# Patient Record
Sex: Male | Born: 1935 | Race: White | Hispanic: No | Smoking: Former smoker
Health system: Southern US, Community
[De-identification: ages and names within clinical notes are randomized; demographics above are authoritative.]

## PROBLEM LIST (undated history)

## (undated) DIAGNOSIS — I639 Cerebral infarction, unspecified: Secondary | ICD-10-CM

## (undated) DIAGNOSIS — I350 Nonrheumatic aortic (valve) stenosis: Secondary | ICD-10-CM

## (undated) DIAGNOSIS — IMO0002 Reserved for concepts with insufficient information to code with codable children: Secondary | ICD-10-CM

## (undated) DIAGNOSIS — R399 Unspecified symptoms and signs involving the genitourinary system: Secondary | ICD-10-CM

## (undated) DIAGNOSIS — E7800 Pure hypercholesterolemia, unspecified: Secondary | ICD-10-CM

## (undated) DIAGNOSIS — Z8673 Personal history of transient ischemic attack (TIA), and cerebral infarction without residual deficits: Secondary | ICD-10-CM

## (undated) DIAGNOSIS — Z85038 Personal history of other malignant neoplasm of large intestine: Secondary | ICD-10-CM

## (undated) DIAGNOSIS — K859 Acute pancreatitis without necrosis or infection, unspecified: Secondary | ICD-10-CM

## (undated) DIAGNOSIS — I10 Essential (primary) hypertension: Secondary | ICD-10-CM

## (undated) DIAGNOSIS — L570 Actinic keratosis: Secondary | ICD-10-CM

## (undated) DIAGNOSIS — I4891 Unspecified atrial fibrillation: Secondary | ICD-10-CM

## (undated) HISTORY — PX: CHOLECYSTECTOMY: SHX55

## (undated) HISTORY — DX: Essential (primary) hypertension: I10

## (undated) HISTORY — PX: PACEMAKER IMPLANT: EP1218

## (undated) HISTORY — PX: COLON RESECTION: SHX5231

## (undated) HISTORY — DX: Unspecified symptoms and signs involving the genitourinary system: R39.9

## (undated) HISTORY — PX: MOHS SURGERY: SUR867

## (undated) HISTORY — DX: Actinic keratosis: L57.0

## (undated) HISTORY — DX: Acute pancreatitis without necrosis or infection, unspecified: K85.90

## (undated) HISTORY — PX: ABDOMINAL SURGERY: SHX537

## (undated) HISTORY — DX: Personal history of other malignant neoplasm of large intestine: Z85.038

## (undated) HISTORY — DX: Reserved for concepts with insufficient information to code with codable children: IMO0002

## (undated) HISTORY — DX: Personal history of transient ischemic attack (TIA), and cerebral infarction without residual deficits: Z86.73

## (undated) HISTORY — PX: VALVE REPLACEMENT: SUR13

## (undated) HISTORY — DX: Nonrheumatic aortic (valve) stenosis: I35.0

---

## 2018-01-07 ENCOUNTER — Ambulatory Visit (HOSPITAL_COMMUNITY)
Admission: EM | Admit: 2018-01-07 | Discharge: 2018-01-07 | Disposition: A | Discharge status: Home / Self Care | Payer: Medicare Other | Attending: Family Medicine | Admitting: Family Medicine

## 2018-01-07 ENCOUNTER — Encounter (HOSPITAL_COMMUNITY): Payer: Self-pay | Admitting: Emergency Medicine

## 2018-01-07 DIAGNOSIS — Z5189 Encounter for other specified aftercare: Secondary | ICD-10-CM | POA: Diagnosis not present

## 2018-01-07 DIAGNOSIS — S59901A Unspecified injury of right elbow, initial encounter: Secondary | ICD-10-CM | POA: Diagnosis not present

## 2018-01-07 HISTORY — DX: Pure hypercholesterolemia, unspecified: E78.00

## 2018-01-07 HISTORY — DX: Unspecified atrial fibrillation: I48.91

## 2018-01-07 NOTE — Discharge Instructions (Signed)
Your wound is healing well. No signs on infection. Steristrips removed today. You can remove current dressing tomorrow morning. You do not have to dress it throughout the day, but can dress it at bedtime to prevent further irritation to the area. Clean area gently with soap and water, allow dried blood to fall off on own to avoid opening up the wound. Follow up here or with PCP for recheck as needed. If experiencing worsening symptoms, spreading redness, increased warmth, purulent drainage, fever, follow up for reevaluation needed.

## 2018-01-07 NOTE — ED Provider Notes (Signed)
[facility]    CSN: [id number] Arrival date & time: 01/07/18  1122     History [id number]  Chief Complaint Chief Complaint  Patient presents with  . Wound Check    HPI Andrew Soto is a 84 y.o. male.   84 year old male with history of A. fib, HLD comes in for evaluation of right elbow wound.  States a week ago, fell in the apartment, and hit the right elbow.  At that time, he went to another facility for evaluation.  Was repaired with Steri-Strips, and wrapped with coband. He has been dressing wound, but given the appearance of the wound, has been evaluated 2 more times at the same facility.  States given continued bruising around the wound, Steri-Strips, came in for evaluation.  He denies spreading erythema, increased warmth, fever.  There is soreness to the wound, but denies increase in pain.  Denies fever, chills, night sweats.     Past Medical History:  Diagnosis Date  . Atrial fibrillation (HCC)   . High cholesterol   . Stroke [facility](HCC)     There are no active problems to display for this patient.   Past Surgical History:  Procedure Laterality Date  . ABDOMINAL SURGERY    . COLON RESECTION    . PACEMAKER IMPLANT         Home Medications    Prior to Admission medications   Medication Sig Start Date End Date Taking? Authorizing Provider  AMIODARONE HCL PO Take by mouth.    [provider]  aspirin 81 MG tablet Take by mouth.    [provider]  Cholecalciferol 1000 UNIT/10ML LIQD Take by mouth.    [provider]  cyanocobalamin ([employer] VITAMIN B12) 1000 MCG tablet Take by mouth.    [provider]  ezetimibe (ZETIA) 10 MG tablet Take by mouth.    [provider]  tamsulosin (FLOMAX) 0.4 MG CAPS capsule Take by mouth.    [provider]    Family History No family history on file.  Social History Social History   Tobacco Use  . Smoking status: Never Smoker  Substance Use Topics  . Alcohol use: Yes  .  Drug use: Never     Allergies   Epinephrine and Penicillins   Review of Systems Review of Systems  Reason unable to perform ROS: See HPI as above.     Physical Exam Triage Vital Signs ED Triage Vitals  Enc Vitals Group     BP 01/07/18 1217 (!) 171/55     Pulse Rate 01/07/18 1217 60     Resp 01/07/18 1217 18     Temp 01/07/18 1217 97.9 F (36.6 C)     Temp src --      SpO2 01/07/18 1217 100 %     Weight --      Height --      Head Circumference --      Peak Flow --      Pain Score 01/07/18 1220 0     Pain Loc --      Pain Edu? --      Excl. in GC? --    No data found.  Updated Vital Signs BP (!) 171/55   Pulse 60   Temp 97.9 F (36.6 C)   Resp 18   SpO2 100%   Physical Exam  Constitutional: He is oriented to person, place, and time. He appears well-developed and well-nourished. No distress.  HENT:  Head: Normocephalic and atraumatic.  Eyes: Pupils are equal, round, and reactive to light. Conjunctivae are normal.  Musculoskeletal:  Contusion along right elbow without swelling, erythema, warmth.  There is 2 cm of dried blood with 3 Steri-Strips.  No obvious wound seen.  No tenderness to palpation.  Full range of motion of the elbow.  Neurological: He is alert and oriented to person, place, and time.  Skin: He is not diaphoretic.   UC Treatments / Results  Labs (all labs ordered are listed, but only abnormal results are displayed) Labs Reviewed - No data to display  EKG None  Radiology No results found.  Procedures Procedures (including critical care time)  Medications Ordered in UC Medications - No data to display  Initial Impression / Assessment and Plan / UC Course  I have reviewed the triage vital signs and the nursing notes.  Pertinent labs & imaging results that were available during my care of the patient were reviewed by me and considered in my medical decision making (see chart for details).    Steri-Strips removed.  No obvious wound  seen, however, discussed with patient given on blood thinners, will avoid removing dried scab, as it can start bleeding.  Surgicel applied to avoid increase in bleeding.  Patient has had discomfort with co-band, chest wound with nonadhesive gauze, and wrapped with Ace wrap.  Wound care instructions provided.  Return precautions given.  Patient expresses understanding and agrees to plan.  Final Clinical Impressions(s) / UC Diagnoses   Final diagnoses:  Visit for wound check    ED Prescriptions    None       Belinda Fisher, PA-C 01/07/18 1901

## 2018-01-07 NOTE — ED Triage Notes (Signed)
Pt states a week ago he fell at his apartment, states he is unsure how he fell, he states he had a drawer open and he fell on to his back, his R arm caught the sharp edge of the drawer and tore his skin. Pt went to [facility] for the skin tear. Pt states "its just not done well, the wrapping let the blood soak through and I got blood on my shirt". Three steri strips applied, no bleeding.

## 2018-02-02 ENCOUNTER — Ambulatory Visit (HOSPITAL_COMMUNITY)
Admission: EM | Admit: 2018-02-02 | Discharge: 2018-02-02 | Disposition: A | Discharge status: Home / Self Care | Payer: Medicare Other | Attending: Family Medicine | Admitting: Family Medicine

## 2018-02-02 ENCOUNTER — Encounter (HOSPITAL_COMMUNITY): Payer: Self-pay | Admitting: Emergency Medicine

## 2018-02-02 DIAGNOSIS — Z760 Encounter for issue of repeat prescription: Secondary | ICD-10-CM | POA: Diagnosis not present

## 2018-02-02 DIAGNOSIS — Z79899 Other long term (current) drug therapy: Secondary | ICD-10-CM | POA: Diagnosis not present

## 2018-02-02 NOTE — ED Provider Notes (Signed)
[facility]    CSN: [id number] Arrival date & time: 02/02/18  1216     History   Chief Complaint Chief Complaint  Patient presents with  . Medication Refill    HPI Andrew Soto is a 84 y.o. male.   Andrew Soto presents today for advice for medication management and establishment with a primary care provider. He moved here from [place] two months ago and is living in an independent senior living. He has a son who lives here locally. He had previously used an [facility] service. States he does not wish to pursue this option here. He had a nice experience in this urgent care when was seen for a wound check so questions if we could manage he medications. His previous doctor states if needed she can manage all but his amiodorone, which she states he needs to follow with cardiology. He has not yet established with a cardiologist. He states he has a few weeks of medications left. He has a pacemaker, hx of afib, stroke. He denies any current symptoms. He is ambulatory with a cane. He states he has bilateral knee arthritis.     ROS per HPI.      Past Medical History:  Diagnosis Date  . Atrial fibrillation (HCC)   . High cholesterol   . Stroke [facility](HCC)     There are no active problems to display for this patient.   Past Surgical History:  Procedure Laterality Date  . ABDOMINAL SURGERY    . COLON RESECTION    . PACEMAKER IMPLANT         Home Medications    Prior to Admission medications   Medication Sig Start Date End Date Taking? Authorizing Provider  AMIODARONE HCL PO Take 200 mg by mouth daily.    Yes [provider]  aspirin 81 MG tablet Take by mouth daily.    Yes [provider]  Cholecalciferol 1000 UNIT/10ML LIQD Take by mouth.   Yes [provider]  cyanocobalamin ([employer] VITAMIN B12) 1000 MCG tablet Take by mouth.   Yes [provider]  ezetimibe (ZETIA) 10 MG tablet Take by mouth daily.    Yes [provider]  tamsulosin  (FLOMAX) 0.4 MG CAPS capsule Take by mouth daily.     [provider]    Family History No family history on file.  Social History Social History   Tobacco Use  . Smoking status: Never Smoker  Substance Use Topics  . Alcohol use: Yes  . Drug use: Never     Allergies   Epinephrine and Penicillins   Review of Systems Review of Systems   Physical Exam Triage Vital Signs ED Triage Vitals  Enc Vitals Group     BP 02/02/18 1256 (!) 173/75     Pulse Rate 02/02/18 1256 60     Resp 02/02/18 1256 16     Temp 02/02/18 1256 (!) 97.4 F (36.3 C)     Temp src --      SpO2 02/02/18 1256 100 %     Weight --      Height --      Head Circumference --      Peak Flow --      Pain Score 02/02/18 1258 0     Pain Loc --      Pain Edu? --      Excl. in GC? --    No data found.  Updated Vital Signs BP (!) 173/75   Pulse 60  Temp (!) 97.4 F (36.3 C)   Resp 16   SpO2 100%    Physical Exam  Constitutional: He is oriented to person, place, and time. He appears well-developed and well-nourished.  HENT:  Head: Normocephalic and atraumatic.  Neurological: He is alert and oriented to person, place, and time.  Skin: Skin is warm and dry.  Wounds to right arm healed  Vitals reviewed.    UC Treatments / Results  Labs (all labs ordered are listed, but only abnormal results are displayed) Labs Reviewed - No data to display  EKG None  Radiology No results found.  Procedures Procedures (including critical care time)  Medications Ordered in UC Medications - No data to display  Initial Impression / Assessment and Plan / UC Course  I have reviewed the triage vital signs and the nursing notes.  Pertinent labs & imaging results that were available during my care of the patient were reviewed by me and considered in my medical decision making (see chart for details).    20 minutes spent counseling and discussing plan of care and options with patient. Not out of  meds, has a few weeks still. Options for PCP, cardiology, orthopedics provided. Patient info to care team for PCP assist. Patient verbalized understanding and agreeable to plan.   Ambulatory out of clinic without difficulty.    Final Clinical Impressions(s) / UC Diagnoses   Final diagnoses:  Medication management     Discharge Instructions     I have provided the information for the family practice clinic near here, however, there are multiple different [place] family and internal medicine practices. You may also look on the [facility] website to see if there is a clinic you prefer.  I have provided information for cardiology and orthopedists as well.  I have provided your information to our care team who will call you in the next week or so to help connect you with a primary care provider.    ED Prescriptions    None     Controlled Substance Prescriptions [place] Controlled Substance Registry consulted? Not Applicable   Georgetta Haber, NP 02/02/18 1355

## 2018-02-02 NOTE — Discharge Instructions (Signed)
I have provided the information for the family practice clinic near here, however, there are multiple different [place] family and internal medicine practices. You may also look on the [facility] website to see if there is a clinic you prefer.  I have provided information for cardiology and orthopedists as well.  I have provided your information to our care team who will call you in the next week or so to help connect you with a primary care provider.

## 2018-02-02 NOTE — ED Triage Notes (Signed)
Pt here for medication refill, pt states he is unable to find a PCP and he has one week left of one of his meds.

## 2018-02-09 ENCOUNTER — Ambulatory Visit: Payer: Self-pay | Admitting: Emergency Medicine

## 2018-02-13 ENCOUNTER — Encounter (HOSPITAL_COMMUNITY): Payer: Self-pay | Admitting: Emergency Medicine

## 2018-02-13 ENCOUNTER — Ambulatory Visit (HOSPITAL_COMMUNITY)
Admission: EM | Admit: 2018-02-13 | Discharge: 2018-02-13 | Disposition: A | Discharge status: Home / Self Care | Payer: Medicare Other | Attending: Family Medicine | Admitting: Family Medicine

## 2018-02-13 DIAGNOSIS — I4891 Unspecified atrial fibrillation: Secondary | ICD-10-CM

## 2018-02-13 DIAGNOSIS — Z760 Encounter for issue of repeat prescription: Secondary | ICD-10-CM

## 2018-02-13 MED ORDER — EZETIMIBE 10 MG PO TABS: 10.0000 mg | ORAL_TABLET | Freq: Every day | ORAL | 0 refills | Status: DC

## 2018-02-13 MED ORDER — TAMSULOSIN HCL 0.4 MG PO CAPS: 0.4000 mg | ORAL_CAPSULE | Freq: Every day | ORAL | 0 refills | Status: DC

## 2018-02-13 MED ORDER — AMIODARONE HCL 200 MG PO TABS: 200.0000 mg | ORAL_TABLET | Freq: Every day | ORAL | 0 refills | Status: DC

## 2018-02-13 NOTE — Discharge Instructions (Signed)
It was nice meeting you!

## 2018-02-13 NOTE — ED Triage Notes (Signed)
Pt requesting refills on his medication. No symptoms.

## 2018-02-13 NOTE — ED Provider Notes (Signed)
[facility]    CSN: [id number] Arrival date & time: 02/13/18  1221     History   Chief Complaint Chief Complaint  Patient presents with  . Medication Refill    HPI Andrew Soto is a 84 y.o. male.   Pt is an 84 year old male with past medical history of A. Fib, cholesterol, stroke, pacemaker.  He is here today for medication refill.  He is recently moved from [place] a few months ago and still not established a primary care provider.  He is attempted to contact his cardiologist back in [place] for refill on the amiodarone.  He uses the [facility].  He has contacted them and they are trying to fit him in between now in November before his membership runs out.  He runs out of his medications on Saturday. He is completely asymptomatic today feeling fine. Denies any chest pain, SOB, palpations. Denies any dizziness, fatigue.      Past Medical History:  Diagnosis Date  . Atrial fibrillation (HCC)   . High cholesterol   . Stroke [facility]     There are no active problems to display for this patient.   Past Surgical History:  Procedure Laterality Date  . ABDOMINAL SURGERY    . COLON RESECTION    . PACEMAKER IMPLANT         Home Medications    Prior to Admission medications   Medication Sig Start Date End Date Taking? Authorizing Provider  amiodarone (PACERONE) 200 MG tablet Take 1 tablet (200 mg total) by mouth daily. 02/13/18   Dahlia Byes A, NP  aspirin 81 MG tablet Take by mouth daily.     [provider]  Cholecalciferol 1000 UNIT/10ML LIQD Take by mouth.    [provider]  cyanocobalamin ([employer] VITAMIN B12) 1000 MCG tablet Take by mouth.    [provider]  ezetimibe (ZETIA) 10 MG tablet Take 1 tablet (10 mg total) by mouth daily. 02/13/18   Dahlia Byes A, NP  tamsulosin (FLOMAX) 0.4 MG CAPS capsule Take 1 capsule (0.4 mg total) by mouth daily. 02/13/18   Janace Aris, NP    Family History No family history on file.  Social  History Social History   Tobacco Use  . Smoking status: Never Smoker  Substance Use Topics  . Alcohol use: Yes  . Drug use: Never     Allergies   Epinephrine and Penicillins   Review of Systems Review of Systems   Physical Exam Triage Vital Signs ED Triage Vitals  Enc Vitals Group     BP 02/13/18 1240 (!) 184/74     Pulse Rate 02/13/18 1238 62     Resp 02/13/18 1238 18     Temp 02/13/18 1238 97.6 F (36.4 C)     Temp src --      SpO2 02/13/18 1238 100 %     Weight --      Height --      Head Circumference --      Peak Flow --      Pain Score 02/13/18 1238 0     Pain Loc --      Pain Edu? --      Excl. in GC? --    No data found.  Updated Vital Signs BP (!) 184/74   Pulse 62   Temp 97.6 F (36.4 C)   Resp 18   SpO2 100%   Visual Acuity Right Eye Distance:   Left  Eye Distance:   Bilateral Distance:    Right Eye Near:   Left Eye Near:    Bilateral Near:     Physical Exam  Constitutional: He is oriented to person, place, and time. He appears well-developed and well-nourished.  Very pleasant. Non toxic or ill appearing.     HENT:  Head: Normocephalic and atraumatic.  Eyes: Conjunctivae are normal.  Neck: Normal range of motion.  Cardiovascular: Normal rate, regular rhythm and normal heart sounds.  Pulmonary/Chest:  Lungs clear in all fields. No dyspnea or distress. No retractions or nasal flaring.     Musculoskeletal: Normal range of motion.  Neurological: He is alert and oriented to person, place, and time.  Skin: Skin is warm and dry. Capillary refill takes less than 2 seconds. No pallor.  Psychiatric: He has a normal mood and affect.  Nursing note and vitals reviewed.    UC Treatments / Results  Labs (all labs ordered are listed, but only abnormal results are displayed) Labs Reviewed - No data to display  EKG None  Radiology No results found.  Procedures Procedures (including critical care time)  Medications Ordered in  UC Medications - No data to display  Initial Impression / Assessment and Plan / UC Course  I have reviewed the triage vital signs and the nursing notes.  Pertinent labs & imaging results that were available during my care of the patient were reviewed by me and considered in my medical decision making (see chart for details).     Medication refill- refilled meds due to pt running out on Saturday.  amiodarone is not something we typically refill in urgent care setting. He really needs a primary care and cardiology follow up. He is aware and hopes to follow up with PCP soon and find a cardiologist.  He is very well and asymptomatic today. No concerning finds on exam.   Final Clinical Impressions(s) / UC Diagnoses   Final diagnoses:  Medication refill     Discharge Instructions     It was nice meeting you!!      ED Prescriptions    Medication Sig Dispense Auth. Provider   amiodarone (PACERONE) 200 MG tablet Take 1 tablet (200 mg total) by mouth daily. 60 tablet Yanisa Goodgame A, NP   tamsulosin (FLOMAX) 0.4 MG CAPS capsule Take 1 capsule (0.4 mg total) by mouth daily. 60 capsule Dmya Long A, NP   ezetimibe (ZETIA) 10 MG tablet Take 1 tablet (10 mg total) by mouth daily. 60 tablet Janace Aris, NP     Controlled Substance Prescriptions [place] Controlled Substance Registry consulted? no   Janace Aris, NP 02/13/18 1934

## 2018-02-22 ENCOUNTER — Ambulatory Visit: Payer: Self-pay | Admitting: Family Medicine

## 2018-03-27 ENCOUNTER — Encounter (HOSPITAL_COMMUNITY): Payer: Self-pay | Admitting: Emergency Medicine

## 2018-03-27 ENCOUNTER — Other Ambulatory Visit: Payer: Self-pay

## 2018-03-27 ENCOUNTER — Emergency Department (HOSPITAL_COMMUNITY): Payer: Medicare Other

## 2018-03-27 ENCOUNTER — Emergency Department (HOSPITAL_COMMUNITY)
Admission: EM | Admit: 2018-03-27 | Discharge: 2018-03-28 | Disposition: A | Discharge status: Home / Self Care | Payer: Medicare Other | Attending: Emergency Medicine | Admitting: Emergency Medicine

## 2018-03-27 DIAGNOSIS — S0990XA Unspecified injury of head, initial encounter: Secondary | ICD-10-CM | POA: Diagnosis present

## 2018-03-27 DIAGNOSIS — I951 Orthostatic hypotension: Secondary | ICD-10-CM | POA: Insufficient documentation

## 2018-03-27 DIAGNOSIS — W19XXXA Unspecified fall, initial encounter: Secondary | ICD-10-CM

## 2018-03-27 DIAGNOSIS — R55 Syncope and collapse: Secondary | ICD-10-CM

## 2018-03-27 DIAGNOSIS — S7002XA Contusion of left hip, initial encounter: Secondary | ICD-10-CM | POA: Diagnosis not present

## 2018-03-27 DIAGNOSIS — W101XXA Fall (on)(from) sidewalk curb, initial encounter: Secondary | ICD-10-CM | POA: Diagnosis not present

## 2018-03-27 DIAGNOSIS — S0101XA Laceration without foreign body of scalp, initial encounter: Secondary | ICD-10-CM | POA: Insufficient documentation

## 2018-03-27 DIAGNOSIS — Y929 Unspecified place or not applicable: Secondary | ICD-10-CM | POA: Diagnosis not present

## 2018-03-27 DIAGNOSIS — M25552 Pain in left hip: Secondary | ICD-10-CM

## 2018-03-27 DIAGNOSIS — Y999 Unspecified external cause status: Secondary | ICD-10-CM | POA: Diagnosis not present

## 2018-03-27 DIAGNOSIS — Y939 Activity, unspecified: Secondary | ICD-10-CM | POA: Diagnosis not present

## 2018-03-27 DIAGNOSIS — T148XXA Other injury of unspecified body region, initial encounter: Secondary | ICD-10-CM

## 2018-03-27 HISTORY — DX: Cerebral infarction, unspecified: I63.9

## 2018-03-27 MED ORDER — SODIUM CHLORIDE 0.9 % IV BOLUS
500.0000 mL | Freq: Once | INTRAVENOUS | Status: AC
  Administered 2018-03-28: 500 mL via INTRAVENOUS

## 2018-03-27 MED ORDER — SODIUM CHLORIDE 0.9 % IV SOLN
INTRAVENOUS | Status: DC
  Administered 2018-03-28: 01:00:00 via INTRAVENOUS

## 2018-03-27 MED ORDER — TAMSULOSIN HCL 0.4 MG PO CAPS
0.4000 mg | ORAL_CAPSULE | Freq: Every day | ORAL | Status: DC
  Administered 2018-03-27: 0.4 mg via ORAL
  Filled 2018-03-27: qty 1

## 2018-03-27 MED ORDER — AMIODARONE HCL 200 MG PO TABS
200.0000 mg | ORAL_TABLET | Freq: Every day | ORAL | Status: DC
  Administered 2018-03-27: 200 mg via ORAL
  Filled 2018-03-27: qty 1

## 2018-03-27 MED ORDER — EZETIMIBE 10 MG PO TABS
10.0000 mg | ORAL_TABLET | Freq: Every day | ORAL | Status: DC
  Administered 2018-03-27: 10 mg via ORAL
  Filled 2018-03-27 (×2): qty 1

## 2018-03-27 NOTE — ED Notes (Signed)
Pt remains in x-ray at this time.

## 2018-03-27 NOTE — ED Provider Notes (Addendum)
[facility] EMERGENCY DEPARTMENT Provider Note   CSN: [id number] Arrival date & time: 03/27/18  1830     History   Chief Complaint Chief Complaint  Patient presents with  . Fall  . Head Injury    HPI Andrew Soto is a 84 y.o. male.  Patient brought in by EMS.  Patient reported tripping with his walker while trying to go down a curb.  He landed to his left side and to his back.  He has some bleeding from his scalp from the left occipital area.  No bleeding upon arrival it was controlled.  Denied any loss of Consciousness.  Patient was able to ambulate after the fall.  He does have a complaint of some discomfort in the left hip area.  Patient was hypertensive by EMS at the scene.  Patient states he has not had his evening medications.  Patient's past medical history is significant for atrial fibrillation stroke and high cholesterol.  Patient is on amiodarone.  Patient is not on a blood thinner anymore.  Patient denies any neck or back pain.  Or any other extremity pain.  Patient states his tetanus is up-to-date.     Past Medical History:  Diagnosis Date  . Atrial fibrillation (HCC)   . CVA (cerebral vascular accident) (HCC)   . High cholesterol   . Stroke [facility])     There are no active problems to display for this patient.   Past Surgical History:  Procedure Laterality Date  . ABDOMINAL SURGERY    . CHOLECYSTECTOMY    . COLON RESECTION    . PACEMAKER IMPLANT          Home Medications    Prior to Admission medications   Medication Sig Start Date End Date Taking? Authorizing Provider  amiodarone (PACERONE) 200 MG tablet Take 1 tablet (200 mg total) by mouth daily. 02/13/18  Yes Bast, Traci A, NP  aspirin 81 MG tablet Take 40.5 mg by mouth daily.    Yes [provider]  Cholecalciferol (VITAMIN D3 PO) Take 1 tablet by mouth daily.   Yes [provider]  cyanocobalamin ([employer] VITAMIN B12) 1000 MCG tablet Take 1,000 mcg by mouth daily.     Yes [provider]  ezetimibe (ZETIA) 10 MG tablet Take 1 tablet (10 mg total) by mouth daily. 02/13/18  Yes Bast, Traci A, NP  tamsulosin (FLOMAX) 0.4 MG CAPS capsule Take 1 capsule (0.4 mg total) by mouth daily. 02/13/18  Yes Janace Aris, NP    Family History No family history on file.  Social History Social History   Tobacco Use  . Smoking status: Never Smoker  . Smokeless tobacco: Never Used  Substance Use Topics  . Alcohol use: Yes  . Drug use: Never     Allergies   Epinephrine and Penicillins   Review of Systems Review of Systems  Constitutional: Negative for fever.  HENT: Negative for congestion.   Eyes: Negative for visual disturbance.  Respiratory: Negative for shortness of breath.   Cardiovascular: Negative for chest pain.  Gastrointestinal: Negative for abdominal pain.  Genitourinary: Negative for dysuria.  Musculoskeletal: Negative for back pain and neck pain.  Skin: Positive for wound.  Neurological: Negative for syncope, facial asymmetry, speech difficulty, weakness and headaches.  Hematological: Does not bruise/bleed easily.  Psychiatric/Behavioral: Negative for confusion.     Physical Exam Updated Vital Signs BP (!) 141/52   Pulse 60   Temp 97.8 F (36.6 C) (Oral)   Resp  16   SpO2 95%   Physical Exam  Constitutional: He is oriented to person, place, and time. He appears well-developed and well-nourished. No distress.  HENT:  Head: Normocephalic.  Mouth/Throat: Oropharynx is clear and moist.  Left parietal occiput area with a bout a 5 mm scalp laceration no active bleeding.  Eyes: Pupils are equal, round, and reactive to light. Conjunctivae and EOM are normal.  Neck: Neck supple.  Cardiovascular: Normal rate, regular rhythm and normal heart sounds.  Pulmonary/Chest: Effort normal and breath sounds normal.  Abdominal: Soft. Bowel sounds are normal.  Musculoskeletal: Normal range of motion. He exhibits no deformity.  Elling noted  around the left hip area.  But good range of motion.  No obvious deformity.  Some increased pain with range of motion.  Neurological: He is alert and oriented to person, place, and time. No cranial nerve deficit or sensory deficit. He exhibits normal muscle tone. Coordination normal.  Skin: Skin is warm.  Nursing note and vitals reviewed.    ED Treatments / Results  Labs (all labs ordered are listed, but only abnormal results are displayed) Labs Reviewed  CBC WITH DIFFERENTIAL/PLATELET - Abnormal; Notable for the following components:      Result Value   WBC 10.8 (*)    Neutro Abs 8.6 (*)    Abs Immature Granulocytes 0.14 (*)    All other components within normal limits  COMPREHENSIVE METABOLIC PANEL - Abnormal; Notable for the following components:   Glucose, Bld 111 (*)    Total Protein 5.9 (*)    All other components within normal limits  URINALYSIS, ROUTINE W REFLEX MICROSCOPIC  I-STAT TROPONIN, ED    EKG EKG Interpretation  Date/Time:  Monday March 27 2018 23:56:35 EDT Ventricular Rate:  60 PR Interval:    QRS Duration: 156 QT Interval:  485 QTC Calculation: 485 R Axis:   -68 Text Interpretation:  Sinus rhythm Prolonged PR interval Probable left atrial enlargement Left bundle branch block Baseline wander in lead(s) V3 suspect paced rhythm Confirmed by Vanetta Mulders [phone]) on 03/28/2018 12:07:05 AM   Radiology Dg Chest 2 View  Result Date: 03/27/2018 CLINICAL DATA:  Near syncope with chest pain EXAM: CHEST - 2 VIEW COMPARISON:  None. FINDINGS: Left-sided pacing device and vascular stent. No focal opacity or pleural effusion. Heart size upper normal. Aortic atherosclerosis. No pneumothorax. IMPRESSION: No active cardiopulmonary disease. Electronically Signed   By: Jasmine Pang M.D.   On: 03/27/2018 23:29   Ct Head Wo Contrast  Result Date: 03/27/2018 CLINICAL DATA:  Trip and fall.  Head laceration.  Hypertensive. EXAM: CT HEAD WITHOUT CONTRAST TECHNIQUE:  Contiguous axial images were obtained from the base of the skull through the vertex without intravenous contrast. COMPARISON:  None. FINDINGS: BRAIN: No intraparenchymal hemorrhage, mass effect nor midline shift. The ventricles and sulci are normal for age. Patchy supratentorial white matter hypodensities within normal range for patient's age, though non-specific are most compatible with chronic small vessel ischemic disease. LEFT greater than RIGHT parietal lobe encephalomalacia. Confluent no acute large vascular territory infarcts. No abnormal extra-axial fluid collections. Basal cisterns are patent. VASCULAR: Severe calcific atherosclerosis of the carotid siphons. SKULL: No skull fracture. Small LEFT frontoparietal scalp hematoma without subcutaneous gas or radiopaque foreign bodies. SINUSES/ORBITS: Trace paranasal sinus mucosal thickening. Mastoid air cells are well aerated.The included ocular globes and orbital contents are non-suspicious. Status post bilateral ocular lens implants. OTHER: None. IMPRESSION: 1. No acute intracranial process.  Moderate LEFT scalp hematoma. 2. Old parietal  lobe infarcts. 3. Moderate chronic small vessel ischemic changes. Electronically Signed   By: Awilda Metro M.D.   On: 03/27/2018 21:05   Ct Hip Left Wo Contrast  Result Date: 03/27/2018 CLINICAL DATA:  Left hip pain after fall on sidewalk. EXAM: CT OF THE LEFT HIP WITHOUT CONTRAST TECHNIQUE: Multidetector CT imaging of the left hip was performed according to the standard protocol. Multiplanar CT image reconstructions were also generated. COMPARISON:  Radiographs earlier this day. FINDINGS: Bones/Joint/Cartilage Cortical margins of the left hip are intact. No evidence of acute fracture. Minor hip joint space narrowing and acetabular spurring. No hip joint effusion. Pubic rami are intact. Ligaments Suboptimally assessed by CT. Muscles and Tendons Subcutaneous contusion lateral to the gluteus musculature without definite  muscular hematoma. Soft tissues Subcutaneous soft tissue hematoma about the lateral to hip located in the subcutaneous fat. Hematoma spans 3.1 x 5 x 11.1 cm with adjacent surrounding stranding. There are vascular calcifications of included pelvic vasculature. Large volume of stool in the colon. IMPRESSION: 1. Negative for left hip fracture. 2. Large subcutaneous hematoma about the lateral hip. Electronically Signed   By: Narda Rutherford M.D.   On: 03/27/2018 23:59   Dg Hips Bilat W Or Wo Pelvis 3-4 Views  Result Date: 03/27/2018 CLINICAL DATA:  Left hip pain after fall on the sidewalk. EXAM: DG HIP (WITH OR WITHOUT PELVIS) 3-4V BILAT COMPARISON:  None. FINDINGS: The cortical margins of the bony pelvis and left hip are intact. Pubic rami are intact. No fracture. Pubic symphysis and sacroiliac joints are congruent. Both femoral heads are well-seated in the respective acetabula. Mild acetabular spurring likely normal for age. There are vascular calcifications and probable bi-iliac stents. Catheter in the right mid abdomen may be a biliary stent, partially included. Surgical clips adjacent to the right acetabulum. Vascular calcifications. IMPRESSION: No fracture or dislocation of the pelvis or left hip. Electronically Signed   By: Narda Rutherford M.D.   On: 03/27/2018 21:15    Procedures Procedures (including critical care time)  Medications Ordered in ED Medications  amiodarone (PACERONE) tablet 200 mg (200 mg Oral Given 03/27/18 2143)  tamsulosin (FLOMAX) capsule 0.4 mg (0.4 mg Oral Given 03/27/18 2144)  ezetimibe (ZETIA) tablet 10 mg (10 mg Oral Given 03/27/18 2146)  0.9 %  sodium chloride infusion ( Intravenous New Bag/Given 03/28/18 0050)  sodium chloride 0.9 % bolus 500 mL (0 mLs Intravenous Stopped 03/28/18 0049)     Initial Impression / Assessment and Plan / ED Course  I have reviewed the triage vital signs and the nursing notes.  Pertinent labs & imaging results that were available  during my care of the patient were reviewed by me and considered in my medical decision making (see chart for details).    Patient's initial evaluation was just for the tripping and fall that resulted in on him hitting the back of his head and landing on his left hip area.  Patient had no syncopal episode had no loss of consciousness tetanus was up-to-date.  Head CT was negative for any acute injuries.  Patient's had an old stroke with some left-sided residual deficits.  Also patient had x-rays of both hips and pelvis which were negative.  Patient has a history of pacemaker.  Patiently recently moved to this area from [place].  He came in with his son.  Patient lives in an assisted living home.  Patient was discharged since these things were negative and he seemed fine.  When we got patient up  he complained a lot about his left hip there was an increased size and the hematoma that was there.  There was some question whether there may be was a dislocation.  He was having difficulty ambulating even though he was ambulating at the scene.  Patient became near syncopal.  We had to set him down in a wheelchair and move him back into the bed.  Family got additional information from the son that these near syncopal episodes have been ongoing for the approximately the past year and has not been thoroughly evaluated because of the transition to this area.  I witnessed the near syncopal episode.  I think if patient had not been laid down he would have passed out completely.  Repeat EKG showed no significant changes it appears he is got a paced rhythm.  Patient underwent CT of the left hip which showed no bony abnormality but did show soft tissue hematoma.  This was the mass that were feeling.  Patient was laying down but is asymptomatic.  Patient now will have labs and patient will have his pacemaker interrogated.  Patient will then need to be reassessed and stood up if this reoccurs he may require admission.  If  patient unable to ambulate may require evaluation physical therapy for placement in the rehab facility.  Update: Patient's troponin negative CBC without significant anemia.  Chest x-ray negative.   Final Clinical Impressions(s) / ED Diagnoses   Final diagnoses:  Fall, initial encounter  Injury of head, initial encounter  Hip pain, acute, left  Hematoma  Near syncope    ED Discharge Orders    None       Vanetta Mulders, MD 03/28/18 1610    Vanetta Mulders, MD 03/28/18 9604    Vanetta Mulders, MD 03/28/18 [phone]

## 2018-03-27 NOTE — ED Triage Notes (Signed)
Pt to ED via [facility] after reported tripping and falling.  Pt has lac to side of head.  No bleeding at this time.  Pt denies LOC.  Pt was able to ambulate after the fall.  Pt was hypertensive on EMS arrival on scene.

## 2018-03-27 NOTE — ED Notes (Signed)
After pt was discharged and was walking to wheelchair with his walker.  Pt became very pale and would not answer questions.  Pt was helped down into wheelchair and placed back onto stretcher.

## 2018-03-27 NOTE — Discharge Instructions (Addendum)
Wound care as we discussed.  Can wash your head with soap and water.  CT of head without any acute findings no injury of brain or skull.  CT of the hip shows a soft tissue hematoma.  Follow-up with your doctor in the next week or 2.  Return for any new or worse symptoms.

## 2018-03-28 DIAGNOSIS — S0990XA Unspecified injury of head, initial encounter: Secondary | ICD-10-CM | POA: Diagnosis not present

## 2018-03-28 LAB — COMPREHENSIVE METABOLIC PANEL
ALK PHOS: 64 U/L (ref 38–126)
ALT: 16 U/L (ref 0–44)
AST: 27 U/L (ref 15–41)
Albumin: 3.5 g/dL (ref 3.5–5.0)
Anion gap: 6 (ref 5–15)
BILIRUBIN TOTAL: 0.9 mg/dL (ref 0.3–1.2)
BUN: 15 mg/dL (ref 8–23)
CALCIUM: 9.3 mg/dL (ref 8.9–10.3)
CO2: 25 mmol/L (ref 22–32)
CREATININE: 1.09 mg/dL (ref 0.61–1.24)
Chloride: 105 mmol/L (ref 98–111)
GFR calc Af Amer: >60 mL/min (ref >60)
Glucose, Bld: 111 mg/dL — ABNORMAL HIGH (ref 70–99)
POTASSIUM: 3.9 mmol/L (ref 3.5–5.1)
Sodium: 136 mmol/L (ref 135–145)
TOTAL PROTEIN: 5.9 g/dL — AB (ref 6.5–8.1)

## 2018-03-28 LAB — CBC WITH DIFFERENTIAL/PLATELET
Abs Immature Granulocytes: 0.14 10*3/uL — ABNORMAL HIGH (ref 0.00–0.07)
Basophils Absolute: 0 10*3/uL (ref 0.0–0.1)
Basophils Relative: 0 %
EOS ABS: 0 10*3/uL (ref 0.0–0.5)
EOS PCT: 0 %
HEMATOCRIT: 42 % (ref 39.0–52.0)
Hemoglobin: 13.7 g/dL (ref 13.0–17.0)
Immature Granulocytes: 1 %
LYMPHS ABS: 1 10*3/uL (ref 0.7–4.0)
Lymphocytes Relative: 9 %
MCH: 31 pg (ref 26.0–34.0)
MCHC: 32.6 g/dL (ref 30.0–36.0)
MCV: 95 fL (ref 80.0–100.0)
MONOS PCT: 10 %
Monocytes Absolute: 1 10*3/uL (ref 0.1–1.0)
Neutro Abs: 8.6 10*3/uL — ABNORMAL HIGH (ref 1.7–7.7)
Neutrophils Relative %: 80 %
Platelets: 190 10*3/uL (ref 150–400)
RBC: 4.42 MIL/uL (ref 4.22–5.81)
RDW: 13.8 % (ref 11.5–15.5)
WBC: 10.8 10*3/uL — ABNORMAL HIGH (ref 4.0–10.5)
nRBC: 0 % (ref 0.0–0.2)

## 2018-03-28 LAB — URINALYSIS, ROUTINE W REFLEX MICROSCOPIC
BILIRUBIN URINE: NEGATIVE
GLUCOSE, UA: NEGATIVE mg/dL
HGB URINE DIPSTICK: NEGATIVE
Ketones, ur: NEGATIVE mg/dL
Leukocytes, UA: NEGATIVE
NITRITE: NEGATIVE
PH: 7 (ref 5.0–8.0)
Protein, ur: NEGATIVE mg/dL
Specific Gravity, Urine: 1.011 (ref 1.005–1.030)

## 2018-03-28 LAB — I-STAT TROPONIN, ED: TROPONIN I, POC: 0.02 ng/mL (ref 0.00–0.08)

## 2018-03-28 MED ORDER — ACETAMINOPHEN 500 MG PO TABS
1000.0000 mg | ORAL_TABLET | Freq: Once | ORAL | Status: AC
  Administered 2018-03-28: 1000 mg via ORAL
  Filled 2018-03-28: qty 2

## 2018-03-28 MED ORDER — SODIUM CHLORIDE 0.9 % IV BOLUS (SEPSIS)
1000.0000 mL | Freq: Once | INTRAVENOUS | Status: AC
  Administered 2018-03-28: 1000 mL via INTRAVENOUS

## 2018-03-28 MED ORDER — HYDROCODONE-ACETAMINOPHEN 5-325 MG PO TABS: 1.0000 | ORAL_TABLET | Freq: Four times a day (QID) | ORAL | 0 refills | Status: DC | PRN

## 2018-03-28 NOTE — ED Notes (Signed)
Ambulated Pt  To Nurse station and back. Pt stated his hip pain is better. Pt walked with his walker but needed no assistance from this tech. Pt back in bed.

## 2018-03-28 NOTE — ED Notes (Signed)
Pt reports vicodin makes him dizzy and lightheaded. Prescription shredded, as pt did not want the script. Instructed pt to take tylenol as needed for headache at home

## 2018-03-28 NOTE — ED Provider Notes (Addendum)
2:30 AM  Assumed care.  Pt is a 84 y.o. male with history of previous CVA with left-sided weakness, atrial fibrillation, pacemaker who lives in assisted living facility and uses a walker at baseline who had a mechanical fall today onto his left side.  CT of the head showed scalp hematoma.  CT of the hip showed hematoma with no fracture.  He is not on blood thinners.  Patient had a near syncopal episode with standing here.  It appears he is orthostatic.  We will continue to hydrate patient.  His pacemaker has been interrogated and shows no acute events.  He denies any chest pain or shortness of breath.  No recent infectious symptoms.  No vomiting or diarrhea.  Reports he has been eating and drinking well.   4:30 AM  Pt's orthostats have resolved after IV hydration.  Suspect this was the cause of his near syncopal event.  He is able to stand and ambulate without assistance.  I feel he is safe to be discharged.  Will discharge with pain medication for his hip hematoma.  We will have him follow-up with primary care physician as an outpatient.  At this time, I do not feel there is any life-threatening condition present. I have reviewed and discussed all results (EKG, imaging, lab, urine as appropriate) and exam findings with patient/family. I have reviewed nursing notes and appropriate previous records.  I feel the patient is safe to be discharged home without further emergent workup and can continue workup as an outpatient as needed. Discussed usual and customary return precautions. Patient/family verbalize understanding and are comfortable with this plan.  Outpatient follow-up has been provided if needed. All questions have been answered.     Aubri Gathright, Layla Maw, DO 03/28/18 [phone]

## 2018-03-30 ENCOUNTER — Emergency Department (INDEPENDENT_AMBULATORY_CARE_PROVIDER_SITE_OTHER): Payer: Medicare Other

## 2018-03-30 ENCOUNTER — Emergency Department (INDEPENDENT_AMBULATORY_CARE_PROVIDER_SITE_OTHER)
Admission: EM | Admit: 2018-03-30 | Discharge: 2018-03-30 | Disposition: A | Discharge status: Home / Self Care | Payer: Medicare Other | Source: Home / Self Care | Attending: Family Medicine | Admitting: Family Medicine

## 2018-03-30 ENCOUNTER — Encounter: Payer: Self-pay | Admitting: *Deleted

## 2018-03-30 DIAGNOSIS — R0602 Shortness of breath: Secondary | ICD-10-CM

## 2018-03-30 DIAGNOSIS — R0781 Pleurodynia: Secondary | ICD-10-CM

## 2018-03-30 DIAGNOSIS — R079 Chest pain, unspecified: Secondary | ICD-10-CM

## 2018-03-30 DIAGNOSIS — I700 Atherosclerosis of aorta: Secondary | ICD-10-CM

## 2018-03-30 NOTE — ED Provider Notes (Signed)
[name]    CSN: [id number] Arrival date & time: 03/30/18  4098     History   Chief Complaint Chief Complaint  Patient presents with  . Chest Pain    HPI Andrew Soto is a 84 y.o. male.   HPI  Andrew Soto is a 84 y.o. male presenting to UC with c/o gradually worsening Left sided chest/rib pain after trip and fall, hitting his walker 3 days ago. He was seen in the ED initially after the fall and had a normal CT of his Head, Left hip and x-rays of his Left hip and chest.  Pain is intermittent, sharp and "catches" with cough or deep inspiration. He has not noticed any bruising on that side. Denies SOB.    Past Medical History:  Diagnosis Date  . Atrial fibrillation (HCC)   . CVA (cerebral vascular accident) (HCC)   . High cholesterol   . Stroke [facility])     There are no active problems to display for this patient.   Past Surgical History:  Procedure Laterality Date  . ABDOMINAL SURGERY    . CHOLECYSTECTOMY    . COLON RESECTION    . PACEMAKER IMPLANT         Home Medications    Prior to Admission medications   Medication Sig Start Date End Date Taking? Authorizing Provider  amiodarone (PACERONE) 200 MG tablet Take 1 tablet (200 mg total) by mouth daily. 02/13/18   Dahlia Byes A, NP  aspirin 81 MG tablet Take 40.5 mg by mouth daily.     [provider]  Cholecalciferol (VITAMIN D3 PO) Take 1 tablet by mouth daily.    [provider]  cyanocobalamin ([employer] VITAMIN B12) 1000 MCG tablet Take 1,000 mcg by mouth daily.     [provider]  ezetimibe (ZETIA) 10 MG tablet Take 1 tablet (10 mg total) by mouth daily. 02/13/18   Dahlia Byes A, NP  tamsulosin (FLOMAX) 0.4 MG CAPS capsule Take 1 capsule (0.4 mg total) by mouth daily. 02/13/18   Janace Aris, NP    Family History History reviewed. No pertinent family history.  Social History Social History   Tobacco Use  . Smoking status: Never Smoker  . Smokeless tobacco: Never Used    Substance Use Topics  . Alcohol use: Yes  . Drug use: Never     Allergies   Epinephrine and Penicillins   Review of Systems Review of Systems  Constitutional: Negative for chills and fever.  HENT: Positive for congestion (minimal). Negative for sore throat.   Respiratory: Positive for cough. Negative for shortness of breath.   Cardiovascular: Positive for chest pain. Negative for palpitations and leg swelling.  Skin: Negative for color change ( no bruising to chest area where there is pain) and wound.     Physical Exam Triage Vital Signs ED Triage Vitals  Enc Vitals Group     BP 03/30/18 0951 (!) 148/61     Pulse Rate 03/30/18 0951 66     Resp 03/30/18 0951 16     Temp --      Temp src --      SpO2 03/30/18 0951 100 %     Weight 03/30/18 0953 147 lb (66.7 kg)     Height --      Head Circumference --      Peak Flow --      Pain Score --      Pain Loc --  Pain Edu? --      Excl. in GC? --    No data found.  Updated Vital Signs BP (!) 148/61 (BP Location: Right Arm)   Pulse 66   Resp 16   Wt 147 lb (66.7 kg)   SpO2 100%   Visual Acuity Right Eye Distance:   Left Eye Distance:   Bilateral Distance:    Right Eye Near:   Left Eye Near:    Bilateral Near:     Physical Exam  Constitutional: He is oriented to person, place, and time. He appears well-developed and well-nourished.  Non-toxic appearance. He does not appear ill. No distress.  HENT:  Head: Normocephalic and atraumatic.  Eyes: EOM are normal.  Neck: Normal range of motion. Neck supple.  Cardiovascular: Normal rate and regular rhythm.  Pulmonary/Chest: Effort normal and breath sounds normal. He has no decreased breath sounds. He has no wheezes. He has no rhonchi. He has no rales.    Musculoskeletal: Normal range of motion.  Neurological: He is alert and oriented to person, place, and time.  Skin: Skin is warm and dry.  Psychiatric: He has a normal mood and affect. His behavior is normal.   Nursing note and vitals reviewed.    UC Treatments / Results  Labs (all labs ordered are listed, but only abnormal results are displayed) Labs Reviewed - No data to display  EKG None  Radiology Dg Chest 2 View  Result Date: 03/30/2018 CLINICAL DATA:  Shortness of breath, chest pain. EXAM: CHEST - 2 VIEW COMPARISON:  Radiographs of March 27, 2018. FINDINGS: Stable cardiomediastinal silhouette. Left-sided pacemaker is unchanged in position. Atherosclerosis of thoracic aorta is noted. No pneumothorax or pleural effusion is noted. Both lungs are clear. The visualized skeletal structures are unremarkable. IMPRESSION: No active cardiopulmonary disease. Aortic Atherosclerosis (ICD10-I70.0). Electronically Signed   By: Lupita Raider, M.D.   On: 03/30/2018 10:42    Procedures Procedures (including critical care time)  Medications Ordered in UC Medications - No data to display  Initial Impression / Assessment and Plan / UC Course  I have reviewed the triage vital signs and the nursing notes.  Pertinent labs & imaging results that were available during my care of the patient were reviewed by me and considered in my medical decision making (see chart for details).     Reassured pt and his son, no abnormalities seen on imaging Encouraged conservative treatment  Final Clinical Impressions(s) / UC Diagnoses   Final diagnoses:  Rib pain on left side     Discharge Instructions      You do not have any broken ribs, bronchitis or pneumonia.  The pain should slowly improve over the next few days.  You may alternate cool and warm compresses to help with the pain and continue to take the Tylenol Extra Strength.  Please follow up with family medicine next week if not improving, especially if symptoms are worsening.     ED Prescriptions    None     Controlled Substance Prescriptions [place] Controlled Substance Registry consulted? Not Applicable   Rolla Plate 03/30/18  1429

## 2018-03-30 NOTE — Discharge Instructions (Signed)
°  You do not have any broken ribs, bronchitis or pneumonia.  The pain should slowly improve over the next few days.  You may alternate cool and warm compresses to help with the pain and continue to take the Tylenol Extra Strength.  Please follow up with family medicine next week if not improving, especially if symptoms are worsening.

## 2018-03-30 NOTE — ED Triage Notes (Signed)
Patient reports losing his balance and falling 3 days ago. He was seen at [facility] ED. CT head, chest x-ray, left hip negative. C/o cough x yesterday and left chest soreness. No bruise noted. Reports when he fell his walker hit him in the chest.

## 2018-04-02 ENCOUNTER — Telehealth: Payer: Self-pay | Admitting: Emergency Medicine

## 2018-04-02 NOTE — Telephone Encounter (Signed)
Message left on voice mail inquiring about patient's status and encouraging patient to call with questions/concerns.  

## 2018-04-13 ENCOUNTER — Ambulatory Visit (INDEPENDENT_AMBULATORY_CARE_PROVIDER_SITE_OTHER): Payer: Medicare Other | Admitting: Physician Assistant

## 2018-04-13 ENCOUNTER — Encounter: Payer: Self-pay | Admitting: Physician Assistant

## 2018-04-13 VITALS — BP 192/61 | HR 68 | Resp 16 | Ht 67.0 in | Wt 153.0 lb

## 2018-04-13 DIAGNOSIS — Z85038 Personal history of other malignant neoplasm of large intestine: Secondary | ICD-10-CM

## 2018-04-13 DIAGNOSIS — IMO0002 Reserved for concepts with insufficient information to code with codable children: Secondary | ICD-10-CM

## 2018-04-13 DIAGNOSIS — Z8673 Personal history of transient ischemic attack (TIA), and cerebral infarction without residual deficits: Secondary | ICD-10-CM

## 2018-04-13 DIAGNOSIS — M6281 Muscle weakness (generalized): Secondary | ICD-10-CM

## 2018-04-13 DIAGNOSIS — R269 Unspecified abnormalities of gait and mobility: Secondary | ICD-10-CM

## 2018-04-13 DIAGNOSIS — R399 Unspecified symptoms and signs involving the genitourinary system: Secondary | ICD-10-CM

## 2018-04-13 DIAGNOSIS — K859 Acute pancreatitis without necrosis or infection, unspecified: Secondary | ICD-10-CM | POA: Insufficient documentation

## 2018-04-13 DIAGNOSIS — I4891 Unspecified atrial fibrillation: Secondary | ICD-10-CM

## 2018-04-13 DIAGNOSIS — Z7689 Persons encountering health services in other specified circumstances: Secondary | ICD-10-CM

## 2018-04-13 DIAGNOSIS — I10 Essential (primary) hypertension: Secondary | ICD-10-CM

## 2018-04-13 DIAGNOSIS — Z9181 History of falling: Secondary | ICD-10-CM

## 2018-04-13 DIAGNOSIS — Z950 Presence of cardiac pacemaker: Secondary | ICD-10-CM

## 2018-04-13 DIAGNOSIS — I69398 Other sequelae of cerebral infarction: Secondary | ICD-10-CM

## 2018-04-13 MED ORDER — VALSARTAN 40 MG PO TABS: 40.0000 mg | ORAL_TABLET | ORAL | 0 refills | Status: DC

## 2018-04-13 MED ORDER — EZETIMIBE 10 MG PO TABS: 10.0000 mg | ORAL_TABLET | Freq: Every day | ORAL | 1 refills | Status: DC

## 2018-04-13 MED ORDER — AMIODARONE HCL 200 MG PO TABS: 200.0000 mg | ORAL_TABLET | Freq: Every day | ORAL | 1 refills | Status: DC

## 2018-04-13 MED ORDER — TAMSULOSIN HCL 0.4 MG PO CAPS: 0.4000 mg | ORAL_CAPSULE | Freq: Every day | ORAL | 1 refills | Status: DC

## 2018-04-13 NOTE — Progress Notes (Signed)
HPI:                                                                Andrew Soto. is a 84 y.o. male who presents to [facility]: Primary Care Sports Medicine today to establish care  Patient is accompanied by his son today.  He recently relocated from [place], [place] in July of this year.  PMH of colon cancer, CVA w/left-sided residual weakness, HTN, Afib, pacemaker in place, recurrent pancreatitis, and recent fall  He was seen in the ED on 03/27/18 for fall 2/2 orthostasis. He has baseline left-sided weakness, ambulates with a walker. CTH was negative as was cardiac work-up for near syncope. He was discharged home. He has not had any additional falls since that time, but is concerned about his weakness.     Depression screen PHQ 2/9 04/13/2018  Decreased Interest 0  Down, Depressed, Hopeless 1  PHQ - 2 Score 1  Altered sleeping 0  Tired, decreased energy 0  Change in appetite 0  Feeling bad or failure about yourself  1  Trouble concentrating 0  Moving slowly or fidgety/restless 0  Suicidal thoughts 1  PHQ-9 Score 3    GAD 7 : Generalized Anxiety Score 04/13/2018  Nervous, Anxious, on Edge 0  Control/stop worrying 0  Worry too much - different things 0  Trouble relaxing 0  Restless 0  Easily annoyed or irritable 1  Afraid - awful might happen 0  Total GAD 7 Score 1      Past Medical History:  Diagnosis Date  . Actinic keratoses   . Atrial fibrillation (HCC)   . CVA (cerebral vascular accident) (HCC)   . High cholesterol   . History of colon cancer   . History of CVA (cerebrovascular accident)   . Hypertension   . Lower urinary tract symptoms (LUTS)   . Pancreatitis, recurrent   . Stroke [facility])    Past Surgical History:  Procedure Laterality Date  . ABDOMINAL SURGERY    . CHOLECYSTECTOMY    . COLON RESECTION    . MOHS SURGERY     Right eyebrow  . PACEMAKER IMPLANT    . VALVE REPLACEMENT     Social History   Tobacco Use  . Smoking  status: Former Smoker    Packs/day: 1.00    Years: 5.00    Pack years: 5.00    Types: Cigarettes  . Smokeless tobacco: Never Used  Substance Use Topics  . Alcohol use: Yes   family history is not on file.    ROS: Review of Systems  Respiratory: Positive for cough and shortness of breath.   Cardiovascular: Positive for chest pain.  Gastrointestinal: Positive for constipation.  Musculoskeletal: Positive for falls and joint pain.  Endo/Heme/Allergies: Bruises/bleeds easily.  Psychiatric/Behavioral: Positive for depression. The patient is nervous/anxious.   All other systems reviewed and are negative.    Medications: Current Outpatient Medications  Medication Sig Dispense Refill  . amiodarone (PACERONE) 200 MG tablet Take 1 tablet (200 mg total) by mouth daily. 90 tablet 1  . aspirin 81 MG tablet Take 40.5 mg by mouth daily.     . Cholecalciferol (VITAMIN D3 PO) Take 1 tablet by mouth daily.    . cyanocobalamin (CVS VITAMIN  B12) 1000 MCG tablet Take 1,000 mcg by mouth daily.     Marland Kitchen ezetimibe (ZETIA) 10 MG tablet Take 1 tablet (10 mg total) by mouth daily. 90 tablet 1  . tamsulosin (FLOMAX) 0.4 MG CAPS capsule Take 1 capsule (0.4 mg total) by mouth daily. 90 capsule 1  . valsartan (DIOVAN) 40 MG tablet Take 1 tablet (40 mg total) by mouth every morning. 90 tablet 0   No current facility-administered medications for this visit.    Allergies  Allergen Reactions  . Epinephrine Other (See Comments)    Dizziness/double vision  . Penicillins Rash       Objective:  BP (!) 192/61   Pulse 68   Resp 16   Ht 5\' 7"  (1.702 m)   Wt 153 lb (69.4 kg)   SpO2 99%   BMI 23.96 kg/m  Gen:  alert, not ill-appearing, no distress, appropriate for age HEENT: head normocephalic without obvious abnormality, conjunctiva and cornea clear, trachea midline Pulm: Normal work of breathing, normal phonation, clear to auscultation bilaterally, no wheezes, rales or rhonchi CV: Normal rate, regular  rhythm, s1 and s2 distinct, no murmurs, clicks or rubs  Neuro: alert and oriented x 3, no tremor MSK: extremities atraumatic, ambulating with walker Skin: intact, no rashes on exposed skin, no jaundice, no cyanosis Psych: well-groomed, cooperative, good eye contact, euthymic mood, affect mood-congruent, speech is articulate, and thought processes clear and goal-directed  Lab Results  Component Value Date   CREATININE 1.09 03/28/2018   BUN 15 03/28/2018   NA 136 03/28/2018   K 3.9 03/28/2018   CL 105 03/28/2018   CO2 25 03/28/2018   Lab Results  Component Value Date   WBC 10.8 (H) 03/28/2018   HGB 13.7 03/28/2018   HCT 42.0 03/28/2018   MCV 95.0 03/28/2018   PLT 190 03/28/2018     No results found for this or any previous visit (from the past 72 hour(s)). No results found.    Assessment and Plan: 84 y.o. male with   .Andrew Soto was seen today for establish care.  Diagnoses and all orders for this visit:  Encounter to establish care  Lower urinary tract symptoms (LUTS) -     tamsulosin (FLOMAX) 0.4 MG CAPS capsule; Take 1 capsule (0.4 mg total) by mouth daily.  Pancreatitis, recurrent  History of CVA (cerebrovascular accident) -     ezetimibe (ZETIA) 10 MG tablet; Take 1 tablet (10 mg total) by mouth daily.  History of colon cancer Comments: 13 years ago, he underwent multiple surgeries, but did not have to do any chemotherapy. He is no longer doing colonoscopy. "I decided I didn't need to do this a  Atrial fibrillation, unspecified type (HCC) -     amiodarone (PACERONE) 200 MG tablet; Take 1 tablet (200 mg total) by mouth daily. -     Ambulatory referral to Cardiology  Pacemaker  Hypertension goal BP (blood pressure) < 140/90 -     valsartan (DIOVAN) 40 MG tablet; Take 1 tablet (40 mg total) by mouth every morning.  History of fall -     Ambulatory referral to Physical Therapy  Gait disturbance, post-stroke -     Ambulatory referral to Physical  Therapy  Muscle weakness of lower extremity -     Ambulatory referral to Physical Therapy   - Personally reviewed PMH, PSH, PFH, medications, allergies, HM - Age-appropriate cancer screening: declines surveillance colonoscopy - Personally reviewed immunizations, UTD - PHQ2 negative - Personally reviewed recent labs - Refills  provided as above  Uncontrolled HTN: BP Readings from Last 3 Encounters:  04/13/18 (!) 192/61  03/30/18 (!) 148/61  03/28/18 (!) 145/53  asymptomatic, patient is paced and on Amiodarone Adding low-dose ARB Monitor closely for orthostasis/hypotension Keep f/u with Dr. Jens Som on 11/20  History of fall, Gait disturbance post-CVA: Falls prevention discussed Home health order placed for PT     Patient education and anticipatory guidance given Patient agrees with treatment plan Follow-up in 3 months or sooner as needed if symptoms worsen or fail to improve  Levonne Hubert PA-C

## 2018-04-13 NOTE — Patient Instructions (Addendum)
For your blood pressure: - Goal <140/90  - take your blood pressure medication in the morning (unless instructed differently) - take baby aspirin 81 mg daily to help prevent heart attack/stroke - monitor and log blood pressures at home - check around the same time each day in a relaxed setting - Limit salt to <2500 mg/day - Follow DASH (Dietary Approach to Stopping Hypertension) eating plan - Try to get at least 150 minutes of aerobic exercise per week - Aim to go on a brisk walk 30 minutes per day at least 5 days per week. If you're not active, gradually increase how long you walk by 5 minutes each week - limit alcohol: 2 standard drinks per day for men and 1 per day for women - avoid tobacco/nicotine products. Consider smoking cessation if you smoke - weight loss: 7% of current body weight can reduce your blood pressure by 5-10 points - follow-up at least every 6 months for your blood pressure. Follow-up sooner if your BP is not controlled

## 2018-04-19 ENCOUNTER — Ambulatory Visit (INDEPENDENT_AMBULATORY_CARE_PROVIDER_SITE_OTHER): Payer: Medicare Other | Admitting: Physical Therapy

## 2018-04-19 ENCOUNTER — Encounter: Payer: Self-pay | Admitting: Physical Therapy

## 2018-04-19 DIAGNOSIS — Z9181 History of falling: Secondary | ICD-10-CM | POA: Diagnosis not present

## 2018-04-19 DIAGNOSIS — I69354 Hemiplegia and hemiparesis following cerebral infarction affecting left non-dominant side: Secondary | ICD-10-CM

## 2018-04-19 DIAGNOSIS — R2681 Unsteadiness on feet: Secondary | ICD-10-CM | POA: Diagnosis not present

## 2018-04-19 DIAGNOSIS — M6281 Muscle weakness (generalized): Secondary | ICD-10-CM | POA: Diagnosis not present

## 2018-04-19 NOTE — Therapy (Signed)
[facility] [place] [address] [place], [place], [address] Phone: [phone]   Fax:  [phone]  Physical Therapy Evaluation  Patient Details  Name: Andrew Soto. MRN: [id number] Date of Birth: 1931/06/09 Referring Provider (PT): Carlis Stable, [place]   Encounter Date: 04/19/2018  PT End of Session - 04/19/18 1228    Visit Number  1    Number of Visits  16    Date for PT Re-Evaluation  06/14/18    Authorization Type  Medicare/BCBS    PT Start Time  1030    PT Stop Time  1110    PT Time Calculation (min)  40 min    Equipment Utilized During Treatment  Gait belt    Activity Tolerance  Patient tolerated treatment well    Behavior During Therapy  WFL for tasks assessed/performed       Past Medical History:  Diagnosis Date  . Actinic keratoses   . Atrial fibrillation (HCC)   . CVA (cerebral vascular accident) (HCC)   . High cholesterol   . History of colon cancer   . History of CVA (cerebrovascular accident)   . Hypertension   . Lower urinary tract symptoms (LUTS)   . Pancreatitis, recurrent   . Stroke [facility])     Past Surgical History:  Procedure Laterality Date  . ABDOMINAL SURGERY    . CHOLECYSTECTOMY    . COLON RESECTION    . MOHS SURGERY     Right eyebrow  . PACEMAKER IMPLANT    . VALVE REPLACEMENT      There were no vitals filed for this visit.   Subjective Assessment - 04/19/18 1033    Subjective  Pt is an 84 y/o male who presents to OPPT for balance and hx of falls.  Pt had CVA in 2015 and reports he had to relearn how to ambulate.  Pt ambulates with RW, but tries not to use it indoors.  Pt reports falls have occured when using the RW    Pertinent History  CVA (2015), afib, HTN    Limitations  Walking    Patient Stated Goals  walk better, walk without RW    Currently in Pain?  No/denies         [facility] PT Assessment - 04/19/18 1038      Assessment   Medical Diagnosis   I69.398,R26.9 (ICD-10-CM) - Gait disturbance, post-stroke    Referring Provider (PT)  Carlis Stable, PA-C    Onset Date/Surgical Date  --   2015   Next MD Visit  07/14/17    Prior Therapy  HHPT following CVA 2-3 episodes of care      Precautions   Precautions  Fall      Restrictions   Weight Bearing Restrictions  No      Balance Screen   Has the patient fallen in the past 6 months  Yes    How many times?  3    Has the patient had a decrease in activity level because of a fear of falling?   Yes    Is the patient reluctant to leave their home because of a fear of falling?   Yes      Home Environment   Living Environment  Private residence    Living Arrangements  Children   son, daughter-in-law   Available Help at Discharge  Family    Type of Home  House    Home Access  Other (comment)  chair lift   Home Layout  Able to live on main level with bedroom/bathroom    Home Equipment  Walker - 2 wheels      Prior Function   Level of Independence  Independent with community mobility with device    Vocation  Retired    Leisure  shop at [employer] (stocks up on DVD), watch tv, movies      Cognition   Overall Cognitive Status  Within Functional Limits for tasks assessed    Behaviors  Other (comment)   needs frequent redirection     Posture/Postural Control   Posture/Postural Control  Postural limitations    Postural Limitations  Rounded Shoulders;Forward head      ROM / Strength   AROM / PROM / Strength  Strength      Strength   Overall Strength Comments  tested in sitting    Strength Assessment Site  Hip;Knee;Ankle    Right/Left Hip  Right;Left    Right Hip Flexion  3+/5    Left Hip Flexion  3/5    Right/Left Knee  Right;Left    Right Knee Flexion  5/5    Right Knee Extension  5/5   with crepitis   Left Knee Flexion  3+/5    Left Knee Extension  4/5    Right/Left Ankle  Right;Left    Right Ankle Dorsiflexion  5/5    Left Ankle Dorsiflexion  3-/5       Ambulation/Gait   Ambulation/Gait  Yes    Ambulation/Gait Assistance  5: Supervision    Ambulation Distance (Feet)  120 Feet    Assistive device  Rolling walker    Gait Pattern  Decreased stance time - left;Decreased step length - right;Decreased dorsiflexion - left;Poor foot clearance - left;Left steppage    Gait Comments  with ottobock walk on AFO      Standardized Balance Assessment   Standardized Balance Assessment  Berg Balance Test;Timed Up and Go Test      Berg Balance Test   Sit to Stand  Able to stand  independently using hands    Standing Unsupported  Able to stand safely 2 minutes    Sitting with Back Unsupported but Feet Supported on Floor or Stool  Able to sit safely and securely 2 minutes    Stand to Sit  Controls descent by using hands    Transfers  Able to transfer safely, definite need of hands    Standing Unsupported with Eyes Closed  Able to stand 10 seconds with supervision    Standing Ubsupported with Feet Together  Able to place feet together independently and stand for 1 minute with supervision    From Standing, Reach Forward with Outstretched Arm  Reaches forward but needs supervision    From Standing Position, Pick up Object from Floor  Able to pick up shoe, needs supervision    From Standing Position, Turn to Look Behind Over each Shoulder  Looks behind one side only/other side shows less weight shift    Turn 360 Degrees  Able to turn 360 degrees safely but slowly    Standing Unsupported, Alternately Place Feet on Step/Stool  Needs assistance to keep from falling or unable to try    Standing Unsupported, One Foot in Front  Able to take small step independently and hold 30 seconds    Standing on One Leg  Tries to lift leg/unable to hold 3 seconds but remains standing independently    Total Score  35  Timed Up and Go Test   Normal TUG (seconds)  24.52    TUG Comments  with RW                Objective measurements completed on examination: See  above findings.      [facility] Adult PT Treatment/Exercise - 04/19/18 1038      Self-Care   Self-Care  Other Self-Care Comments    Other Self-Care Comments   long discussion about PT expectations for progress, use of AD for safe ambulation, plan of care and goals of care             PT Education - 04/19/18 1228    Education Details  see self care    Person(s) Educated  Patient    Methods  Explanation    Comprehension  Verbalized understanding       PT Short Term Goals - 04/19/18 1241      PT SHORT TERM GOAL #1   Title  verbalize understanding of fall precautions to decrease fall risk    Status  New    Target Date  05/17/18      PT SHORT TERM GOAL #2   Title  improve BERG balance score to > 40/56 for improved balance and decreased fall risk    Status  New    Target Date  05/17/18      PT SHORT TERM GOAL #3   Title  improve timed up and go to < 20 sec for improved mobiltiy    Status  New    Target Date  05/17/18      PT SHORT TERM GOAL #4   Title  amb > 150' with LRAD modified independent for improved mobility and community access    Status  New    Target Date  05/17/18        PT Long Term Goals - 04/19/18 1243      PT LONG TERM GOAL #1   Title  independent with HEP    Status  New    Target Date  06/14/18      PT LONG TERM GOAL #2   Title  improve BERG balance score to > 45/56 for improved balance and decreased fall risk    Status  New    Target Date  06/14/18      PT LONG TERM GOAL #3   Title  improve timed up and go to < 15 sec for improved functional mobility    Status  New    Target Date  06/14/18      PT LONG TERM GOAL #4   Title  amb > 350' with LRAD modified independent on indoor/outdoor paved surfaces for improved mobility    Status  New    Target Date  06/14/18             Plan - 04/19/18 1229    Clinical Impression Statement  Pt is an 84 y/o male who presents to OPPT for decreased strength and balance following CVA in 2015.  Pt  demonstrates decreased strength and balance, as well as gait abnormalities affecting safe, functional mobility.  Pt will benefit from PT to address deficits listed.  Pt educated on post CVA recovery and PT goals of care.  At this time, feel pt will likely need to use RW for safe mobility long term, but plan to work on balance and strengthening as well as gait training to see if pt can progress from RW.  History and Personal Factors relevant to plan of care:  CVA    Clinical Presentation  Stable    Clinical Decision Making  Low    Rehab Potential  Good    PT Frequency  2x / week    PT Duration  8 weeks    PT Treatment/Interventions  ADLs/Self Care Home Management;Aquatic Therapy;Therapeutic exercise;Therapeutic activities;Functional mobility training;Stair training;Gait training;DME Instruction;Balance training;Neuromuscular re-education;Patient/family education;Orthotic Fit/Training;Vestibular    PT Next Visit Plan  establish HEP for strengthening and balance (would likely benefit from OTAGO), gait training with SPC v/s no device    Consulted and Agree with Plan of Care  Patient       Patient will benefit from skilled therapeutic intervention in order to improve the following deficits and impairments:  Abnormal gait, Postural dysfunction, Decreased strength, Difficulty walking, Decreased balance, Decreased mobility, Decreased endurance, Decreased activity tolerance  Visit Diagnosis: Hemiplegia and hemiparesis following cerebral infarction affecting left non-dominant side (HCC) - Plan: PT plan of care cert/re-cert  Unsteadiness on feet - Plan: PT plan of care cert/re-cert  History of falling - Plan: PT plan of care cert/re-cert  Muscle weakness (generalized) - Plan: PT plan of care cert/re-cert     Problem List Patient Active Problem List   Diagnosis Date Noted  . Lower urinary tract symptoms (LUTS)   . Pancreatitis, recurrent   . History of CVA (cerebrovascular accident)   .  History of colon cancer       Clarita Crane, PT, DPT 04/19/18 12:47 PM    [facility] [address] [place], [place], [address] Phone: [phone]   Fax:  [phone]  Name: Andrew Soto. MRN: [id number] Date of Birth: 12/18/1931

## 2018-04-23 ENCOUNTER — Encounter: Payer: Self-pay | Admitting: Physician Assistant

## 2018-04-23 DIAGNOSIS — I10 Essential (primary) hypertension: Secondary | ICD-10-CM | POA: Insufficient documentation

## 2018-04-23 DIAGNOSIS — M6281 Muscle weakness (generalized): Secondary | ICD-10-CM | POA: Insufficient documentation

## 2018-04-23 DIAGNOSIS — I4891 Unspecified atrial fibrillation: Secondary | ICD-10-CM | POA: Insufficient documentation

## 2018-04-23 DIAGNOSIS — I69398 Other sequelae of cerebral infarction: Secondary | ICD-10-CM | POA: Insufficient documentation

## 2018-04-23 DIAGNOSIS — R269 Unspecified abnormalities of gait and mobility: Secondary | ICD-10-CM

## 2018-04-23 DIAGNOSIS — Z950 Presence of cardiac pacemaker: Secondary | ICD-10-CM | POA: Insufficient documentation

## 2018-04-23 DIAGNOSIS — Z9181 History of falling: Secondary | ICD-10-CM | POA: Insufficient documentation

## 2018-04-25 ENCOUNTER — Encounter: Payer: Self-pay | Admitting: Cardiology

## 2018-04-25 ENCOUNTER — Encounter: Payer: Medicare Other | Admitting: Physical Therapy

## 2018-04-26 ENCOUNTER — Encounter: Payer: Self-pay | Admitting: *Deleted

## 2018-04-26 ENCOUNTER — Encounter: Payer: Self-pay | Admitting: Cardiology

## 2018-04-26 ENCOUNTER — Ambulatory Visit (INDEPENDENT_AMBULATORY_CARE_PROVIDER_SITE_OTHER): Payer: Medicare Other | Admitting: Cardiology

## 2018-04-26 VITALS — BP 151/62 | HR 69 | Ht 66.0 in | Wt 151.4 lb

## 2018-04-26 DIAGNOSIS — I480 Paroxysmal atrial fibrillation: Secondary | ICD-10-CM

## 2018-04-26 DIAGNOSIS — I10 Essential (primary) hypertension: Secondary | ICD-10-CM

## 2018-04-26 DIAGNOSIS — I359 Nonrheumatic aortic valve disorder, unspecified: Secondary | ICD-10-CM | POA: Diagnosis not present

## 2018-04-26 DIAGNOSIS — Z950 Presence of cardiac pacemaker: Secondary | ICD-10-CM

## 2018-04-26 NOTE — Progress Notes (Signed)
Referring-Andrew Doran HeaterElizabeth Cummings, PA-C Reason for referral-atrial fibrillation  HPI: 84 year old male for evaluation of atrial fibrillation and pacemaker at request of Carlis StableCharley Elizabeth Cummings, PA-C.  Patient previously resided in [place] and moved to this area in July.  Patient with history of falls.  CT October 2019 showed large subcutaneous hematoma lateral hip.  Head CT showed left scalp hematoma.  Patient has a history of what sounds to be TAVR.  He also has a history of atrial fibrillation.  His previous cardiologist did not treat with anticoagulation presumably because of recurrent falls.  Patient denies dyspnea, chest pain, palpitations.  He has had spells particularly after eating where he has decreased level of consciousness.  This can last 8 minutes at a time.  There is no associated chest pain, nausea, diaphoresis or dyspnea.  No incontinence or seizure activity.  He moved to this area and now presents to establish.  Current Outpatient Medications  Medication Sig Dispense Refill  . amiodarone (PACERONE) 200 MG tablet Take 1 tablet (200 mg total) by mouth daily. 90 tablet 1  . aspirin 81 MG tablet Take 81 mg by mouth daily.     . Cholecalciferol (VITAMIN D3 PO) Take 1 tablet by mouth daily.    . cyanocobalamin (CVS VITAMIN B12) 1000 MCG tablet Take 1,000 mcg by mouth daily.     Marland Kitchen. ezetimibe (ZETIA) 10 MG tablet Take 1 tablet (10 mg total) by mouth daily. 90 tablet 1  . tamsulosin (FLOMAX) 0.4 MG CAPS capsule Take 1 capsule (0.4 mg total) by mouth daily. 90 capsule 1  . valsartan (DIOVAN) 40 MG tablet Take 1 tablet (40 mg total) by mouth every morning. 90 tablet 0   No current facility-administered medications for this visit.     Allergies  Allergen Reactions  . Epinephrine Other (See Comments)    Dizziness/double vision  . Penicillins Rash     Past Medical History:  Diagnosis Date  . Actinic keratoses   . Aortic stenosis   . Atrial fibrillation (HCC)   . CVA  (cerebral vascular accident) (HCC)   . High cholesterol   . History of colon cancer   . History of CVA (cerebrovascular accident)   . Hypertension   . Lower urinary tract symptoms (LUTS)   . Pancreatitis, recurrent     Past Surgical History:  Procedure Laterality Date  . ABDOMINAL SURGERY    . CHOLECYSTECTOMY    . COLON RESECTION    . MOHS SURGERY     Right eyebrow  . PACEMAKER IMPLANT    . VALVE REPLACEMENT      Social History   Socioeconomic History  . Marital status: Widowed    Spouse name: Not on file  . Number of children: 6  . Years of education: Not on file  . Highest education level: Not on file  Occupational History  . Not on file  Social Needs  . Financial resource strain: Not on file  . Food insecurity:    Worry: Not on file    Inability: Not on file  . Transportation needs:    Medical: Not on file    Non-medical: Not on file  Tobacco Use  . Smoking status: Former Smoker    Packs/day: 1.00    Years: 5.00    Pack years: 5.00    Types: Cigarettes  . Smokeless tobacco: Never Used  Substance and Sexual Activity  . Alcohol use: Yes    Comment: glass wine daily  . Drug  use: Never  . Sexual activity: Not Currently    Birth control/protection: Abstinence  Lifestyle  . Physical activity:    Days per week: Not on file    Minutes per session: Not on file  . Stress: Not on file  Relationships  . Social connections:    Talks on phone: Not on file    Gets together: Not on file    Attends religious service: Not on file    Active member of club or organization: Not on file    Attends meetings of clubs or organizations: Not on file    Relationship status: Not on file  . Intimate partner violence:    Fear of current or ex partner: Not on file    Emotionally abused: Not on file    Physically abused: Not on file    Forced sexual activity: Not on file  Other Topics Concern  . Not on file  Social History Narrative  . Not on file    Family History    Problem Relation Age of Onset  . Testicular cancer Father     ROS: no fevers or chills, productive cough, hemoptysis, dysphasia, odynophagia, melena, hematochezia, dysuria, hematuria, rash, seizure activity, orthopnea, PND, pedal edema, claudication. Remaining systems are negative.  Physical Exam:   Blood pressure (!) 151/62, pulse 69, height 5\' 6"  (1.676 m), weight 151 lb 6.4 oz (68.7 kg).  General:  Well developed/well nourished in NAD Skin warm/dry Patient not depressed No peripheral clubbing Back-normal HEENT-normal/normal eyelids Neck supple/normal carotid upstroke bilaterally; no bruits; no JVD; no thyromegaly chest - CTA/ normal expansion CV - RRR/normal S1 and S2; no rubs or gallops;  PMI nondisplaced 2/6 systolic and diastolic murmur left sternal border. Abdomen -NT/ND, no HSM, no mass, + bowel sounds, no bruit 2+ femoral pulses, no bruits Ext-no edema, chords, 2+ DP Neuro-grossly nonfocal  ECG -March 27, 2018-sinus rhythm, first-degree AV block, left bundle branch block.  Personally reviewed  A/P  1 status post TAVR-we will obtain all previous records from [place].  I will arrange an echocardiogram as he sounds to have aortic insufficiency on examination.  We discussed the importance of SBE prophylaxis.  2 paroxysmal atrial fibrillation-continue amiodarone.  Recent liver functions and chest x-ray negative.  I will check TSH.  He has multiple embolic risk factors but has a history of falls as outlined in HPI.  Recently had scalp hematoma.  I think risk of anticoagulation outweighs benefit.  Continue aspirin.  Note previous cardiologist in [place] also felt risk of anticoagulation outweigh benefit.  Patient understands there is a higher risk of CVA off of anticoagulation.  3 question history of syncope-patient's daughter-in-law brought a video of episode and it appears patient has diminished level of consciousness but is not frankly unconscious.  Episodes last up to 8  minutes.  Does not sound cardiac.  4 prior pacemaker-I will refer to Dr. Elberta Fortis in [facility] to follow.  5 hypertension-blood pressure is elevated.  I have asked him to follow this and we will advance regimen as needed.  6 hyperlipidemia-continue statin.  Olga Millers, MD

## 2018-04-26 NOTE — Patient Instructions (Signed)
Medication Instructions:   NO CHANGE  Labwork:  Your physician recommends that you HAVE LAB WORK TODAY  Testing/Procedures:  Your physician has requested that you have an echocardiogram. Echocardiography is a painless test that uses sound waves to create images of your heart. It provides your doctor with information about the size and shape of your heart and how well your heart's chambers and valves are working. This procedure takes approximately one hour. There are no restrictions for this procedure.    Follow-Up:  Your physician recommends that you schedule a follow-up appointment in: 6 MONTHS WITH DR CRENSHAW PLEASE GIVE OUR OFFICE A CALL 2 MONTHS PRIOR TO THAT APPOINTMENT TIME TO SCHEDULE   REFERRAL TO DR CAMNITZ IN [place] FOR PACEMAKER

## 2018-04-27 ENCOUNTER — Telehealth: Payer: Self-pay | Admitting: *Deleted

## 2018-04-27 ENCOUNTER — Encounter: Payer: Self-pay | Admitting: Physical Therapy

## 2018-04-27 ENCOUNTER — Ambulatory Visit (INDEPENDENT_AMBULATORY_CARE_PROVIDER_SITE_OTHER): Payer: Medicare Other | Admitting: Physical Therapy

## 2018-04-27 DIAGNOSIS — R2681 Unsteadiness on feet: Secondary | ICD-10-CM

## 2018-04-27 DIAGNOSIS — M6281 Muscle weakness (generalized): Secondary | ICD-10-CM | POA: Diagnosis not present

## 2018-04-27 DIAGNOSIS — R7989 Other specified abnormal findings of blood chemistry: Secondary | ICD-10-CM

## 2018-04-27 DIAGNOSIS — Z9181 History of falling: Secondary | ICD-10-CM | POA: Diagnosis not present

## 2018-04-27 DIAGNOSIS — I69354 Hemiplegia and hemiparesis following cerebral infarction affecting left non-dominant side: Secondary | ICD-10-CM | POA: Diagnosis not present

## 2018-04-27 LAB — TSH: TSH: 5.62 mIU/L — ABNORMAL HIGH (ref 0.40–4.50)

## 2018-04-27 NOTE — Therapy (Signed)
[facility] [place] [address] [place], [place], [address] Phone: [phone]   Fax:  [phone]  Physical Therapy Treatment  Patient Details  Name: Andrew Soto. MRN: [id number] Date of Birth: [birth year] Referring Provider (PT): Carlis Stable, [place]   Encounter Date: 04/27/2018  PT End of Session - 04/27/18 1101    Visit Number  2    Number of Visits  16    Date for PT Re-Evaluation  06/14/18    Authorization Type  Medicare/BCBS    PT Start Time  1101    PT Stop Time  1145    PT Time Calculation (min)  44 min       Past Medical History:  Diagnosis Date  . Actinic keratoses   . Aortic stenosis   . Atrial fibrillation (HCC)   . CVA (cerebral vascular accident) (HCC)   . High cholesterol   . History of colon cancer   . History of CVA (cerebrovascular accident)   . Hypertension   . Lower urinary tract symptoms (LUTS)   . Pancreatitis, recurrent     Past Surgical History:  Procedure Laterality Date  . ABDOMINAL SURGERY    . CHOLECYSTECTOMY    . COLON RESECTION    . MOHS SURGERY     Right eyebrow  . PACEMAKER IMPLANT    . VALVE REPLACEMENT      There were no vitals filed for this visit.  Subjective Assessment - 04/27/18 1101    Subjective  Pt reports he is doing exercises from prior therapy group in [place], every other day.  He hasn't had any falls in 3 wks.      Currently in Pain?  No/denies         [facility] Adult PT Treatment/Exercise - 04/27/18 0001      Exercises   Exercises  Knee/Hip      Knee/Hip Exercises: Stretches   Gastroc Stretch  Right;Left;2 reps;20 seconds      Knee/Hip Exercises: Standing   Heel Raises  --    Other Standing Knee Exercises  --          Balance Exercises - 04/27/18 1123      Balance Exercises: Standing   Tandem Stance  Eyes open;Upper extremity support 1;1 rep;10 secs    SLS  Eyes open;Upper extremity support 1;2 reps;10 secs    Retro Gait   Upper extremity support;2 reps      OTAGO PROGRAM   Back Extension  Standing;5 reps   counter behind back for support   Knee Flexor  10 reps   standing hamstring curls   Ankle Plantorflexors  20 reps, support   10 reps   Ankle Dorsiflexors  20 reps, support   10 reps   Knee Bends  20 reps, support    Backwards Walking  Support    Sideways Walking  Assistive device   holding counter   Tandem Stance  10 seconds, support    Tandem Walk  Support    Heel Walking  --    Toe Walk  --    Sit to Stand  5 reps, bilateral support      Balance Exercises: Seated   Heel Raises  Both;10 reps    Toe Raise  Both;10 reps        PT Education - 04/27/18 1146    Education Details  [place] exercises: (with support) SLS x 10 sec, semi-tandem stance x 10 sec, hamstring curls, sit to stand,  lumbar ext    Person(s) Educated  Patient    Methods  Explanation;Handout;Demonstration;Verbal cues;Tactile cues    Comprehension  Verbalized understanding;Returned demonstration;Verbal cues required       PT Short Term Goals - 04/19/18 1241      PT SHORT TERM GOAL #1   Title  verbalize understanding of fall precautions to decrease fall risk    Status  New    Target Date  05/17/18      PT SHORT TERM GOAL #2   Title  improve BERG balance score to > 40/56 for improved balance and decreased fall risk    Status  New    Target Date  05/17/18      PT SHORT TERM GOAL #3   Title  improve timed up and go to < 20 sec for improved mobiltiy    Status  New    Target Date  05/17/18      PT SHORT TERM GOAL #4   Title  amb > 150' with LRAD modified independent for improved mobility and community access    Status  New    Target Date  05/17/18        PT Long Term Goals - 04/19/18 1243      PT LONG TERM GOAL #1   Title  independent with HEP    Status  New    Target Date  06/14/18      PT LONG TERM GOAL #2   Title  improve BERG balance score to > 45/56 for improved balance and decreased fall risk    Status   New    Target Date  06/14/18      PT LONG TERM GOAL #3   Title  improve timed up and go to < 15 sec for improved functional mobility    Status  New    Target Date  06/14/18      PT LONG TERM GOAL #4   Title  amb > 350' with LRAD modified independent on indoor/outdoor paved surfaces for improved mobility    Status  New    Target Date  06/14/18            Plan - 04/27/18 1312    Clinical Impression Statement  Pt tolerated all balance exercises well, with light UE support at counter (for HEP) and SBA for safety.  Pt will benefit from continued PT intervention to maximize functional mobility and safety.     Rehab Potential  Good    PT Frequency  2x / week    PT Duration  8 weeks    PT Treatment/Interventions  ADLs/Self Care Home Management;Aquatic Therapy;Therapeutic exercise;Therapeutic activities;Functional mobility training;Stair training;Gait training;DME Instruction;Balance training;Neuromuscular re-education;Patient/family education;Orthotic Fit/Training;Vestibular    PT Next Visit Plan  continue to progress HEP for strengthening and balance ( OTAGO), gait training with SPC v/s no device    Consulted and Agree with Plan of Care  Patient       Patient will benefit from skilled therapeutic intervention in order to improve the following deficits and impairments:  Abnormal gait, Postural dysfunction, Decreased strength, Difficulty walking, Decreased balance, Decreased mobility, Decreased endurance, Decreased activity tolerance  Visit Diagnosis: Unsteadiness on feet  Hemiplegia and hemiparesis following cerebral infarction affecting left non-dominant side (HCC)  History of falling  Muscle weakness (generalized)     Problem List Patient Active Problem List   Diagnosis Date Noted  . Muscle weakness of lower extremity 04/23/2018  . Gait disturbance, post-stroke 04/23/2018  . History of  fall 04/23/2018  . Hypertension goal BP (blood pressure) < 140/90 04/23/2018  .  Pacemaker 04/23/2018  . Atrial fibrillation (HCC) 04/23/2018  . Lower urinary tract symptoms (LUTS)   . Pancreatitis, recurrent   . History of CVA (cerebrovascular accident)   . History of colon cancer    Mayer CamelJennifer Carlson-Long, PTA 04/27/18 1:16 PM  [facility] [address] [place] [address] [place], [place], [address] Phone: [phone]   Fax:  [phone]  Name: Andrew IllJames M Keeny Jr. MRN: [id number] Date of Birth: Jun 27, 1931

## 2018-04-27 NOTE — Telephone Encounter (Signed)
Left message of results for pt  Will also release instructions to my chart Lab orders mailed to the pt

## 2018-04-27 NOTE — Telephone Encounter (Signed)
-----   Message from Lewayne BuntingBrian S Crenshaw, MD sent at 04/27/2018  7:22 AM EST ----- Recheck TSH and free T4 8 weeks Olga MillersBrian Crenshaw

## 2018-05-01 ENCOUNTER — Telehealth: Payer: Self-pay | Admitting: Cardiology

## 2018-05-02 ENCOUNTER — Ambulatory Visit (INDEPENDENT_AMBULATORY_CARE_PROVIDER_SITE_OTHER): Payer: Medicare Other | Admitting: Physical Therapy

## 2018-05-02 DIAGNOSIS — Z9181 History of falling: Secondary | ICD-10-CM | POA: Diagnosis not present

## 2018-05-02 DIAGNOSIS — R2681 Unsteadiness on feet: Secondary | ICD-10-CM | POA: Diagnosis not present

## 2018-05-02 DIAGNOSIS — M6281 Muscle weakness (generalized): Secondary | ICD-10-CM | POA: Diagnosis not present

## 2018-05-02 NOTE — Therapy (Signed)
[facility] [place] [address]  [address] [place], [place], [address] Phone: [phone]   Fax:  [phone]  Physical Therapy Treatment  Patient Details  Name: Andrew Soto. MRN: [id number] Date of Birth: [birth year] Referring Provider (PT): Carlis Stable, [place]   Encounter Date: 05/02/2018  PT End of Session - 05/02/18 1109    Visit Number  3    Number of Visits  16    Date for PT Re-Evaluation  06/14/18    Authorization Type  Medicare/BCBS    PT Start Time  1102    PT Stop Time  1143    PT Time Calculation (min)  41 min       Past Medical History:  Diagnosis Date  . Actinic keratoses   . Aortic stenosis   . Atrial fibrillation (HCC)   . CVA (cerebral vascular accident) (HCC)   . High cholesterol   . History of colon cancer   . History of CVA (cerebrovascular accident)   . Hypertension   . Lower urinary tract symptoms (LUTS)   . Pancreatitis, recurrent     Past Surgical History:  Procedure Laterality Date  . ABDOMINAL SURGERY    . CHOLECYSTECTOMY    . COLON RESECTION    . MOHS SURGERY     Right eyebrow  . PACEMAKER IMPLANT    . VALVE REPLACEMENT      There were no vitals filed for this visit.  Subjective Assessment - 05/02/18 1112    Subjective  Pt reports he got a skin tear on his Lt forearm when trying to get off the commode.  He hasn't had any falls since last visit.  He has been doing some of the exercises when he remembers.  He has been walking around his house without walker.     Patient Stated Goals  walk better, walk without RW    Currently in Pain?  No/denies      NOTE:  1" non-stick guaze roll applied over distal Lt wrist over (closed) skin tear at beginning of session to prevent re-injury during session.  No tape was applied to skin; guaze tucked under to secure.    [facility] PT Assessment - 05/02/18 0001      Assessment   Medical Diagnosis  I69.398,R26.9 (ICD-10-CM) - Gait disturbance,  post-stroke    Referring Provider (PT)  Carlis Stable, PA-C    Onset Date/Surgical Date  --   2015   Next MD Visit  07/14/17    Prior Therapy  HHPT following CVA 2-3 episodes of care       [facility] Adult PT Treatment/Exercise - 05/02/18 0001      Exercises   Exercises  Knee/Hip      Knee/Hip Exercises: Standing   Forward Step Up  Right;1 set;5 reps;Left;Hand Hold: 2   LLE 3 steps prior to fatigue.    Forward Step Up Limitations  seated rest breat after completing LLE    Step Down Limitations  3 steps, 4" alternating legs, BUE support, tremulous legs     SLS  Toe taps to 6" step with light to no support x 5 reps each leg.           Balance Exercises - 05/02/18 1109      Balance Exercises: Standing   Tandem Gait  2 reps;Upper extremity support;Forward    Retro Gait  Upper extremity support;2 reps      OTAGO PROGRAM   Knee Flexor  10 reps  with support   Ankle Plantorflexors  20 reps, support    Ankle Dorsiflexors  20 reps, support    Backwards Walking  Support    Sideways Walking  Assistive device   holding counter    Tandem Stance  10 seconds, support   2 reps     Balance Exercises: Seated   Other Seated Exercises  LAQ x 10 reps, 2 sec hold.           PT Short Term Goals - 04/19/18 1241      PT SHORT TERM GOAL #1   Title  verbalize understanding of fall precautions to decrease fall risk    Status  New    Target Date  05/17/18      PT SHORT TERM GOAL #2   Title  improve BERG balance score to > 40/56 for improved balance and decreased fall risk    Status  New    Target Date  05/17/18      PT SHORT TERM GOAL #3   Title  improve timed up and go to < 20 sec for improved mobiltiy    Status  New    Target Date  05/17/18      PT SHORT TERM GOAL #4   Title  amb > 150' with LRAD modified independent for improved mobility and community access    Status  New    Target Date  05/17/18        PT Long Term Goals - 04/19/18 1243      PT LONG TERM  GOAL #1   Title  independent with HEP    Status  New    Target Date  06/14/18      PT LONG TERM GOAL #2   Title  improve BERG balance score to > 45/56 for improved balance and decreased fall risk    Status  New    Target Date  06/14/18      PT LONG TERM GOAL #3   Title  improve timed up and go to < 15 sec for improved functional mobility    Status  New    Target Date  06/14/18      PT LONG TERM GOAL #4   Title  amb > 350' with LRAD modified independent on indoor/outdoor paved surfaces for improved mobility    Status  New    Target Date  06/14/18            Plan - 05/02/18 1225    Clinical Impression Statement  Pt requires light support on counter with standing exercises. Minimal challenging for seated reaching exercises (while sitting on dynadisc).  Pt was very challenged with forward step ups on 6" step; only able to complete 5 on RLE and 3 on LLE.  Fatigues quickly with stairs.  Progressing towards goals.     Rehab Potential  Good    PT Frequency  2x / week    PT Duration  8 weeks    PT Treatment/Interventions  ADLs/Self Care Home Management;Aquatic Therapy;Therapeutic exercise;Therapeutic activities;Functional mobility training;Stair training;Gait training;DME Instruction;Balance training;Neuromuscular re-education;Patient/family education;Orthotic Fit/Training;Vestibular    PT Next Visit Plan  continue to progress HEP for strengthening and balance ( OTAGO), gait training with SPC v/s no device. Complete TUG.     Consulted and Agree with Plan of Care  Patient       Patient will benefit from skilled therapeutic intervention in order to improve the following deficits and impairments:  Abnormal gait, Postural dysfunction, Decreased strength,  Difficulty walking, Decreased balance, Decreased mobility, Decreased endurance, Decreased activity tolerance  Visit Diagnosis: Unsteadiness on feet  History of falling  Muscle weakness (generalized)     Problem List Patient  Active Problem List   Diagnosis Date Noted  . Muscle weakness of lower extremity 04/23/2018  . Gait disturbance, post-stroke 04/23/2018  . History of fall 04/23/2018  . Hypertension goal BP (blood pressure) < 140/90 04/23/2018  . Pacemaker 04/23/2018  . Atrial fibrillation (HCC) 04/23/2018  . Lower urinary tract symptoms (LUTS)   . Pancreatitis, recurrent   . History of CVA (cerebrovascular accident)   . History of colon cancer    Mayer Camel, PTA 05/02/18 12:36 PM  [facility] [place] [address] [place] [address] [place], [place], [address] Phone: [phone]   Fax:  [phone]  Name: Andrew Soto. MRN: [id number] Date of Birth: 1931/12/21

## 2018-05-08 ENCOUNTER — Ambulatory Visit (INDEPENDENT_AMBULATORY_CARE_PROVIDER_SITE_OTHER): Payer: Medicare Other | Admitting: Physical Therapy

## 2018-05-08 DIAGNOSIS — M6281 Muscle weakness (generalized): Secondary | ICD-10-CM

## 2018-05-08 DIAGNOSIS — Z9181 History of falling: Secondary | ICD-10-CM | POA: Diagnosis not present

## 2018-05-08 DIAGNOSIS — R2681 Unsteadiness on feet: Secondary | ICD-10-CM

## 2018-05-08 NOTE — Telephone Encounter (Signed)
done

## 2018-05-08 NOTE — Therapy (Signed)
[facility] [place] [address] [place] [address] [place], [place], [address] Phone: [phone]   Fax:  [phone]  Physical Therapy Treatment  Patient Details  Name: Andrew Soto. MRN: [id number] Date of Birth: 1931-12-23 Referring Provider (PT): Carlis Stable, [place]   Encounter Date: 05/08/2018  PT End of Session - 05/08/18 1215    Visit Number  4    Number of Visits  16    Date for PT Re-Evaluation  06/14/18    Authorization Type  Medicare/BCBS    PT Start Time  1105    PT Stop Time  1145    PT Time Calculation (min)  40 min    Activity Tolerance  Patient tolerated treatment well    Behavior During Therapy  [facility] for tasks assessed/performed       Past Medical History:  Diagnosis Date  . Actinic keratoses   . Aortic stenosis   . Atrial fibrillation (HCC)   . CVA (cerebral vascular accident) (HCC)   . High cholesterol   . History of colon cancer   . History of CVA (cerebrovascular accident)   . Hypertension   . Lower urinary tract symptoms (LUTS)   . Pancreatitis, recurrent     Past Surgical History:  Procedure Laterality Date  . ABDOMINAL SURGERY    . CHOLECYSTECTOMY    . COLON RESECTION    . MOHS SURGERY     Right eyebrow  . PACEMAKER IMPLANT    . VALVE REPLACEMENT      There were no vitals filed for this visit.  Subjective Assessment - 05/08/18 1224    Subjective  Pt reports no falls over last week.  "I got a little lax on my exercises over Thanksgiving holiday".     Patient Stated Goals  walk better, walk without RW    Currently in Pain?  No/denies         [facility] PT Assessment - 05/08/18 0001      Assessment   Medical Diagnosis  I69.398,R26.9 (ICD-10-CM) - Gait disturbance, post-stroke    Referring Provider (PT)  Carlis Stable, PA-C    Onset Date/Surgical Date  --   2015   Next MD Visit  07/14/17    Prior Therapy  HHPT following CVA 2-3 episodes of care      Timed Up and Go  Test   Normal TUG (seconds)  23.49   21.43 without RW   TUG Comments  with RW       [facility] Adult PT Treatment/Exercise - 05/08/18 0001      Exercises   Exercises  Knee/Hip      Knee/Hip Exercises: Standing   Hip Abduction  Stengthening;Right;Left;2 sets;5 reps    Hip Extension  Stengthening;Both;2 sets;5 reps    Lateral Step Up  Right;Left;1 set;10 reps;Hand Hold: 2;Step Height: 4"    Lateral Step Up Limitations  2nd set - 2 steps up 6" stair with BUE support Rt and Lt leg.  (challenging)    Step Down Limitations  6 steps, 4" step,step-to pattern,  BUE support, retro step down.  reciprocal pattern x 6 steps (4") and step-to pattern forward         Balance Exercises - 05/08/18 1129      Balance Exercises: Standing   Tandem Stance  Eyes open;Upper extremity support 1;2 reps;10 secs    SLS  Eyes open;2 reps;Upper extremity support 1;10 secs;15 secs    Tandem Gait  Forward;Upper extremity support;2 reps  CGA for safety, unsteadiness with Lt stance         PT Short Term Goals - 04/19/18 1241      PT SHORT TERM GOAL #1   Title  verbalize understanding of fall precautions to decrease fall risk    Status  New    Target Date  05/17/18      PT SHORT TERM GOAL #2   Title  improve BERG balance score to > 40/56 for improved balance and decreased fall risk    Status  New    Target Date  05/17/18      PT SHORT TERM GOAL #3   Title  improve timed up and go to < 20 sec for improved mobiltiy    Status  New    Target Date  05/17/18      PT SHORT TERM GOAL #4   Title  amb > 150' with LRAD modified independent for improved mobility and community access    Status  New    Target Date  05/17/18        PT Long Term Goals - 04/19/18 1243      PT LONG TERM GOAL #1   Title  independent with HEP    Status  New    Target Date  06/14/18      PT LONG TERM GOAL #2   Title  improve BERG balance score to > 45/56 for improved balance and decreased fall risk    Status  New    Target  Date  06/14/18      PT LONG TERM GOAL #3   Title  improve timed up and go to < 15 sec for improved functional mobility    Status  New    Target Date  06/14/18      PT LONG TERM GOAL #4   Title  amb > 350' with LRAD modified independent on indoor/outdoor paved surfaces for improved mobility    Status  New    Target Date  06/14/18            Plan - 05/08/18 1216    Clinical Impression Statement  Pt demonstrated improved exercise tolerance for stairs; less tremulous. His TUG score improved by 1 second with RW and 3 seconds without RW. Progressing well towards goals and will benefit from continued PT intervention to maximize safety and mobility.     Rehab Potential  Good    PT Frequency  2x / week    PT Duration  8 weeks    PT Treatment/Interventions  ADLs/Self Care Home Management;Aquatic Therapy;Therapeutic exercise;Therapeutic activities;Functional mobility training;Stair training;Gait training;DME Instruction;Balance training;Neuromuscular re-education;Patient/family education;Orthotic Fit/Training;Vestibular    PT Next Visit Plan  continue to progress HEP for strengthening and balance ( OTAGO), gait training with SPC v/s no device. Core strengthening (in sitting vs supine)     PT Home Exercise Plan  pt to bring in exercise folder next visit.     Consulted and Agree with Plan of Care  Patient       Patient will benefit from skilled therapeutic intervention in order to improve the following deficits and impairments:  Abnormal gait, Postural dysfunction, Decreased strength, Difficulty walking, Decreased balance, Decreased mobility, Decreased endurance, Decreased activity tolerance  Visit Diagnosis: Unsteadiness on feet  History of falling  Muscle weakness (generalized)     Problem List Patient Active Problem List   Diagnosis Date Noted  . Muscle weakness of lower extremity 04/23/2018  . Gait disturbance, post-stroke 04/23/2018  . History  of fall 04/23/2018  .  Hypertension goal BP (blood pressure) < 140/90 04/23/2018  . Pacemaker 04/23/2018  . Atrial fibrillation (HCC) 04/23/2018  . Lower urinary tract symptoms (LUTS)   . Pancreatitis, recurrent   . History of CVA (cerebrovascular accident)   . History of colon cancer    Mayer CamelJennifer Carlson-Long, PTA 05/08/18 12:40 PM  [facility] [place] [address] [place] [address] [place], [place], [address] Phone: [phone]   Fax:  [phone]  Name: Andrew IllJames M Kugler Jr. MRN: [id number] Date of Birth: 1932-04-07

## 2018-05-10 ENCOUNTER — Ambulatory Visit (INDEPENDENT_AMBULATORY_CARE_PROVIDER_SITE_OTHER): Payer: Medicare Other | Admitting: Physical Therapy

## 2018-05-10 ENCOUNTER — Encounter: Payer: Self-pay | Admitting: Physical Therapy

## 2018-05-10 DIAGNOSIS — Z9181 History of falling: Secondary | ICD-10-CM

## 2018-05-10 DIAGNOSIS — R2681 Unsteadiness on feet: Secondary | ICD-10-CM | POA: Diagnosis not present

## 2018-05-10 DIAGNOSIS — I69354 Hemiplegia and hemiparesis following cerebral infarction affecting left non-dominant side: Secondary | ICD-10-CM | POA: Diagnosis not present

## 2018-05-10 DIAGNOSIS — M6281 Muscle weakness (generalized): Secondary | ICD-10-CM | POA: Diagnosis not present

## 2018-05-10 NOTE — Therapy (Signed)
[facility] [address] [place], [place], [address] Phone: [phone]   Fax:  [phone]  Physical Therapy Treatment  Patient Details  Name: Andrew IllJames M Klontz Jr. MRN: [id number] Date of Birth: 01-23-1932 Referring Provider (PT): Carlis Stableummings, Charley Elizabeth, [place]   Encounter Date: 05/10/2018  PT End of Session - 05/10/18 1114    Visit Number  5    Number of Visits  16    Date for PT Re-Evaluation  06/14/18    Authorization Type  Medicare/BCBS    PT Start Time  1028    PT Stop Time  1113    PT Time Calculation (min)  45 min    Activity Tolerance  Patient tolerated treatment well    Behavior During Therapy  [facility] for tasks assessed/performed       Past Medical History:  Diagnosis Date  . Actinic keratoses   . Aortic stenosis   . Atrial fibrillation (HCC)   . CVA (cerebral vascular accident) (HCC)   . High cholesterol   . History of colon cancer   . History of CVA (cerebrovascular accident)   . Hypertension   . Lower urinary tract symptoms (LUTS)   . Pancreatitis, recurrent     Past Surgical History:  Procedure Laterality Date  . ABDOMINAL SURGERY    . CHOLECYSTECTOMY    . COLON RESECTION    . MOHS SURGERY     Right eyebrow  . PACEMAKER IMPLANT    . VALVE REPLACEMENT      There were no vitals filed for this visit.  Subjective Assessment - 05/10/18 1021    Subjective  did exercises yesterday; no falls.  [facility]    Patient Stated Goals  walk better, walk without RW    Currently in Pain?  No/denies                       [facility]/Ex[facility] - 05/10/18 1035      Ambulation/Gait   Gait Comments  amb 240' with min A and head turns horizontal and vertical x 80' each; cues for posture      Self-Care   Other Self-Care Comments   [facility]      Exercises   Exercises  Lumbar      Lumbar Exercises: Seated   Sit to Stand  5 reps    Sit to Stand Limitations  min A from PT with cues for technique    Other Seated Lumbar Exercises  on balance disc: forward reach/backwards lean x 10; trunk rotation x 10 bil      Lumbar Exercises: Supine   Pelvic Tilt  10 reps;5 seconds    Pelvic Tilt Limitations  min cues for technique    Clam  10 reps   alternating; red theraband with ab set   Bent Knee Raise  10 reps   alternating; red theraband with ab set   Bridge  10 reps;5 seconds      Knee/Hip Exercises: Standing   Hip Abduction  Stengthening;Right;Left;10 reps    Hip Extension  Stengthening;Both;10 reps               PT Short Term Goals - 04/19/18 1241      PT SHORT TERM GOAL #1   Title  verbalize understanding of fall precautions to decrease fall risk    Status  New    Target Date  05/17/18      PT SHORT TERM GOAL #2   Title  improve BERG balance score to > 40/56 for improved balance and decreased fall risk    Status  New    Target Date  05/17/18      PT SHORT TERM GOAL #3   Title  improve timed up and go to < 20 sec for improved mobiltiy    Status  New    Target Date  05/17/18      PT SHORT TERM GOAL #4   Title  amb > 150' with LRAD modified independent for improved mobility and community access    Status  New    Target Date  05/17/18        PT Long Term Goals - 04/19/18 1243      PT LONG TERM GOAL #1   Title  independent with HEP    Status  New    Target Date  06/14/18      PT LONG TERM GOAL #2   Title  improve BERG balance score to > 45/56 for improved balance and decreased fall risk    Status  New    Target Date  06/14/18      PT LONG TERM GOAL #3   Title  improve timed up and go to < 15 sec for improved functional mobility    Status  New    Target Date  06/14/18      PT LONG TERM GOAL #4   Title  amb > 350' with LRAD modified independent on indoor/outdoor paved surfaces for improved mobility    Status  New    Target Date  06/14/18            Plan -  05/10/18 1114    Clinical Impression Statement  Pt tolerated session well today requesting core exercises and balance today.  Will continue to benefit from PT to maximize function.    Rehab Potential  Good    PT Frequency  2x / week    PT Duration  8 weeks    PT Treatment/Interventions  ADLs/Self Care Home Management;Aquatic Therapy;Therapeutic exercise;Therapeutic activities;Functional mobility training;Stair training;Gait training;DME Instruction;Balance training;Neuromuscular re-education;Patient/family education;Orthotic Fit/Training;Vestibular    PT Next Visit Plan  needs STGs assessed, continue balance/core/hip strengthening    PT Home Exercise Plan  pt to bring in exercise folder next visit.     Consulted and Agree with Plan of Care  Patient       Patient will benefit from skilled therapeutic intervention in order to improve the following deficits and impairments:  Abnormal gait, Postural dysfunction, Decreased strength, Difficulty walking, Decreased balance, Decreased mobility, Decreased endurance, Decreased activity tolerance  Visit Diagnosis: Unsteadiness on feet  History of falling  Muscle weakness (generalized)  Hemiplegia and hemiparesis following cerebral infarction affecting left non-dominant side [facility])     Problem List Patient Active Problem List   Diagnosis Date Noted  . Muscle weakness of lower extremity 04/23/2018  . Gait disturbance, post-stroke 04/23/2018  . History of fall 04/23/2018  . Hypertension goal BP (blood pressure) < 140/90 04/23/2018  . Pacemaker 04/23/2018  . Atrial fibrillation (HCC) 04/23/2018  . Lower urinary tract symptoms (LUTS)   . Pancreatitis, recurrent   . History of CVA (cerebrovascular accident)   . History of colon cancer       Clarita Crane, PT, DPT 05/10/18 11:16 AM     [facility] [address] [place], [place], [address]  Phone: [phone]   Fax:   [phone]  Name: Andrew Soto. MRN: [id number] Date of Birth: Jan 07, 1932

## 2018-05-12 ENCOUNTER — Ambulatory Visit (HOSPITAL_BASED_OUTPATIENT_CLINIC_OR_DEPARTMENT_OTHER)
Admission: RE | Admit: 2018-05-12 | Discharge: 2018-05-12 | Disposition: A | Discharge status: Home / Self Care | Payer: Medicare Other | Source: Ambulatory Visit | Attending: Cardiology | Admitting: Cardiology

## 2018-05-12 ENCOUNTER — Telehealth: Payer: Self-pay | Admitting: Physician Assistant

## 2018-05-12 DIAGNOSIS — I359 Nonrheumatic aortic valve disorder, unspecified: Secondary | ICD-10-CM | POA: Diagnosis not present

## 2018-05-12 NOTE — Telephone Encounter (Signed)
DMV form completed and ready for pick up.

## 2018-05-12 NOTE — Telephone Encounter (Signed)
Recommendations left on vm -EH/RMA  

## 2018-05-12 NOTE — Progress Notes (Signed)
  Echocardiogram 2D Echocardiogram has been performed.  Jeffie Widdowson T Ethelean Colla 05/12/2018, 9:14 AM

## 2018-05-15 ENCOUNTER — Ambulatory Visit (INDEPENDENT_AMBULATORY_CARE_PROVIDER_SITE_OTHER): Payer: Medicare Other | Admitting: Physical Therapy

## 2018-05-15 DIAGNOSIS — Z9181 History of falling: Secondary | ICD-10-CM

## 2018-05-15 DIAGNOSIS — M6281 Muscle weakness (generalized): Secondary | ICD-10-CM

## 2018-05-15 DIAGNOSIS — I69354 Hemiplegia and hemiparesis following cerebral infarction affecting left non-dominant side: Secondary | ICD-10-CM | POA: Diagnosis not present

## 2018-05-15 DIAGNOSIS — R2681 Unsteadiness on feet: Secondary | ICD-10-CM | POA: Diagnosis present

## 2018-05-15 NOTE — Therapy (Signed)
[place], [place], [address] Phone: [phone]   Fax:  [phone]  Physical Therapy Treatment  Patient Details  Name: Andrew Soto. MRN: [id number] Date of Birth: May 21, 1932 Referring Provider (PT): Trixie Dredge, [place]   Encounter Date: 05/15/2018  PT End of Session - 05/15/18 1104    Visit Number  6    Number of Visits  16    Date for PT Re-Evaluation  06/14/18    Authorization Type  Medicare/BCBS    PT Start Time  1100    PT Stop Time  1140    PT Time Calculation (min)  40 min       Past Medical History:  Diagnosis Date  . Actinic keratoses   . Aortic stenosis   . Atrial fibrillation ([place])   . CVA (cerebral vascular accident) ([place])   . High cholesterol   . History of colon cancer   . History of CVA (cerebrovascular accident)   . Hypertension   . Lower urinary tract symptoms (LUTS)   . Pancreatitis, recurrent     Past Surgical History:  Procedure Laterality Date  . ABDOMINAL SURGERY    . CHOLECYSTECTOMY    . COLON RESECTION    . MOHS SURGERY     Right eyebrow  . PACEMAKER IMPLANT    . VALVE REPLACEMENT      There were no vitals filed for this visit.  Subjective Assessment - 05/15/18 1104    Subjective  "Did anyone  ever tell you that exercises are a chore?"  Pt reports he had a fall yesterday; fell out of his desk chair on wheels and bumped his Lt side of head.   He also had a "passing out episode" while out to eat at [employer] (12/4).     Pertinent History  CVA (2015), afib, HTN    Limitations  Walking    Patient Stated Goals  walk better, walk without RW    Currently in Pain?  No/denies         [facility] PT Assessment - 05/15/18 0001      Assessment   Medical Diagnosis  I69.398,R26.9 (ICD-10-CM) - Gait disturbance, post-stroke    Referring Provider (PT)  Trixie Dredge, PA-C    Onset Date/Surgical Date  --   2015   Next MD  Visit  07/14/17    Prior Therapy  HHPT following CVA 2-3 episodes of care      Timed Up and Go Test   Normal TUG (seconds)  16.66    TUG Comments  no AD        [facility] Adult PT Treatment/Exercise - 05/15/18 0001      Exercises   Exercises  Lumbar      Lumbar Exercises: Stretches   Passive Hamstring Stretch  Right;Left;2 reps;30 seconds   seated     Lumbar Exercises: Seated   Sit to Stand  5 reps   light UE support      Lumbar Exercises: Supine   Ab Set  10 reps;5 seconds    Bent Knee Raise  10 reps;2 seconds   with ab set   Bridge  10 reps;5 seconds      Knee/Hip Exercises: Standing   Hip Extension  Stengthening;Both;10 reps    Forward Step Up  Right;Left;1 set;5 reps;Hand Hold: 2    SLS  Toe taps to 6" step with light to no support x 5 reps each leg.  Reviewed current HEP exercises verbally.    Balance Exercises - 05/15/18 1120      Balance Exercises: Standing   SLS  Upper extremity support 2;Eyes open;Solid surface    Partial Tandem Stance  Eyes open;Upper extremity support 1;2 reps;15 secs      OTAGO PROGRAM   Ankle Plantorflexors  20 reps, support    Ankle Dorsiflexors  20 reps, support        PT Education - 05/15/18 1143    Education Details  HEP     Person(s) Educated  Patient    Methods  Explanation;Verbal cues;Handout    Comprehension  Verbalized understanding;Returned demonstration       PT Short Term Goals - 05/15/18 1238      PT SHORT TERM GOAL #1   Title  verbalize understanding of fall precautions to decrease fall risk    Status  On-going      PT SHORT TERM GOAL #2   Title  improve BERG balance score to > 40/56 for improved balance and decreased fall risk    Status  On-going      PT SHORT TERM GOAL #3   Title  improve timed up and go to < 20 sec for improved mobiltiy    Status  Achieved      PT SHORT TERM GOAL #4   Title  amb > 150' with LRAD modified independent for improved mobility and community access    Status  Achieved         PT Long Term Goals - 04/19/18 1243      PT LONG TERM GOAL #1   Title  independent with HEP    Status  New    Target Date  06/14/18      PT LONG TERM GOAL #2   Title  improve BERG balance score to > 45/56 for improved balance and decreased fall risk    Status  New    Target Date  06/14/18      PT LONG TERM GOAL #3   Title  improve timed up and go to < 15 sec for improved functional mobility    Status  New    Target Date  06/14/18      PT LONG TERM GOAL #4   Title  amb > 350' with LRAD modified independent on indoor/outdoor paved surfaces for improved mobility    Status  New    Target Date  06/14/18            Plan - 05/15/18 1235    Clinical Impression Statement  Pt's legs fatigued quickly with forward step-ups; only able to tolerate 5 reps (instead of 10 last session).  His TUG improved to 16.66 (without AD); has met STG #3.  He tolerated all other exercises well.  Pt will benefit from continued PT intervention to maximize function and safety.     Rehab Potential  Good    PT Frequency  2x / week    PT Duration  8 weeks    PT Treatment/Interventions  ADLs/Self Care Home Management;Aquatic Therapy;Therapeutic exercise;Therapeutic activities;Functional mobility training;Stair training;Gait training;DME Instruction;Balance training;Neuromuscular re-education;Patient/family education;Orthotic Fit/Training;Vestibular    PT Next Visit Plan  needs remaining STGs assessed, continue balance/core/hip strengthening    Consulted and Agree with Plan of Care  Patient       Patient will benefit from skilled therapeutic intervention in order to improve the following deficits and impairments:  Abnormal gait, Postural dysfunction, Decreased strength, Difficulty walking, Decreased balance, Decreased mobility,  Decreased endurance, Decreased activity tolerance  Visit Diagnosis: Unsteadiness on feet  History of falling  Muscle weakness (generalized)  Hemiplegia and hemiparesis  following cerebral infarction affecting left non-dominant side [facility])     Problem List Patient Active Problem List   Diagnosis Date Noted  . Muscle weakness of lower extremity 04/23/2018  . Gait disturbance, post-stroke 04/23/2018  . History of fall 04/23/2018  . Hypertension goal BP (blood pressure) < 140/90 04/23/2018  . Pacemaker 04/23/2018  . Atrial fibrillation ([place]) 04/23/2018  . Lower urinary tract symptoms (LUTS)   . Pancreatitis, recurrent   . History of CVA (cerebrovascular accident)   . History of colon cancer    Kerin Perna, PTA 05/15/18 1:10 PM  [facility] [place], [place], [address] Phone: [phone]   Fax:  [phone]  Name: Ebert Forrester. MRN: [id number] Date of Birth: Mar 08, 1932

## 2018-05-15 NOTE — Patient Instructions (Signed)
  Abdominal Bracing With Pelvic Floor (Hook-Lying)   With neutral spine, tighten pelvic floor and abdominals. Hold 10 seconds. Repeat __10_ times. Do _several__ times a day. Can be performed in sitting, standing.    Therapeutic - Bridging    Lift buttocks, keeping back straight and arms on floor. Hold _5___ seconds. Repeat _10___ times.  Copyright  VHI. All rights reserved.    [facility] [address] [place], [place] [address]  [phone] (office) [phone] (fax)

## 2018-05-18 ENCOUNTER — Ambulatory Visit (INDEPENDENT_AMBULATORY_CARE_PROVIDER_SITE_OTHER): Payer: Medicare Other | Admitting: Physical Therapy

## 2018-05-18 ENCOUNTER — Encounter: Payer: Self-pay | Admitting: Physical Therapy

## 2018-05-18 DIAGNOSIS — R2681 Unsteadiness on feet: Secondary | ICD-10-CM

## 2018-05-18 DIAGNOSIS — Z9181 History of falling: Secondary | ICD-10-CM | POA: Diagnosis not present

## 2018-05-18 DIAGNOSIS — M6281 Muscle weakness (generalized): Secondary | ICD-10-CM | POA: Diagnosis not present

## 2018-05-18 NOTE — Patient Instructions (Addendum)
  HIP: Hamstrings - Short Sitting    Rest leg on raised surface. Keep knee straight. Lift chest. Hold _30-60__ seconds. _2__ reps per set, _1-2_ sets per day, _7_ days per week   [facility] [address] [place], [place] [address]  [phone] (office) [phone] (fax)

## 2018-05-18 NOTE — Therapy (Signed)
Andrew Soto  [place], [place], [address] Phone: [phone]   Fax:  [phone]  Physical Therapy Treatment  Patient Details  Name: Andrew Soto. MRN: [id number] Date of Birth: [birth year] Referring Provider (PT): Trixie Dredge, [place]   Encounter Date: 05/18/2018  PT End of Session - 05/18/18 1134    Visit Number  7    Number of Visits  16    Authorization Type  Medicare/BCBS    PT Start Time  1115    PT Stop Time  1210   MHP last 10 min    PT Time Calculation (min)  55 min    Behavior During Therapy  [facility] for tasks assessed/performed       Past Medical History:  Diagnosis Date  . Actinic keratoses   . Aortic stenosis   . Atrial fibrillation ([place])   . CVA (cerebral vascular accident) ([place])   . High cholesterol   . History of colon cancer   . History of CVA (cerebrovascular accident)   . Hypertension   . Lower urinary tract symptoms (LUTS)   . Pancreatitis, recurrent     Past Surgical History:  Procedure Laterality Date  . ABDOMINAL SURGERY    . CHOLECYSTECTOMY    . COLON RESECTION    . MOHS SURGERY     Right eyebrow  . PACEMAKER IMPLANT    . VALVE REPLACEMENT      There were no vitals filed for this visit.  Subjective Assessment - 05/18/18 1145    Subjective  "I find it a real chore to get out of a chair".  Pt voices desire to visit Orthopedic doctor to have his knees check out; feels they are limiting him and causing his imbalance.     Patient Stated Goals  walk better, walk without RW    Currently in Pain?  No/denies         [facility] PT Assessment - 05/18/18 0001      Assessment   Medical Diagnosis  I69.398,R26.9 (ICD-10-CM) - Gait disturbance, post-stroke    Referring Provider (PT)  Trixie Dredge, PA-C    Onset Date/Surgical Date  --   2015   Next MD Visit  07/14/17    Prior Therapy  HHPT following CVA 2-3 episodes of care      Berg Balance Test    Sit to Stand  Able to stand  independently using hands    Standing Unsupported  Able to stand safely 2 minutes    Sitting with Back Unsupported but Feet Supported on Floor or Stool  Able to sit safely and securely 2 minutes    Stand to Sit  Controls descent by using hands    Transfers  Able to transfer safely, definite need of hands    Standing Unsupported with Eyes Closed  Able to stand 10 seconds with supervision    Standing Ubsupported with Feet Together  Able to place feet together independently and stand 1 minute safely    From Standing, Reach Forward with Outstretched Arm  Can reach forward >12 cm safely (5")    From Standing Position, Pick up Object from Floor  Able to pick up shoe, needs supervision    From Standing Position, Turn to Look Behind Over each Shoulder  Looks behind one side only/other side shows less weight shift    Turn 360 Degrees  Able to turn 360 degrees safely but slowly   6.49 L; 4.88 R  Standing Unsupported, Alternately Place Feet on Step/Stool  Able to complete >2 steps/needs minimal assist    Standing Unsupported, One Foot in Front  Able to take small step independently and hold 30 seconds    Standing on One Leg  Tries to lift leg/unable to hold 3 seconds but remains standing independently    Total Score  39        [facility] Adult PT Treatment/Exercise - 05/18/18 0001      Lumbar Exercises: Stretches   Passive Hamstring Stretch  Right;Left;2 reps;60 seconds   seated   Hip Flexor Stretch  Right;Left;2 reps;60 seconds    Hip Flexor Stretch Limitations  seated with one leg back.     Quad Stretch  Right;Left;2 reps;60 seconds    Quad Stretch Limitations  foot under seat, leaning back.       Knee/Hip Exercises: Standing   Stairs  reciprocal pattern up/down 4" and 6" step with bilat UE support x 6 reps (Up 2, down 3)    SLS  Toe taps to 6" step with light to no support x 5 reps each leg.       Modalities   Modalities  Moist Heat      Moist Heat Therapy    Number Minutes Moist Heat  12 Minutes    Moist Heat Location  Lumbar Spine      verbally reviewed current HEP, including frequency and duration of exercises. Encouragement given to continue completing them to assist in maintaining/improving current mobility. Pt verbalized understanding.     Balance Exercises - 05/18/18 1229      Balance Exercises: Standing   Partial Tandem Stance  Eyes open;Upper extremity support 1;2 reps;15 secs          PT Short Term Goals - 05/18/18 1139      PT SHORT TERM GOAL #1   Title  verbalize understanding of fall precautions to decrease fall risk    Status  Achieved      PT SHORT TERM GOAL #2   Title  improve BERG balance score to > 40/56 for improved balance and decreased fall risk    Status  On-going      PT SHORT TERM GOAL #3   Title  improve timed up and go to < 20 sec for improved mobiltiy    Status  Achieved      PT SHORT TERM GOAL #4   Title  amb > 150' with LRAD modified independent for improved mobility and community access    Status  Achieved        PT Long Term Goals - 05/18/18 1135      PT LONG TERM GOAL #1   Title  independent with HEP    Status  On-going      PT LONG TERM GOAL #2   Title  improve BERG balance score to > 45/56 for improved balance and decreased fall risk    Status  On-going      PT LONG TERM GOAL #3   Title  improve timed up and go to < 15 sec for improved functional mobility    Status  On-going      PT LONG TERM GOAL #4   Title  amb > 350' with LRAD modified independent on indoor/outdoor paved surfaces for improved mobility    Status  On-going            Plan - 05/18/18 1139    Clinical Impression Statement  Pt's Berg score improved 5  points to 39 out of 56; continues to be at risk of falls.  He tolerated most exercises today, but reported some discomfort across his low back after completing the SLS for Berg test.  Pain in low back reported as 2/10; reduced with use of MHP at end of session.   Pt has partially met his goals and requests to hold therapy while he continues to work on [employer].      Rehab Potential  Good    PT Frequency  2x / week    PT Duration  8 weeks    PT Treatment/Interventions  ADLs/Self Care Home Management;Aquatic Therapy;Therapeutic exercise;Therapeutic activities;Functional mobility training;Stair training;Gait training;DME Instruction;Balance training;Neuromuscular re-education;Patient/family education;Orthotic Fit/Training;Vestibular    PT Next Visit Plan  Spoke to supervising PT; will hold therapy per pt request.      Consulted and Agree with Plan of Care  Patient       Patient will benefit from skilled therapeutic intervention in order to improve the following deficits and impairments:  Abnormal gait, Postural dysfunction, Decreased strength, Difficulty walking, Decreased balance, Decreased mobility, Decreased endurance, Decreased activity tolerance  Visit Diagnosis: Unsteadiness on feet  History of falling  Muscle weakness (generalized)     Problem List Patient Active Problem List   Diagnosis Date Noted  . Muscle weakness of lower extremity 04/23/2018  . Gait disturbance, post-stroke 04/23/2018  . History of fall 04/23/2018  . Hypertension goal BP (blood pressure) < 140/90 04/23/2018  . Pacemaker 04/23/2018  . Atrial fibrillation ([place]) 04/23/2018  . Lower urinary tract symptoms (LUTS)   . Pancreatitis, recurrent   . History of CVA (cerebrovascular accident)   . History of colon cancer    Kerin Perna, PTA 05/18/18 12:33 PM  [place], [place], [address] Phone: [phone]   Fax:  [phone]  Name: Andrew Soto. MRN: [id number] Date of Birth: Dec 30, 1931

## 2018-05-25 ENCOUNTER — Ambulatory Visit (INDEPENDENT_AMBULATORY_CARE_PROVIDER_SITE_OTHER): Payer: Medicare Other

## 2018-05-25 ENCOUNTER — Ambulatory Visit (INDEPENDENT_AMBULATORY_CARE_PROVIDER_SITE_OTHER): Payer: Medicare Other | Admitting: Family Medicine

## 2018-05-25 ENCOUNTER — Encounter: Payer: Self-pay | Admitting: Family Medicine

## 2018-05-25 VITALS — BP 156/66 | HR 66 | Wt 156.0 lb

## 2018-05-25 DIAGNOSIS — M170 Bilateral primary osteoarthritis of knee: Secondary | ICD-10-CM | POA: Diagnosis not present

## 2018-05-25 DIAGNOSIS — M25562 Pain in left knee: Secondary | ICD-10-CM | POA: Diagnosis not present

## 2018-05-25 DIAGNOSIS — M25561 Pain in right knee: Secondary | ICD-10-CM | POA: Diagnosis not present

## 2018-05-25 DIAGNOSIS — I700 Atherosclerosis of aorta: Secondary | ICD-10-CM | POA: Diagnosis not present

## 2018-05-25 DIAGNOSIS — G8929 Other chronic pain: Secondary | ICD-10-CM

## 2018-05-25 DIAGNOSIS — Z9181 History of falling: Secondary | ICD-10-CM

## 2018-05-25 DIAGNOSIS — M5136 Other intervertebral disc degeneration, lumbar region: Secondary | ICD-10-CM

## 2018-05-25 DIAGNOSIS — M545 Low back pain, unspecified: Secondary | ICD-10-CM

## 2018-05-25 MED ORDER — DICLOFENAC SODIUM 1 % TD GEL: 4.0000 g | Freq: Four times a day (QID) | TRANSDERMAL | 11 refills | Status: DC

## 2018-05-25 NOTE — Progress Notes (Signed)
Andrew Soto. is a 84 y.o. male who presents to [facility] today for bilateral knee and lower back pain.   Right knee pain: Andrew Soto says he hasn't had cartilage in his right knee for over 40 years. He notes that it hurts the most when he gets out of chairs. He has popping and clicking. He is curious about getting hyaluronic acid injections.  Left knee pain: Andrew Soto says he also has pain and no cartilage in his left knee. It does not pop and click, and it does not cause as much pain as the right knee.   He is able to get around his apartment without his walker, but anytime he walks outside, he uses the walker. He is curious about getting knee replacements in both knees.   Lower back pain: He has had severe back pain above the belt on his right side for the past two weeks. He does not recall any injuries or inciting events. It hurts the most when he is reaching for something with his right hand. He has not tried ice, heat, or NSAIDs. He has not have numbness or tingling.    ROS:  As above  Exam:  BP (!) 156/66   Pulse 66   Wt 156 lb (70.8 kg)   BMI 25.18 kg/m  General: Well Developed, well nourished, and in no acute distress.  Neuro/Psych: Alert and oriented x3, extra-ocular muscles intact, able to move all 4 extremities, sensation grossly intact. Skin: Warm and dry, no rashes noted.  Respiratory: Not using accessory muscles, speaking in full sentences, trachea midline.  Cardiovascular: Pulses palpable, no extremity edema. Abdomen: Does not appear distended. MSK:   Right knee: Normal-appearing.  No tenderness to palpation. Range of motion 5-100 degrees with retropatellar crepitations. 4/5 strength with resisted flexion and extension.  Positive McMurray test.  Left knee:  Normal-appearing. No tenderness to palpation. Range of motion 5-100 degrees with crepitations on extension 4/5 strength with resisted flexion and  extension.  Negative McMurray test.  Lower back: Nontender to spinal midline. Tender palpation right lumbar paraspinal musculature. Limited range of motion due to pain. Lower extremity sensation is intact.  Patient walks using a walker.     Lab and Radiology Results X-ray images personally independently reviewed.  X-ray right knee reveals severe patellofemoral DJD with moderate to mild medial compartment DJD. No fractures apparent.  X-ray right knee reveals severe patellofemoral DJD.  No acute fractures.  X-ray L-spine reveals severe degenerative changes with scoliotic changes.  Aortic and iliac calcifications present.  Await formal radiology review.     Assessment and Plan: 84 y.o. male with  Knee pain: Degenerative changes seen on x-rays bilaterally. Had in depth discussion with pt that surgery is likely not an option and recommended starting with conservative therapy. Use diclofenac gel on knees and take Tylenol as needed. Follow-up in 4 weeks. If not improving, will do cortisone injection at next appointment.   Lower back pain: Degenerative changes seen on x-ray. Advised pt that this will likely resolve with PT. placed.  Also concentrate on core exercises. Andrew Soto would like to also go to a chiropractor as he has had good success with this in the past.   Aortic atherosclerosis seen on x-ray.  Continue antiplatelet and hypertensive control.  Consider statins.  Follow-up with PCP and cardiology.  Orders Placed This Encounter  Procedures  . DG Knee Complete 4 Views Left    Please include patellar sunrise,  lateral, and weightbearing bilateral AP and bilateral rosenberg views    Standing Status:   Future    Number of Occurrences:   1    Standing Expiration Date:   07/25/2019    Order Specific Question:   Reason for exam:    Answer:   Please include patellar sunrise, lateral, and weightbearing bilateral AP and bilateral rosenberg views    Comments:   Please include  patellar sunrise, lateral, and weightbearing bilateral AP and bilateral rosenberg views    Order Specific Question:   Preferred imaging location?    Answer[facility]   [facility]  . DG Knee Complete 4 Views Right    Please include patellar sunrise, lateral, and weightbearing bilateral AP and bilateral rosenberg views    Standing Status:   Future    Number of Occurrences:   1    Standing Expiration Date:   07/25/2019    Order Specific Question:   Reason for exam:    Answer:   Please include patellar sunrise, lateral, and weightbearing bilateral AP and bilateral rosenberg views    Comments:   Please include patellar sunrise, lateral, and weightbearing bilateral AP and bilateral rosenberg views    Order Specific Question:   Preferred imaging location?    Answer:   [facility]  . DG Lumbar Spine Complete    Standing Status:   Future    Number of Occurrences:   1    Standing Expiration Date:   07/27/2019    Order Specific Question:   Reason for Exam (SYMPTOM  OR DIAGNOSIS REQUIRED)    Answer:   eval chronic low back pain    Order Specific Question:   Preferred imaging location?    Answer:   [facility]    Order Specific Question:   Radiology Contrast Protocol - do NOT remove file path    Answer:   [email or web address]  . Ambulatory referral to Physical Therapy    Referral Priority:   Routine    Referral Type:   Physical Medicine    Referral Reason:   Specialty Services Required    Requested Specialty:   Physical Therapy   Meds ordered this encounter  Medications  . diclofenac sodium (VOLTAREN) 1 % GEL    Sig: Apply 4 g topically 4 (four) times daily. To affected joint.    Dispense:  400 g    Refill:  11    Bilateral knee OA. Pt cannot tolerated ibuprofen or aleve    Historical information moved to improve visibility of documentation.  Past Medical History:  Diagnosis Date  . Actinic keratoses   . Aortic stenosis   .  Atrial fibrillation (HCC)   . CVA (cerebral vascular accident) (HCC)   . High cholesterol   . History of colon cancer   . History of CVA (cerebrovascular accident)   . Hypertension   . Lower urinary tract symptoms (LUTS)   . Pancreatitis, recurrent    Past Surgical History:  Procedure Laterality Date  . ABDOMINAL SURGERY    . CHOLECYSTECTOMY    . COLON RESECTION    . MOHS SURGERY     Right eyebrow  . PACEMAKER IMPLANT    . VALVE REPLACEMENT     Social History   Tobacco Use  . Smoking status: Former Smoker    Packs/day: 1.00    Years: 5.00    Pack years: 5.00    Types: Cigarettes  . Smokeless tobacco: Never Used  Substance Use Topics  . Alcohol use:  Yes    Comment: glass wine daily   family history includes Testicular cancer in his father.  Medications: Current Outpatient Medications  Medication Sig Dispense Refill  . amiodarone (PACERONE) 200 MG tablet Take 1 tablet (200 mg total) by mouth daily. 90 tablet 1  . aspirin 81 MG tablet Take 81 mg by mouth daily.     . Cholecalciferol (VITAMIN D3 PO) Take 1 tablet by mouth daily.    . cyanocobalamin ([employer] VITAMIN B12) 1000 MCG tablet Take 1,000 mcg by mouth daily.     Marland Kitchen. ezetimibe (ZETIA) 10 MG tablet Take 1 tablet (10 mg total) by mouth daily. 90 tablet 1  . tamsulosin (FLOMAX) 0.4 MG CAPS capsule Take 1 capsule (0.4 mg total) by mouth daily. 90 capsule 1  . valsartan (DIOVAN) 40 MG tablet Take 1 tablet (40 mg total) by mouth every morning. 90 tablet 0  . diclofenac sodium (VOLTAREN) 1 % GEL Apply 4 g topically 4 (four) times daily. To affected joint. 400 g 11   No current facility-administered medications for this visit.    Allergies  Allergen Reactions  . Epinephrine Other (See Comments)    Dizziness/double vision  . Penicillins Rash      Discussed warning signs or symptoms. Please see discharge instructions. Patient expresses understanding.   I personally was present and performed or re-performed the history,  physical exam and medical decision-making activities of this service and have verified that the service and findings are accurately documented in the student's note. ___________________________________________ Clementeen GrahamEvan Jamyah Folk M.D., ABFM., CAQSM. Primary Care and Sports Medicine Adjunct Instructor of Family Medicine  [employer]

## 2018-05-25 NOTE — Patient Instructions (Addendum)
Thank you for coming in today. I will restart physical therapy for your back.  I will prescribe diclofenac gel for your knees.  Use 4x daily.  If not getting better recheck soon.  Otherwise recheck in 4 weeks.  If knees not better I will do a cortisone shot.   Ok to continue core exercise.  OK to see chiropractor.

## 2018-05-26 ENCOUNTER — Encounter: Payer: Self-pay | Admitting: Physical Therapy

## 2018-05-26 DIAGNOSIS — I700 Atherosclerosis of aorta: Secondary | ICD-10-CM | POA: Insufficient documentation

## 2018-05-29 ENCOUNTER — Telehealth: Payer: Self-pay | Admitting: Physician Assistant

## 2018-05-29 ENCOUNTER — Encounter: Payer: Self-pay | Admitting: Family Medicine

## 2018-05-29 ENCOUNTER — Encounter: Payer: Self-pay | Admitting: Physical Therapy

## 2018-05-29 MED ORDER — CYCLOBENZAPRINE HCL 5 MG PO TABS: 5.0000 mg | ORAL_TABLET | Freq: Three times a day (TID) | ORAL | 0 refills | Status: DC | PRN

## 2018-05-29 NOTE — Telephone Encounter (Signed)
Pt advised.

## 2018-05-29 NOTE — Telephone Encounter (Signed)
I spoke to patient son and he stated that patient is having severe back pain. He stated that his father has been to physical therapy and he went to chiropractor. Currently they are doing ice treatment and Tylenol which is not helping but patient son is wanting a muscle relaxer sent to pharmacy. Please advise. Mikail Goostree,CMA

## 2018-05-29 NOTE — Telephone Encounter (Signed)
Muscle relaxer sent but if patient is in severe pain he may need medical evaluation.  If the muscle relaxers are not helpful or if symptoms are concerning, he should seek emergency care

## 2018-05-29 NOTE — Telephone Encounter (Signed)
Patients son calling in wanting to know what he should do about his father. He is in extreme back pain to the point where he can not lay down and is crying. No appointments available for today. Would like to know if he needs to take medication or what to do. Please contact and advise.

## 2018-05-30 ENCOUNTER — Encounter: Payer: Medicare Other | Admitting: Physical Therapy

## 2018-06-01 ENCOUNTER — Ambulatory Visit (INDEPENDENT_AMBULATORY_CARE_PROVIDER_SITE_OTHER): Payer: Medicare Other | Admitting: Family Medicine

## 2018-06-01 ENCOUNTER — Encounter: Payer: Self-pay | Admitting: Family Medicine

## 2018-06-01 VITALS — BP 193/64 | HR 67 | Temp 97.4°F | Wt 153.1 lb

## 2018-06-01 DIAGNOSIS — M7061 Trochanteric bursitis, right hip: Secondary | ICD-10-CM | POA: Diagnosis not present

## 2018-06-01 DIAGNOSIS — M7062 Trochanteric bursitis, left hip: Secondary | ICD-10-CM | POA: Diagnosis not present

## 2018-06-01 MED ORDER — TRAMADOL HCL 50 MG PO TABS: 50.0000 mg | ORAL_TABLET | Freq: Four times a day (QID) | ORAL | 0 refills | Status: DC | PRN

## 2018-06-01 NOTE — Progress Notes (Signed)
Andrew Soto. is a 84 y.o. male who presents to [facility] today for follow-up back pain.  Zyron was seen on December 19 for knee pain and back pain.  He been having severe back pain at that point for about 2 weeks.  He did not recall any injury.  His knee pain was evaluated and found to have significant patellofemoral DJD and he was started on conservative management with diclofenac gel.  We discussed his different injection options and he elected for trial of conservative management first.  Additionally he noted significant back pain.  X-ray showed multilevel degenerative changes with degenerative scoliosis and anterolisthesis.  He is here today noting that he continues to have significant pain especially into his lateral hips bilaterally.  He has had multiple treatments with chiropractor with little to any benefit.  He notes pain is worse when he stands from a seated position and when he lays on either left or right side.  He notes he does not have severe pain in his back or his knees today.  ROS:  As above  Exam:  BP (!) 193/64   Pulse 67   Temp (!) 97.4 F (36.3 C) (Oral)   Wt 153 lb 1.6 oz (69.4 kg)   BMI 24.71 kg/m  General: Well Developed, well nourished, and in no acute distress.  Neuro/Psych: Alert and oriented x3, extra-ocular muscles intact, able to move all 4 extremities, sensation grossly intact. Skin: Warm and dry, no rashes noted.  Respiratory: Not using accessory muscles, speaking in full sentences, trachea midline.  Cardiovascular: Pulses palpable, no extremity edema. Abdomen: Does not appear distended. MSK:  L-spine nontender to midline..  Nontender paraspinal musculature. Decreased lumbar motion due to pain. Right hip normal-appearing normal motion.  Tender palpation greater trochanter.  Significantly painful and reduced strength to resisted hip abduction.  Abduction strength 3+/5. Left hip normal-appearing  normal motion.  Tender palpation greater trochanter.  Strength 3+/5 pain with resisted abduction    Lab and Radiology Results  Hip greater trochanteric injection: right Consent obtained and timeout performed. Area of maximum tenderness palpated and identified. Skin cleaned with alcohol, cold spray applied. A spinal needle was used to access the greater trochanteric bursa. 40 mg of Depo-Medrol, and 2.5 mL of Marcaine were used to inject the trochanteric bursa. Patient tolerated the procedure well.    Dg Chest 2 View  Result Date: 03/30/2018 CLINICAL DATA:  Shortness of breath, chest pain. EXAM: CHEST - 2 VIEW COMPARISON:  Radiographs of March 27, 2018. FINDINGS: Stable cardiomediastinal silhouette. Left-sided pacemaker is unchanged in position. Atherosclerosis of thoracic aorta is noted. No pneumothorax or pleural effusion is noted. Both lungs are clear. The visualized skeletal structures are unremarkable. IMPRESSION: No active cardiopulmonary disease. Aortic Atherosclerosis (ICD10-I70.0). Electronically Signed   By: Lupita Raider, M.D.   On: 03/30/2018 10:42   Dg Chest 2 View  Result Date: 03/27/2018 CLINICAL DATA:  Near syncope with chest pain EXAM: CHEST - 2 VIEW COMPARISON:  None. FINDINGS: Left-sided pacing device and vascular stent. No focal opacity or pleural effusion. Heart size upper normal. Aortic atherosclerosis. No pneumothorax. IMPRESSION: No active cardiopulmonary disease. Electronically Signed   By: Jasmine Pang M.D.   On: 03/27/2018 23:29   Dg Lumbar Spine Complete  Result Date: 05/26/2018 CLINICAL DATA:  Chronic low back pain for several weeks without sciatic symptoms. EXAM: LUMBAR SPINE - COMPLETE 4+ VIEW COMPARISON:  None. FINDINGS: The bones are subjectively osteopenic. There is  mild curvature convex toward the left centered at L3. There is mild lateral subluxation to the right of L3 with respect L2 and L4. There is multilevel degenerative disc space narrowing  with relative sparing of L4-5. There is mild grade 1 anterolisthesis of L4 with respect L5. There is facet joint hypertrophy from L3 inferiorly. There is calcification in the wall of the abdominal aorta with a stent graft in the distal aorta and common iliac arteries. A biliary stent is present. IMPRESSION: Multilevel degenerative disc disease and facet joint change. Mild dextrocurvature centered at L3. Mild lateral subluxation of L3 with respect L2 and L4. Grade 1 anterolisthesis of L4 with respect to L5. Electronically Signed   By: David  Swaziland M.D.   On: 05/26/2018 07:47   Ct Head Wo Contrast  Result Date: 03/27/2018 CLINICAL DATA:  Trip and fall.  Head laceration.  Hypertensive. EXAM: CT HEAD WITHOUT CONTRAST TECHNIQUE: Contiguous axial images were obtained from the base of the skull through the vertex without intravenous contrast. COMPARISON:  None. FINDINGS: BRAIN: No intraparenchymal hemorrhage, mass effect nor midline shift. The ventricles and sulci are normal for age. Patchy supratentorial white matter hypodensities within normal range for patient's age, though non-specific are most compatible with chronic small vessel ischemic disease. LEFT greater than RIGHT parietal lobe encephalomalacia. Confluent no acute large vascular territory infarcts. No abnormal extra-axial fluid collections. Basal cisterns are patent. VASCULAR: Severe calcific atherosclerosis of the carotid siphons. SKULL: No skull fracture. Small LEFT frontoparietal scalp hematoma without subcutaneous gas or radiopaque foreign bodies. SINUSES/ORBITS: Trace paranasal sinus mucosal thickening. Mastoid air cells are well aerated.The included ocular globes and orbital contents are non-suspicious. Status post bilateral ocular lens implants. OTHER: None. IMPRESSION: 1. No acute intracranial process.  Moderate LEFT scalp hematoma. 2. 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 3. Moderate chronic small vessel ischemic changes. Electronically Signed   By:  Awilda Metro M.D.   On: 03/27/2018 21:05   Ct Hip Left Wo Contrast  Result Date: 03/27/2018 CLINICAL DATA:  Left hip pain after fall on sidewalk. EXAM: CT OF THE LEFT HIP WITHOUT CONTRAST TECHNIQUE: Multidetector CT imaging of the left hip was performed according to the standard protocol. Multiplanar CT image reconstructions were also generated. COMPARISON:  Radiographs earlier this day. FINDINGS: Bones/Joint/Cartilage Cortical margins of the left hip are intact. No evidence of acute fracture. Minor hip joint space narrowing and acetabular spurring. No hip joint effusion. Pubic rami are intact. Ligaments Suboptimally assessed by CT. Muscles and Tendons Subcutaneous contusion lateral to the gluteus musculature without definite muscular hematoma. Soft tissues Subcutaneous soft tissue hematoma about the lateral to hip located in the subcutaneous fat. Hematoma spans 3.1 x 5 x 11.1 cm with adjacent surrounding stranding. There are vascular calcifications of included pelvic vasculature. Large volume of stool in the colon. IMPRESSION: 1. Negative for left hip fracture. 2. Large subcutaneous hematoma about the lateral hip. Electronically Signed   By: Narda Rutherford M.D.   On: 03/27/2018 23:59   Dg Knee Complete 4 Views Left  Result Date: 05/26/2018 CLINICAL DATA:  Chronic bilateral knee pain for many years. The patient was unable to stand for this series. EXAM: LEFT KNEE - COMPLETE 4+ VIEW COMPARISON:  None. FINDINGS: The bones are subjectively adequately mineralized. There is no acute fracture nor dislocation. There is mild narrowing of the lateral joint compartment as well as the patellofemoral compartment. There is beaking of the tibial spines. There is faint chondrocalcinosis of the menisci. There is spurring of the superior articular  margin of the patella. There is no acute fracture or dislocation. IMPRESSION: Mild to moderate osteoarthritic change of the left knee. No acute fracture nor dislocation.  Electronically Signed   By: David  SwazilandJordan M.D.   On: 05/26/2018 07:44   Dg Knee Complete 4 Views Right  Result Date: 05/26/2018 CLINICAL DATA:  Chronic bilateral knee pain for several years. The patient was unable to stand. EXAM: RIGHT KNEE - COMPLETE 4+ VIEW COMPARISON:  None. FINDINGS: The bones are subjectively adequately mineralized. There is narrowing of the patellofemoral joint space. There is chondrocalcinosis of the menisci. There is beaking of the tibial spines. Small spurs arise from the articular margins of the patella. There is no acute fracture or dislocation. There is no joint effusion. IMPRESSION: Mild to moderate osteoarthritic change centered on the patellofemoral compartment. Chondrocalcinosis of the menisci without significant narrowing of the medial and lateral joint compartments. Electronically Signed   By: David  SwazilandJordan M.D.   On: 05/26/2018 07:45   Dg Hips Bilat W Or Wo Pelvis 3-4 Views  Result Date: 03/27/2018 CLINICAL DATA:  Left hip pain after fall on the sidewalk. EXAM: DG HIP (WITH OR WITHOUT PELVIS) 3-4V BILAT COMPARISON:  None. FINDINGS: The cortical margins of the bony pelvis and left hip are intact. Pubic rami are intact. No fracture. Pubic symphysis and sacroiliac joints are congruent. Both femoral heads are well-seated in the respective acetabula. Mild acetabular spurring likely normal for age. There are vascular calcifications and probable bi-iliac stents. Catheter in the right mid abdomen may be a biliary stent, partially included. Surgical clips adjacent to the right acetabulum. Vascular calcifications. IMPRESSION: No fracture or dislocation of the pelvis or left hip. Electronically Signed   By: Narda RutherfordMelanie  Sanford M.D.   On: 03/27/2018 21:15   I personally (independently) visualized and performed the interpretation of the images attached in this note.     Assessment and Plan: 84 y.o. male with  Bilateral hip pain due to greater trochanteric bursitis likely.   Patient does have considerable degenerative changes in his lumbar spine that could be a contributing factor here as well.  Patient had right greater trochanter injection today with moderate benefit and pain.  Plan to proceed with physical therapy and recheck as scheduled on Monday.  Would potentially proceed with left hip injection at that time.  Would like to avoid bilateral injections due to steroid dose if possible.   Additionally limited tramadol prescribed for pain control.  PDMP reviewed during this encounter.   Orders Placed This Encounter  Procedures  . Ambulatory referral to Physical Therapy    Referral Priority:   Routine    Referral Type:   Physical Medicine    Referral Reason:   Specialty Services Required    Requested Specialty:   Physical Therapy   Meds ordered this encounter  Medications  . traMADol (ULTRAM) 50 MG tablet    Sig: Take 1 tablet (50 mg total) by mouth every 6 (six) hours as needed.    Dispense:  15 tablet    Refill:  0    Historical information moved to improve visibility of documentation.  Past Medical History:  Diagnosis Date  . Actinic keratoses   . Aortic stenosis   . Atrial fibrillation (HCC)   . CVA (cerebral vascular accident) (HCC)   . High cholesterol   . History of colon cancer   . History of CVA (cerebrovascular accident)   . Hypertension   . Lower urinary tract symptoms (LUTS)   .  Pancreatitis, recurrent    Past Surgical History:  Procedure Laterality Date  . ABDOMINAL SURGERY    . CHOLECYSTECTOMY    . COLON RESECTION    . MOHS SURGERY     Right eyebrow  . PACEMAKER IMPLANT    . VALVE REPLACEMENT     Social History   Tobacco Use  . Smoking status: Former Smoker    Packs/day: 1.00    Years: 5.00    Pack years: 5.00    Types: Cigarettes  . Smokeless tobacco: Never Used  Substance Use Topics  . Alcohol use: Yes    Comment: glass wine daily   family history includes Testicular cancer in his  father.  Medications: Current Outpatient Medications  Medication Sig Dispense Refill  . amiodarone (PACERONE) 200 MG tablet Take 1 tablet (200 mg total) by mouth daily. 90 tablet 1  . aspirin 81 MG tablet Take 81 mg by mouth daily.     . Cholecalciferol (VITAMIN D3 PO) Take 1 tablet by mouth daily.    . cyanocobalamin ([employer] VITAMIN B12) 1000 MCG tablet Take 1,000 mcg by mouth daily.     . cyclobenzaprine (FLEXERIL) 5 MG tablet Take 1-2 tablets (5-10 mg total) by mouth 3 (three) times daily as needed for muscle spasms. Caution: can cause drowsiness 60 tablet 0  . diclofenac sodium (VOLTAREN) 1 % GEL Apply 4 g topically 4 (four) times daily. To affected joint. 400 g 11  . ezetimibe (ZETIA) 10 MG tablet Take 1 tablet (10 mg total) by mouth daily. 90 tablet 1  . tamsulosin (FLOMAX) 0.4 MG CAPS capsule Take 1 capsule (0.4 mg total) by mouth daily. 90 capsule 1  . valsartan (DIOVAN) 40 MG tablet Take 1 tablet (40 mg total) by mouth every morning. 90 tablet 0  . traMADol (ULTRAM) 50 MG tablet Take 1 tablet (50 mg total) by mouth every 6 (six) hours as needed. 15 tablet 0   No current facility-administered medications for this visit.    Allergies  Allergen Reactions  . Epinephrine Other (See Comments)    Dizziness/double vision  . Penicillins Rash      Discussed warning signs or symptoms. Please see discharge instructions. Patient expresses understanding.

## 2018-06-01 NOTE — Patient Instructions (Addendum)
Thank you for coming in today. Take tramadol for pain as needed.  Do physical therapy  Call or go to the ER if you develop a large red swollen joint with extreme pain or oozing puss.   Do physical therpay for hips.   Trochanteric Bursitis Trochanteric bursitis is a condition that causes hip pain. Trochanteric bursitis happens when fluid-filled sacs (bursae) in the hip get irritated. Normally these sacs absorb shock and help strong bands of tissue (tendons) in your hip glide smoothly over each other and over your hip bones. What are the causes? This condition results from increased friction between the hip bones and the tendons that go over them. This condition can happen if you:  Have weak hips.  Use your hip muscles too much (overuse).  Get hit in the hip. What increases the risk? This condition is more likely to develop in:  Women.  Adults who are middle-aged or older.  People with arthritis or a spinal condition.  People with weak buttocks muscles (gluteal muscles).  People who have one leg that is shorter than the other.  People who participate in certain kinds of athletic activities, such as: ? Running sports, especially long-distance running. ? Contact sports, like football or martial arts. ? Sports in which falls may occur, like skiing. What are the signs or symptoms? The main symptom of this condition is pain and tenderness over the point of your hip. The pain may be:  Sharp and intense.  Dull and achy.  Felt on the outside of your thigh. It may increase when you:  Lie on your side.  Walk or run.  Go up on stairs.  Sit.  Stand up after sitting.  Stand for long periods of time. How is this diagnosed? This condition may be diagnosed based on:  Your symptoms.  Your medical history.  A physical exam.  Imaging tests, such as: ? X-rays to check your bones. ? An MRI or ultrasound to check your tendons and muscles. During your physical exam, your  health care provider will check the movement and strength of your hip. He or she may press on the point of your hip to check for pain. How is this treated? This condition may be treated by:  Resting.  Reducing your activity.  Avoiding activities that cause pain.  Using crutches, a cane, or a walker to decrease the strain on your hip.  Taking medicine to help with swelling.  Having medicine injected into the bursae to help with swelling.  Using ice, heat, and massage therapy for pain relief.  Physical therapy exercises for strength and flexibility.  Surgery (rare). Follow these instructions at home: Activity  Rest.  Avoid activities that cause pain.  Return to your normal activities as told by your health care provider. Ask your health care provider what activities are safe for you. Managing pain, stiffness, and swelling  Take over-the-counter and prescription medicines only as told by your health care provider.  If directed, apply heat to the injured area as told by your health care provider. ? Place a towel between your skin and the heat source. ? Leave the heat on for 20-30 minutes. ? Remove the heat if your skin turns bright red. This is especially important if you are unable to feel pain, heat, or cold. You may have a greater risk of getting burned.  If directed, apply ice to the injured area: ? Put ice in a plastic bag. ? Place a towel between your skin and  the bag. ? Leave the ice on for 20 minutes, 2-3 times a day. General instructions  If the affected leg is one that you use for driving, ask your health care provider when it is safe to drive.  Use crutches, a cane, or a walker as told by your health care provider.  If one of your legs is shorter than the other, get fitted for a shoe insert.  Lose weight if you are overweight. How is this prevented?  Wear supportive footwear that is appropriate for your sport.  If you have hip pain, start any new exercise  or sport slowly.  Maintain physical fitness, including: ? Strength. ? Flexibility. Contact a health care provider if:  Your pain does not improve with 2-4 weeks. Get help right away if:  You develop severe pain.  You have a fever.  You develop increased redness over your hip.  You have a change in your bowel function or bladder function.  You cannot control the muscles in your feet. This information is not intended to replace advice given to you by your health care provider. Make sure you discuss any questions you have with your health care provider. Document Released: 07/01/2004 Document Revised: 01/28/2016 Document Reviewed: 05/09/2015 Elsevier Interactive Patient Education  2019 [employer]

## 2018-06-05 ENCOUNTER — Ambulatory Visit: Payer: Medicare Other | Admitting: Family Medicine

## 2018-06-05 ENCOUNTER — Encounter: Payer: Self-pay | Admitting: Family Medicine

## 2018-06-05 ENCOUNTER — Ambulatory Visit (INDEPENDENT_AMBULATORY_CARE_PROVIDER_SITE_OTHER): Payer: Medicare Other | Admitting: Family Medicine

## 2018-06-05 VITALS — BP 147/62 | HR 71 | Wt 146.0 lb

## 2018-06-05 DIAGNOSIS — M545 Low back pain, unspecified: Secondary | ICD-10-CM

## 2018-06-05 DIAGNOSIS — M7062 Trochanteric bursitis, left hip: Secondary | ICD-10-CM | POA: Diagnosis not present

## 2018-06-05 DIAGNOSIS — M25561 Pain in right knee: Secondary | ICD-10-CM | POA: Diagnosis not present

## 2018-06-05 DIAGNOSIS — G8929 Other chronic pain: Secondary | ICD-10-CM

## 2018-06-05 DIAGNOSIS — M25562 Pain in left knee: Secondary | ICD-10-CM

## 2018-06-05 DIAGNOSIS — M7061 Trochanteric bursitis, right hip: Secondary | ICD-10-CM

## 2018-06-05 NOTE — Patient Instructions (Signed)
Thank you for coming in today.  Use tylenol arthritis 2 pills every 8 hours.  Acetaminophen extended release   Continue physical therapy.   Use tramadol sparingly.   Recheck in 1 month or sooner if needed.   I can inject the knees or the hip at any time.

## 2018-06-05 NOTE — Progress Notes (Signed)
Andrew ChurnJames M Jonathon ResidesOreilly Jr. is a 84 y.o. male who presents to [facility] today for back and hip and knee pain.  Andrew Soto has been seen several times for bilateral knee pain, back pain and, hip pain laterally.  His knee pain is thought to be due to considerable patellofemoral DJD.  His back pain is also due to degenerative disc disease as well as facet DJD.  Additionally he was seen recently for lateral hip pain and thought to have some trochanteric bursitis.  He had a steroid injection at the right lateral greater trochanter on December 26 and notes significant improvement in pain.  He notes he is doing reasonably well overall and is interested in continue with physical therapy for his back and hips and knees.  He notes knee pain has improved a bit with diclofenac gel.    ROS:  As above  Exam:  BP (!) 147/62   Pulse 71   Wt 146 lb (66.2 kg)   BMI 23.57 kg/m  General: Well Developed, well nourished, and in no acute distress.  Neuro/Psych: Alert and oriented x3, extra-ocular muscles intact, able to move all 4 extremities, sensation grossly intact. Skin: Warm and dry, no rashes noted.  Respiratory: Not using accessory muscles, speaking in full sentences, trachea midline.  Cardiovascular: Pulses palpable, no extremity edema. Abdomen: Does not appear distended. MSK:  L-spine nontender to midline decreased lumbar motion due to pain. Knee bilaterally normal-appearing crepitations with extension intact strength. Hip right: Nontender normal motion. Hip left mildly tender normal motion.    Lab and Radiology Results No results found for this or any previous visit (from the past 72 hour(s)). No results found.     Assessment and Plan: 84 y.o. male with  L-spine pain, greater trochanteric pain, knee pain.  Doing reasonably well.  Plan to continue with physical therapy.  Recheck in 1 month.  If needed patient will return to clinic sooner for steroid  injection in knees or left hip.  I spent 15 minutes with this patient, greater than 50% was face-to-face time counseling regarding differential diagnosis treatment plan and options..     No orders of the defined types were placed in this encounter.  No orders of the defined types were placed in this encounter.   Historical information moved to improve visibility of documentation.  Past Medical History:  Diagnosis Date  . Actinic keratoses   . Aortic stenosis   . Atrial fibrillation (HCC)   . CVA (cerebral vascular accident) (HCC)   . High cholesterol   . History of colon cancer   . History of CVA (cerebrovascular accident)   . Hypertension   . Lower urinary tract symptoms (LUTS)   . Pancreatitis, recurrent    Past Surgical History:  Procedure Laterality Date  . ABDOMINAL SURGERY    . CHOLECYSTECTOMY    . COLON RESECTION    . MOHS SURGERY     Right eyebrow  . PACEMAKER IMPLANT    . VALVE REPLACEMENT     Social History   Tobacco Use  . Smoking status: Former Smoker    Packs/day: 1.00    Years: 5.00    Pack years: 5.00    Types: Cigarettes  . Smokeless tobacco: Never Used  Substance Use Topics  . Alcohol use: Yes    Comment: glass wine daily   family history includes Testicular cancer in his father.  Medications: Current Outpatient Medications  Medication Sig Dispense Refill  . amiodarone (PACERONE)  200 MG tablet Take 1 tablet (200 mg total) by mouth daily. 90 tablet 1  . aspirin 81 MG tablet Take 81 mg by mouth daily.     . Cholecalciferol (VITAMIN D3 PO) Take 1 tablet by mouth daily.    . cyanocobalamin ([employer] VITAMIN B12) 1000 MCG tablet Take 1,000 mcg by mouth daily.     . cyclobenzaprine (FLEXERIL) 5 MG tablet Take 1-2 tablets (5-10 mg total) by mouth 3 (three) times daily as needed for muscle spasms. Caution: can cause drowsiness 60 tablet 0  . diclofenac sodium (VOLTAREN) 1 % GEL Apply 4 g topically 4 (four) times daily. To affected joint. 400 g 11  .  ezetimibe (ZETIA) 10 MG tablet Take 1 tablet (10 mg total) by mouth daily. 90 tablet 1  . tamsulosin (FLOMAX) 0.4 MG CAPS capsule Take 1 capsule (0.4 mg total) by mouth daily. 90 capsule 1  . traMADol (ULTRAM) 50 MG tablet Take 1 tablet (50 mg total) by mouth every 6 (six) hours as needed. 15 tablet 0  . valsartan (DIOVAN) 40 MG tablet Take 1 tablet (40 mg total) by mouth every morning. 90 tablet 0   No current facility-administered medications for this visit.    Allergies  Allergen Reactions  . Epinephrine Other (See Comments)    Dizziness/double vision  . Penicillins Rash      Discussed warning signs or symptoms. Please see discharge instructions. Patient expresses understanding.

## 2018-06-06 ENCOUNTER — Telehealth: Payer: Self-pay

## 2018-06-06 NOTE — Telephone Encounter (Signed)
Would recommend using a gentle stool softener like Colace twice a day and if no bowel movement, start Miralax in addition to Colace until stooling normally

## 2018-06-06 NOTE — Telephone Encounter (Signed)
Andrew Soto complains of constipation for 2 days. He wants to know if he could take dulcolax with current medications. Please advised.

## 2018-06-08 NOTE — Telephone Encounter (Signed)
Patient advised of recommendations.  

## 2018-06-12 ENCOUNTER — Encounter: Payer: Self-pay | Admitting: Physical Therapy

## 2018-06-12 ENCOUNTER — Other Ambulatory Visit: Payer: Self-pay

## 2018-06-12 ENCOUNTER — Encounter: Payer: Medicare Other | Admitting: Rehabilitative and Restorative Service Providers"

## 2018-06-12 ENCOUNTER — Ambulatory Visit (INDEPENDENT_AMBULATORY_CARE_PROVIDER_SITE_OTHER): Payer: Medicare Other | Admitting: Physical Therapy

## 2018-06-12 DIAGNOSIS — M6281 Muscle weakness (generalized): Secondary | ICD-10-CM | POA: Diagnosis not present

## 2018-06-12 DIAGNOSIS — M545 Low back pain, unspecified: Secondary | ICD-10-CM

## 2018-06-12 DIAGNOSIS — G8929 Other chronic pain: Secondary | ICD-10-CM

## 2018-06-12 NOTE — Therapy (Signed)
[facility] [place] [address]  [address] [place], [place], [address] Phone: [phone]   Fax:  [phone]  Physical Therapy Evaluation  Patient Details  Name: Andrew Soto. MRN: [id number] Date of Birth: [birth year] Referring Provider (PT): Clementeen Graham, MD   Encounter Date: 06/12/2018  PT End of Session - 06/12/18 1358    Visit Number  1    Number of Visits  12    Date for PT Re-Evaluation  07/24/18    Authorization Type  Medicare/BCBS    PT Start Time  1140    PT Stop Time  1235    PT Time Calculation (min)  55 min    Activity Tolerance  Patient tolerated treatment well    Behavior During Therapy  [facility] for tasks assessed/performed       Past Medical History:  Diagnosis Date  . Actinic keratoses   . Aortic stenosis   . Atrial fibrillation (HCC)   . CVA (cerebral vascular accident) (HCC)   . High cholesterol   . History of colon cancer   . History of CVA (cerebrovascular accident)   . Hypertension   . Lower urinary tract symptoms (LUTS)   . Pancreatitis, recurrent     Past Surgical History:  Procedure Laterality Date  . ABDOMINAL SURGERY    . CHOLECYSTECTOMY    . COLON RESECTION    . MOHS SURGERY     Right eyebrow  . PACEMAKER IMPLANT    . VALVE REPLACEMENT      There were no vitals filed for this visit.   Subjective Assessment - 06/12/18 1136    Subjective  Pt returns to PT today with new referral for back pain and bil trochanteric bursitis.  Pt reports inconsistent compliance with exercises when he is at home.  Pt feels this is mainly due to lack of motivation, but pt wants to improve quality of life by being more diligent with exercises.  Pt was on hold for PT for balance and weakness.        Pertinent History  CVA (2015), afib, HTN    Limitations  Walking;Standing    How long can you sit comfortably?  pain with transition to/from sitting    How long can you stand comfortably?  < 1 hour    How long can you  walk comfortably?  with walker up to 10 min    Patient Stated Goals  improve quality of life, decrease pain    Currently in Pain?  Yes    Pain Score  0-No pain   up to 6/10   Pain Location  Back    Pain Orientation  Lower;Medial;Right;Left    Pain Descriptors / Indicators  Sharp    Pain Type  Chronic pain    Pain Onset  More than a month ago    Pain Frequency  Intermittent    Aggravating Factors   transitional movements, standing, forward bending    Pain Relieving Factors  tylenol arthritis         [facility] PT Assessment - 06/12/18 1153      Assessment   Medical Diagnosis  M54.5,G89.29 (ICD-10-CM) - Chronic low back pain without sciatica, M70.61,M70.62 (ICD-10-CM) - Trochanteric bursitis of both hips     Referring Provider (PT)  Clementeen Graham, MD    Onset Date/Surgical Date  --   ~ 1 year   Hand Dominance  Right    Next MD Visit  07/03/18    Prior Therapy  at  this clinic recently for balance      Precautions   Precautions  Fall      Restrictions   Weight Bearing Restrictions  No      Balance Screen   Has the patient fallen in the past 6 months  Yes    How many times?  2    Has the patient had a decrease in activity level because of a fear of falling?   Yes    Is the patient reluctant to leave their home because of a fear of falling?   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    Leisure  shop at [employer] (stocks up on DVD), watch tv, movies, reading      Cognition   Overall Cognitive Status  Within Functional Limits  for tasks assessed      Posture/Postural Control   Postural Limitations  Rounded Shoulders;Forward head;Decreased lumbar lordosis      ROM / Strength   AROM / PROM / Strength  AROM;Strength      AROM   AROM Assessment Site  Lumbar    Lumbar Flexion  limited 25% with pain    Lumbar Extension  WNL    Lumbar - Right Side Bend  WNL    Lumbar - Left Side Bend  WNL    Lumbar - Right Rotation  WNL with end range pain    Lumbar - Left Rotation  WNL with end range pain      Strength   Right Hip Flexion  3+/5    Right Hip Extension  3-/5    Right Hip ABduction  3/5    Left Hip Flexion  3+/5    Left Hip Extension  3-/5    Left Hip ABduction  3/5    Right Knee Flexion  5/5    Right Knee Extension  5/5    Left Knee Flexion  4/5    Left Knee Extension  5/5      Flexibility   Soft Tissue Assessment /Muscle Length  yes    Hamstrings  tightness bil    Piriformis  tightness bil      Special Tests    Special Tests  Lumbar    Lumbar Tests  Straight Leg Raise      Straight Leg Raise   Findings  Negative      Ambulation/Gait   Assistive device  Rolling walker    Gait Pattern  Decreased stance time - left;Decreased step length - right;Decreased dorsiflexion - left;Poor foot clearance - left;Left steppage                Objective measurements completed on examination: See above findings.      [facility] Adult PT Treatment/Exercise - 06/12/18 1153      Self-Care   Other Self-Care Comments   educated on [employer]; proper bed mobility to  decrease back pain and pressure to Lt elbow (significant bruising noted due to blood thinners)      Lumbar Exercises: Stretches   Passive Hamstring Stretch  Right;Left;1 rep;30 seconds    Passive Hamstring Stretch Limitations  supine with strap (may need to switch to sitting)    Single Knee to Chest Stretch  Right;Left;1 rep;30 seconds    Piriformis Stretch  Right;Left;1 rep;30 seconds      Lumbar Exercises: Supine   Ab  Set  5 reps;5 seconds    AB Set Limitations  cues for technique and breathing             PT Education - 06/12/18 1353    Education Details  HEP    Person(s) Educated  Patient    Methods  Explanation;Demonstration;Handout    Comprehension  Verbalized understanding;Returned demonstration;Need further instruction       PT Short Term Goals - 06/12/18 1401      PT SHORT TERM GOAL #1   Title  n/a      PT SHORT TERM GOAL #2   Title  n/a      PT SHORT TERM GOAL #3   Title  n/a      PT SHORT TERM GOAL #4   Title  n/a        PT Long Term Goals - 06/12/18 1401      PT LONG TERM GOAL #1   Title  independent with HEP    Status  New    Target Date  07/24/18      PT LONG TERM GOAL #2   Title  report pain < 3/10 with standing and walking activities for improved function and decreased pain    Status  New    Target Date  07/24/18      PT LONG TERM GOAL #3   Title  demonstrate proper bed mobility to decrease risk of reinjury and decrease pain in back    Status  New    Target Date  07/24/18      PT LONG TERM GOAL #4   Title  report 50% improvement in functional activities with pain < 3/10 for improved function and quality of life    Status  New    Target Date  07/24/18             Plan - 06/12/18 1354    Clinical Impression Statement  Pt is an 85 y/o male who returns to [facility] for low back pain and bil hip pain.  Pt demonstrates decreased ROM and strength, as well as decreased flexibility affecting functional mobility.  Pt will benefit from PT to address deficits listed.  Pt's goals are to overall improve quality of life and commit to regular exercise.  Will plan to address these goals to maximize quality of life.    History and Personal Factors relevant to plan of care:  CVA    Clinical Presentation  Evolving    Clinical Presentation due to:  increasing pain without known injury    Clinical Decision Making  Moderate    Rehab Potential  Good    PT Frequency  2x /  week    PT Duration  6 weeks    PT Treatment/Interventions  ADLs/Self Care Home Management;Aquatic Therapy;Therapeutic exercise;Therapeutic activities;Functional mobility training;Stair training;Gait training;DME Instruction;Balance training;Neuromuscular re-education;Patient/family education;Orthotic Fit/Training;Vestibular;Cryotherapy;Ultrasound;Moist Heat;Manual techniques;Dry needling;Passive range of motion;Taping    PT Next Visit Plan  review stretches, continue gentle core/hip strengthening    PT Home Exercise Plan  Access Code: [email or web address]    Consulted and Agree with Plan of Care  Patient       Patient will benefit from skilled therapeutic intervention in order to improve the following deficits and impairments:  Abnormal gait, Postural dysfunction, Decreased strength, Difficulty walking, Decreased balance, Decreased mobility, Decreased endurance, Decreased activity tolerance, Improper body mechanics, Pain, Impaired flexibility, Decreased range of motion  Visit Diagnosis: Chronic bilateral low back pain without sciatica - Plan: PT plan of care cert/re-cert  Muscle weakness (generalized) - Plan: PT plan of care cert/re-cert     Problem List Patient Active Problem List   Diagnosis Date Noted  . Aortic atherosclerosis (HCC) 05/26/2018  . Muscle weakness of lower extremity 04/23/2018  . Gait disturbance, post-stroke 04/23/2018  . History of fall 04/23/2018  . Hypertension goal BP (blood pressure) < 140/90 04/23/2018  . Pacemaker 04/23/2018  . Atrial fibrillation (HCC) 04/23/2018  . Lower urinary tract symptoms (LUTS)   . Pancreatitis, recurrent   . History of CVA (cerebrovascular accident)   . History of colon cancer       Clarita Crane, PT, DPT 06/12/18 3:22 PM    [facility] [address] [place] [address] [place], [place], [address] Phone: [phone]   Fax:  [phone]  Name: Andrew Soto. MRN: [id number] Date  of Birth: [birth year]

## 2018-06-12 NOTE — Patient Instructions (Signed)
Access Code: [email or web address]  URL: [email or web address]  Date: 06/12/2018  Prepared by: Moshe Cipro   Exercises  Supine Hamstring Stretch with Strap - 3 reps - 1 sets - 30 sec hold - 2x daily - 7x weekly  Supine Piriformis Stretch with Foot on Ground - 3 reps - 1 sets - 30 sec hold - 2x daily - 7x weekly  Hooklying Single Knee to Chest - 3 reps - 1 sets - 30 sec hold - 2x daily - 7x weekly  Supine Posterior Pelvic Tilt - 10 reps - 1 sets - 5 sec hold - 2x daily - 7x weekly

## 2018-06-14 ENCOUNTER — Ambulatory Visit (INDEPENDENT_AMBULATORY_CARE_PROVIDER_SITE_OTHER): Payer: Medicare Other | Admitting: Physical Therapy

## 2018-06-14 DIAGNOSIS — M545 Low back pain: Secondary | ICD-10-CM

## 2018-06-14 DIAGNOSIS — G8929 Other chronic pain: Secondary | ICD-10-CM | POA: Diagnosis not present

## 2018-06-14 DIAGNOSIS — M6281 Muscle weakness (generalized): Secondary | ICD-10-CM | POA: Diagnosis not present

## 2018-06-14 NOTE — Therapy (Signed)
[facility] [place] [address] [place], [place], [address] Phone: [phone]   Fax:  [phone]  Physical Therapy Treatment  Patient Details  Name: Andrew Soto. MRN: [id number] Date of Birth: 01/15/1932 Referring Provider (PT): Clementeen Graham, MD   Encounter Date: 06/14/2018  PT End of Session - 06/14/18 1213    Visit Number  2    Number of Visits  12    Date for PT Re-Evaluation  07/24/18    Authorization Type  Medicare/BCBS    PT Start Time  1100    PT Stop Time  1154   MHP Last 10 min    PT Time Calculation (min)  54 min    Activity Tolerance  Patient tolerated treatment well;No increased pain    Behavior During Therapy  WFL for tasks assessed/performed       Past Medical History:  Diagnosis Date  . Actinic keratoses   . Aortic stenosis   . Atrial fibrillation (HCC)   . CVA (cerebral vascular accident) (HCC)   . High cholesterol   . History of colon cancer   . History of CVA (cerebrovascular accident)   . Hypertension   . Lower urinary tract symptoms (LUTS)   . Pancreatitis, recurrent     Past Surgical History:  Procedure Laterality Date  . ABDOMINAL SURGERY    . CHOLECYSTECTOMY    . COLON RESECTION    . MOHS SURGERY     Right eyebrow  . PACEMAKER IMPLANT    . VALVE REPLACEMENT      There were no vitals filed for this visit.  Subjective Assessment - 06/14/18 1106    Subjective  Pt reports he will be going to [employer] today.  He started taking Arthritis Tylenol last week and has changed his pain level, "to where I'm not as grumpy".      Patient Stated Goals  improve quality of life, decrease pain    Currently in Pain?  No/denies    Pain Score  6    up to 6/10    Pain Location  Back    Pain Orientation  Lower    Pain Descriptors / Indicators  --   intermittent   Aggravating Factors   initial sitting, walking     Pain Relieving Factors  OTC meds.          [facility] PT Assessment -  06/14/18 0001      Assessment   Medical Diagnosis  M54.5,G89.29 (ICD-10-CM) - Chronic low back pain without sciatica, M70.61,M70.62 (ICD-10-CM) - Trochanteric bursitis of both hips     Referring Provider (PT)  Clementeen Graham, MD    Onset Date/Surgical Date  --   ~ 1 year   Hand Dominance  Right    Next MD Visit  07/03/18    Prior Therapy  at this clinic recently for balance       [facility] Adult PT Treatment/Exercise - 06/14/18 0001      Lumbar Exercises: Stretches   Passive Hamstring Stretch  Right;Left;2 reps;30 seconds   supine with strap   Single Knee to Chest Stretch  Right;Left;2 reps;30 seconds    Lower Trunk Rotation  60 seconds   rocking back and forth, to tolerance   Hip Flexor Stretch  --    Hip Flexor Stretch Limitations  --    Piriformis Stretch  Right;Left;2 reps;30 seconds   supine, knee towards opp shoulder     Lumbar Exercises: Aerobic   Nustep  L4: arms/legs x 6 min       Lumbar Exercises: Supine   Pelvic Tilt  10 reps;5 seconds    Dead Bug  10 reps   each side   Dead Bug Limitations  difficult alternating sides; req tactile/ cues for form    Bridge  10 reps      Modalities   Modalities  Moist Heat      Moist Heat Therapy   Number Minutes Moist Heat  10 Minutes    Moist Heat Location  Lumbar Spine             PT Education - 06/14/18 1220    Education Details  HEP - minor edits to current one on [employer].     Person(s) Educated  Patient    Methods  Explanation;Handout;Verbal cues;Demonstration    Comprehension  Verbalized understanding;Returned demonstration       PT Short Term Goals - 06/12/18 1401      PT SHORT TERM GOAL #1   Title  n/a      PT SHORT TERM GOAL #2   Title  n/a      PT SHORT TERM GOAL #3   Title  n/a      PT SHORT TERM GOAL #4   Title  n/a        PT Long Term Goals - 06/12/18 1401      PT LONG TERM GOAL #1   Title  independent with HEP    Status  New    Target Date  07/24/18      PT LONG TERM GOAL #2   Title   report pain < 3/10 with standing and walking activities for improved function and decreased pain    Status  New    Target Date  07/24/18      PT LONG TERM GOAL #3   Title  demonstrate proper bed mobility to decrease risk of reinjury and decrease pain in back    Status  New    Target Date  07/24/18      PT LONG TERM GOAL #4   Title  report 50% improvement in functional activities with pain < 3/10 for improved function and quality of life    Status  New    Target Date  07/24/18            Plan - 06/14/18 1128    Clinical Impression Statement  Pt reported some discomfort in ant Rt hip with single knee to chest and piriformis stretch; resolved with rest. He required only minor cues for counting and to breathe during exercise. Rt hamstring much tighter than Lt; tolerated supine stretch well.   Progressing towards goals.     Rehab Potential  Good    PT Frequency  2x / week    PT Duration  6 weeks    PT Treatment/Interventions  ADLs/Self Care Home Management;Aquatic Therapy;Therapeutic exercise;Therapeutic activities;Functional mobility training;Stair training;Gait training;DME Instruction;Balance training;Neuromuscular re-education;Patient/family education;Orthotic Fit/Training;Vestibular;Cryotherapy;Ultrasound;Moist Heat;Manual techniques;Dry needling;Passive range of motion;Taping    PT Next Visit Plan  review stretches, continue gentle core/hip strengthening    PT Home Exercise Plan  Access Code: [email or web address]    Consulted and Agree with Plan of Care  Patient       Patient will benefit from skilled therapeutic intervention in order to improve the following deficits and impairments:  Abnormal gait, Postural dysfunction, Decreased strength, Difficulty walking, Decreased balance, Decreased mobility, Decreased endurance, Decreased activity tolerance, Improper body mechanics, Pain, Impaired flexibility, Decreased range of  motion  Visit Diagnosis: Chronic bilateral low back pain without  sciatica  Muscle weakness (generalized)     Problem List Patient Active Problem List   Diagnosis Date Noted  . Aortic atherosclerosis (HCC) 05/26/2018  . Muscle weakness of lower extremity 04/23/2018  . Gait disturbance, post-stroke 04/23/2018  . History of fall 04/23/2018  . Hypertension goal BP (blood pressure) < 140/90 04/23/2018  . Pacemaker 04/23/2018  . Atrial fibrillation (HCC) 04/23/2018  . Lower urinary tract symptoms (LUTS)   . Pancreatitis, recurrent   . History of CVA (cerebrovascular accident)   . History of colon cancer    Mayer Camel, PTA 06/14/18 12:28 PM  [facility] [place] [address] [place], [place], [address] Phone: [phone]   Fax:  [phone]  Name: Andrew Soto. MRN: [id number] Date of Birth: [birth year]

## 2018-06-19 ENCOUNTER — Ambulatory Visit (INDEPENDENT_AMBULATORY_CARE_PROVIDER_SITE_OTHER): Payer: Medicare Other | Admitting: Physical Therapy

## 2018-06-19 ENCOUNTER — Encounter: Payer: Self-pay | Admitting: Physical Therapy

## 2018-06-19 DIAGNOSIS — G8929 Other chronic pain: Secondary | ICD-10-CM | POA: Diagnosis not present

## 2018-06-19 DIAGNOSIS — M545 Low back pain: Secondary | ICD-10-CM | POA: Diagnosis present

## 2018-06-19 DIAGNOSIS — M6281 Muscle weakness (generalized): Secondary | ICD-10-CM

## 2018-06-19 NOTE — Therapy (Signed)
[facility] [place] [address] [place], [place], [address] Phone: [phone]   Fax:  [phone]  Physical Therapy Treatment  Patient Details  Name: Andrew Soto. MRN: [id number] Date of Birth: 11/28/1931 Referring Provider (PT): Clementeen Graham, MD   Encounter Date: 06/19/2018  PT End of Session - 06/19/18 1143    Visit Number  3    Number of Visits  12    Date for PT Re-Evaluation  07/24/18    Authorization Type  Medicare/BCBS    PT Start Time  1100    PT Stop Time  1142    PT Time Calculation (min)  42 min    Activity Tolerance  Patient tolerated treatment well;No increased pain    Behavior During Therapy  WFL for tasks assessed/performed       Past Medical History:  Diagnosis Date  . Actinic keratoses   . Aortic stenosis   . Atrial fibrillation (HCC)   . CVA (cerebral vascular accident) (HCC)   . High cholesterol   . History of colon cancer   . History of CVA (cerebrovascular accident)   . Hypertension   . Lower urinary tract symptoms (LUTS)   . Pancreatitis, recurrent     Past Surgical History:  Procedure Laterality Date  . ABDOMINAL SURGERY    . CHOLECYSTECTOMY    . COLON RESECTION    . MOHS SURGERY     Right eyebrow  . PACEMAKER IMPLANT    . VALVE REPLACEMENT      There were no vitals filed for this visit.  Subjective Assessment - 06/19/18 1104    Subjective  having a lot of pain today. "I'm not sure I'll be able to do much."  sees Dr. Denyse Amass tomorrow    Patient Stated Goals  improve quality of life, decrease pain    Currently in Pain?  Yes    Pain Score  1    Rt knee 2/10   Pain Location  Back    Pain Orientation  Lower    Pain Descriptors / Indicators  Dull;Aching    Pain Type  Chronic pain    Pain Onset  More than a month ago    Pain Frequency  Intermittent    Aggravating Factors   initial sitting, walking    Pain Relieving Factors  OTC meds                       [facility]  Adult PT Treatment/Exercise - 06/19/18 1105      Lumbar Exercises: Stretches   Passive Hamstring Stretch  Right;Left;2 reps;30 seconds   supine with strap   Single Knee to Chest Stretch  Right;Left;2 reps;30 seconds    Piriformis Stretch  Right;Left;2 reps;30 seconds   supine, knee towards opp shoulder     Lumbar Exercises: Aerobic   Nustep  L2: arms/legs x 6 min    decr resistance due to elevated pain today     Lumbar Exercises: Supine   Pelvic Tilt  10 reps;5 seconds    Bridge  10 reps;5 seconds      Knee/Hip Exercises: Supine   Hip Adduction Isometric  Both;10 reps    Hip Adduction Isometric Limitations  ball squeeze x 5 sec    Other Supine Knee/Hip Exercises  isometric hip abduction in hooklying 10x5sec      Moist Heat Therapy   Number Minutes Moist Heat  20 Minutes   during supine exercises   Moist  Heat Location  Lumbar Spine               PT Short Term Goals - 06/12/18 1401      PT SHORT TERM GOAL #1   Title  n/a      PT SHORT TERM GOAL #2   Title  n/a      PT SHORT TERM GOAL #3   Title  n/a      PT SHORT TERM GOAL #4   Title  n/a        PT Long Term Goals - 06/12/18 1401      PT LONG TERM GOAL #1   Title  independent with HEP    Status  New    Target Date  07/24/18      PT LONG TERM GOAL #2   Title  report pain < 3/10 with standing and walking activities for improved function and decreased pain    Status  New    Target Date  07/24/18      PT LONG TERM GOAL #3   Title  demonstrate proper bed mobility to decrease risk of reinjury and decrease pain in back    Status  New    Target Date  07/24/18      PT LONG TERM GOAL #4   Title  report 50% improvement in functional activities with pain < 3/10 for improved function and quality of life    Status  New    Target Date  07/24/18            Plan - 06/19/18 1143    Clinical Impression Statement  Pt continues to report elevated pain limiting activity during sessions, but only rated  1-2/10 overall.  Will continue to benefit from PT to maximize function and improve pain.    Rehab Potential  Good    PT Frequency  2x / week    PT Duration  6 weeks    PT Treatment/Interventions  ADLs/Self Care Home Management;Aquatic Therapy;Therapeutic exercise;Therapeutic activities;Functional mobility training;Stair training;Gait training;DME Instruction;Balance training;Neuromuscular re-education;Patient/family education;Orthotic Fit/Training;Vestibular;Cryotherapy;Ultrasound;Moist Heat;Manual techniques;Dry needling;Passive range of motion;Taping    PT Next Visit Plan  review stretches, continue gentle core/hip strengthening    PT Home Exercise Plan  Access Code: [email or web address]    Consulted and Agree with Plan of Care  Patient       Patient will benefit from skilled therapeutic intervention in order to improve the following deficits and impairments:  Abnormal gait, Postural dysfunction, Decreased strength, Difficulty walking, Decreased balance, Decreased mobility, Decreased endurance, Decreased activity tolerance, Improper body mechanics, Pain, Impaired flexibility, Decreased range of motion  Visit Diagnosis: Chronic bilateral low back pain without sciatica  Muscle weakness (generalized)     Problem List Patient Active Problem List   Diagnosis Date Noted  . Aortic atherosclerosis (HCC) 05/26/2018  . Muscle weakness of lower extremity 04/23/2018  . Gait disturbance, post-stroke 04/23/2018  . History of fall 04/23/2018  . Hypertension goal BP (blood pressure) < 140/90 04/23/2018  . Pacemaker 04/23/2018  . Atrial fibrillation (HCC) 04/23/2018  . Lower urinary tract symptoms (LUTS)   . Pancreatitis, recurrent   . History of CVA (cerebrovascular accident)   . History of colon cancer       Clarita Crane, PT, DPT 06/19/18 11:44 AM     [facility] [address] [place], [place], [address] Phone: [phone]   Fax:   [phone]  Name: Andrew Soto. MRN: [id number] Date of Birth:  05/19/1932   

## 2018-06-20 ENCOUNTER — Ambulatory Visit (INDEPENDENT_AMBULATORY_CARE_PROVIDER_SITE_OTHER): Payer: Medicare Other | Admitting: Family Medicine

## 2018-06-20 ENCOUNTER — Encounter: Payer: Self-pay | Admitting: Family Medicine

## 2018-06-20 VITALS — BP 161/53 | HR 69 | Wt 149.0 lb

## 2018-06-20 DIAGNOSIS — G8929 Other chronic pain: Secondary | ICD-10-CM | POA: Diagnosis not present

## 2018-06-20 DIAGNOSIS — M545 Low back pain: Secondary | ICD-10-CM | POA: Diagnosis not present

## 2018-06-20 NOTE — Patient Instructions (Signed)
Thank you for coming in today. Continue current tylenol dose.  Recheck with as scheduled on the 27th.  Return sooner if needed.   I think we can try some back injection if needed.    Continue PT and home exercises.

## 2018-06-20 NOTE — Progress Notes (Signed)
Andrew Soto. is a 85 y.o. male who presents to [facility] today for low back pain.  Andrew Soto continues to experience pain in his bilateral lower back.  He does have some bilateral knee pain but notes this is only mild.  His lateral hip pain has improved.  He points to his SI joints and lower lumbar spine or sacrum is the region of maximum pain.  Pain is worse when he stands from a seated position.  He has been attending physical therapy and has had improvement.  He notes overall less pain but still continues to have pain that is bothersome.  He is distressed by the thought of having pain like this for the rest of his life.  He uses Tylenol arthritis 2 pills 3 times daily for management of pain which works marginally well.    ROS:  As above  Exam:  BP (!) 161/53   Pulse 69   Wt 149 lb (67.6 kg)   BMI 24.05 kg/m  Wt Readings from Last 5 Encounters:  06/20/18 149 lb (67.6 kg)  06/05/18 146 lb (66.2 kg)  06/01/18 153 lb 1.6 oz (69.4 kg)  05/25/18 156 lb (70.8 kg)  04/26/18 151 lb 6.4 oz (68.7 kg)   General: Well Developed, well nourished, and in no acute distress.  Neuro/Psych: Alert and oriented x3, extra-ocular muscles intact, able to move all 4 extremities, sensation grossly intact. Skin: Warm and dry, no rashes noted.  Respiratory: Not using accessory muscles, speaking in full sentences, trachea midline.  Cardiovascular: Pulses palpable, no extremity edema. Abdomen: Does not appear distended. MSK:  L-spine nontender to midline.  Tender palpation bilateral SI joints. Lumbar motion limited extension flexion and extension. Lower extremity strength reflexes and sensation are equal normal throughout.    Lab and Radiology Results Dg Chest 2 View  Result Date: 03/30/2018 CLINICAL DATA:  Shortness of breath, chest pain. EXAM: CHEST - 2 VIEW COMPARISON:  Radiographs of March 27, 2018. FINDINGS: Stable cardiomediastinal silhouette.  Left-sided pacemaker is unchanged in position. Atherosclerosis of thoracic aorta is noted. No pneumothorax or pleural effusion is noted. Both lungs are clear. The visualized skeletal structures are unremarkable. IMPRESSION: No active cardiopulmonary disease. Aortic Atherosclerosis (ICD10-I70.0). Electronically Signed   By: Lupita Raider, M.D.   On: 03/30/2018 10:42   Dg Chest 2 View  Result Date: 03/27/2018 CLINICAL DATA:  Near syncope with chest pain EXAM: CHEST - 2 VIEW COMPARISON:  None. FINDINGS: Left-sided pacing device and vascular stent. No focal opacity or pleural effusion. Heart size upper normal. Aortic atherosclerosis. No pneumothorax. IMPRESSION: No active cardiopulmonary disease. Electronically Signed   By: Jasmine Pang M.D.   On: 03/27/2018 23:29   Dg Lumbar Spine Complete  Result Date: 05/26/2018 CLINICAL DATA:  Chronic low back pain for several weeks without sciatic symptoms. EXAM: LUMBAR SPINE - COMPLETE 4+ VIEW COMPARISON:  None. FINDINGS: The bones are subjectively osteopenic. There is mild curvature convex toward the left centered at L3. There is mild lateral subluxation to the right of L3 with respect L2 and L4. There is multilevel degenerative disc space narrowing with relative sparing of L4-5. There is mild grade 1 anterolisthesis of L4 with respect L5. There is facet joint hypertrophy from L3 inferiorly. There is calcification in the wall of the abdominal aorta with a stent graft in the distal aorta and common iliac arteries. A biliary stent is present. IMPRESSION: Multilevel degenerative disc disease and facet joint change. Mild dextrocurvature  centered at L3. Mild lateral subluxation of L3 with respect L2 and L4. Grade 1 anterolisthesis of L4 with respect to L5. Electronically Signed   By: David  SwazilandJordan M.D.   On: 05/26/2018 07:47   Ct Head Wo Contrast  Result Date: 03/27/2018 CLINICAL DATA:  Trip and fall.  Head laceration.  Hypertensive. EXAM: CT HEAD WITHOUT CONTRAST  TECHNIQUE: Contiguous axial images were obtained from the base of the skull through the vertex without intravenous contrast. COMPARISON:  None. FINDINGS: BRAIN: No intraparenchymal hemorrhage, mass effect nor midline shift. The ventricles and sulci are normal for age. Patchy supratentorial white matter hypodensities within normal range for patient's age, though non-specific are most compatible with chronic small vessel ischemic disease. LEFT greater than RIGHT parietal lobe encephalomalacia. Confluent no acute large vascular territory infarcts. No abnormal extra-axial fluid collections. Basal cisterns are patent. VASCULAR: Severe calcific atherosclerosis of the carotid siphons. SKULL: No skull fracture. Small LEFT frontoparietal scalp hematoma without subcutaneous gas or radiopaque foreign bodies. SINUSES/ORBITS: Trace paranasal sinus mucosal thickening. Mastoid air cells are well aerated.The included ocular globes and orbital contents are non-suspicious. Status post bilateral ocular lens implants. OTHER: None. IMPRESSION: 1. No acute intracranial process.  Moderate LEFT scalp hematoma. 2. 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 3. Moderate chronic small vessel ischemic changes. Electronically Signed   By: Awilda Metroourtnay  Bloomer M.D.   On: 03/27/2018 21:05   Ct Hip Left Wo Contrast  Result Date: 03/27/2018 CLINICAL DATA:  Left hip pain after fall on sidewalk. EXAM: CT OF THE LEFT HIP WITHOUT CONTRAST TECHNIQUE: Multidetector CT imaging of the left hip was performed according to the standard protocol. Multiplanar CT image reconstructions were also generated. COMPARISON:  Radiographs earlier this day. FINDINGS: Bones/Joint/Cartilage Cortical margins of the left hip are intact. No evidence of acute fracture. Minor hip joint space narrowing and acetabular spurring. No hip joint effusion. Pubic rami are intact. Ligaments Suboptimally assessed by CT. Muscles and Tendons Subcutaneous contusion lateral to the gluteus musculature  without definite muscular hematoma. Soft tissues Subcutaneous soft tissue hematoma about the lateral to hip located in the subcutaneous fat. Hematoma spans 3.1 x 5 x 11.1 cm with adjacent surrounding stranding. There are vascular calcifications of included pelvic vasculature. Large volume of stool in the colon. IMPRESSION: 1. Negative for left hip fracture. 2. Large subcutaneous hematoma about the lateral hip. Electronically Signed   By: Narda RutherfordMelanie  Sanford M.D.   On: 03/27/2018 23:59   Dg Knee Complete 4 Views Left  Result Date: 05/26/2018 CLINICAL DATA:  Chronic bilateral knee pain for many years. The patient was unable to stand for this series. EXAM: LEFT KNEE - COMPLETE 4+ VIEW COMPARISON:  None. FINDINGS: The bones are subjectively adequately mineralized. There is no acute fracture nor dislocation. There is mild narrowing of the lateral joint compartment as well as the patellofemoral compartment. There is beaking of the tibial spines. There is faint chondrocalcinosis of the menisci. There is spurring of the superior articular margin of the patella. There is no acute fracture or dislocation. IMPRESSION: Mild to moderate osteoarthritic change of the left knee. No acute fracture nor dislocation. Electronically Signed   By: David  SwazilandJordan M.D.   On: 05/26/2018 07:44   Dg Knee Complete 4 Views Right  Result Date: 05/26/2018 CLINICAL DATA:  Chronic bilateral knee pain for several years. The patient was unable to stand. EXAM: RIGHT KNEE - COMPLETE 4+ VIEW COMPARISON:  None. FINDINGS: The bones are subjectively adequately mineralized. There is narrowing of the patellofemoral joint space.  There is chondrocalcinosis of the menisci. There is beaking of the tibial spines. Small spurs arise from the articular margins of the patella. There is no acute fracture or dislocation. There is no joint effusion. IMPRESSION: Mild to moderate osteoarthritic change centered on the patellofemoral compartment. Chondrocalcinosis of  the menisci without significant narrowing of the medial and lateral joint compartments. Electronically Signed   By: David  Swaziland M.D.   On: 05/26/2018 07:45   Dg Hips Bilat W Or Wo Pelvis 3-4 Views  Result Date: 03/27/2018 CLINICAL DATA:  Left hip pain after fall on the sidewalk. EXAM: DG HIP (WITH OR WITHOUT PELVIS) 3-4V BILAT COMPARISON:  None. FINDINGS: The cortical margins of the bony pelvis and left hip are intact. Pubic rami are intact. No fracture. Pubic symphysis and sacroiliac joints are congruent. Both femoral heads are well-seated in the respective acetabula. Mild acetabular spurring likely normal for age. There are vascular calcifications and probable bi-iliac stents. Catheter in the right mid abdomen may be a biliary stent, partially included. Surgical clips adjacent to the right acetabulum. Vascular calcifications. IMPRESSION: No fracture or dislocation of the pelvis or left hip. Electronically Signed   By: Narda Rutherford M.D.   On: 03/27/2018 21:15   I personally (independently) visualized and performed the interpretation of the images attached in this note.     Assessment and Plan: 85 y.o. male with  Continued low back pain.  Patient has diffuse degenerative disease throughout his lumbar spine.  The pain generators could certainly be facet joints there or his SI joints.  We discussed options.  Plan to continue physical therapy as he does have significant improvement with this.  Additionally continue home exercise program and Tylenol.  Plan to recheck as scheduled in a few weeks.  If not continuing to improve consider SI joint injections in clinic.  Patient may benefit additionally in the future from cognitive behavioral therapy as a way to help manage the suffering from his chronic pain.  His largest issue seems to be coming to terms with that he is likely going to have pain for the rest of his life.    Historical information moved to improve visibility of documentation.  Past  Medical History:  Diagnosis Date  . Actinic keratoses   . Aortic stenosis   . Atrial fibrillation (HCC)   . CVA (cerebral vascular accident) (HCC)   . High cholesterol   . History of colon cancer   . History of CVA (cerebrovascular accident)   . Hypertension   . Lower urinary tract symptoms (LUTS)   . Pancreatitis, recurrent    Past Surgical History:  Procedure Laterality Date  . ABDOMINAL SURGERY    . CHOLECYSTECTOMY    . COLON RESECTION    . MOHS SURGERY     Right eyebrow  . PACEMAKER IMPLANT    . VALVE REPLACEMENT     Social History   Tobacco Use  . Smoking status: Former Smoker    Packs/day: 1.00    Years: 5.00    Pack years: 5.00    Types: Cigarettes  . Smokeless tobacco: Never Used  Substance Use Topics  . Alcohol use: Yes    Comment: glass wine daily   family history includes Testicular cancer in his father.  Medications: Current Outpatient Medications  Medication Sig Dispense Refill  . amiodarone (PACERONE) 200 MG tablet Take 1 tablet (200 mg total) by mouth daily. 90 tablet 1  . aspirin 81 MG tablet Take 81 mg by mouth daily.     Marland Kitchen  Cholecalciferol (VITAMIN D3 PO) Take 1 tablet by mouth daily.    . cyanocobalamin ([employer] VITAMIN B12) 1000 MCG tablet Take 1,000 mcg by mouth daily.     . cyclobenzaprine (FLEXERIL) 5 MG tablet Take 1-2 tablets (5-10 mg total) by mouth 3 (three) times daily as needed for muscle spasms. Caution: can cause drowsiness 60 tablet 0  . diclofenac sodium (VOLTAREN) 1 % GEL Apply 4 g topically 4 (four) times daily. To affected joint. 400 g 11  . ezetimibe (ZETIA) 10 MG tablet Take 1 tablet (10 mg total) by mouth daily. 90 tablet 1  . tamsulosin (FLOMAX) 0.4 MG CAPS capsule Take 1 capsule (0.4 mg total) by mouth daily. 90 capsule 1  . traMADol (ULTRAM) 50 MG tablet Take 1 tablet (50 mg total) by mouth every 6 (six) hours as needed. 15 tablet 0  . valsartan (DIOVAN) 40 MG tablet Take 1 tablet (40 mg total) by mouth every morning. 90 tablet 0     No current facility-administered medications for this visit.    Allergies  Allergen Reactions  . Epinephrine Other (See Comments)    Dizziness/double vision  . Penicillins Rash      Discussed warning signs or symptoms. Please see discharge instructions. Patient expresses understanding.

## 2018-06-21 ENCOUNTER — Encounter: Payer: Self-pay | Admitting: Physical Therapy

## 2018-06-21 ENCOUNTER — Ambulatory Visit (INDEPENDENT_AMBULATORY_CARE_PROVIDER_SITE_OTHER): Payer: Medicare Other | Admitting: Physical Therapy

## 2018-06-21 DIAGNOSIS — G8929 Other chronic pain: Secondary | ICD-10-CM

## 2018-06-21 DIAGNOSIS — M6281 Muscle weakness (generalized): Secondary | ICD-10-CM | POA: Diagnosis not present

## 2018-06-21 DIAGNOSIS — M545 Low back pain, unspecified: Secondary | ICD-10-CM

## 2018-06-21 NOTE — Therapy (Addendum)
[facility] [place], [place], [address] Phone: [phone]   Fax:  [phone]  Physical Therapy Treatment/Discharge  Patient Details  Name: Andrew Soto. MRN: [id number] Date of Birth: [birth year] Referring Provider (PT): Lynne Leader, MD   Encounter Date: 06/21/2018  PT End of Session - 06/21/18 1219    Visit Number  4    Number of Visits  12    Date for PT Re-Evaluation  07/24/18    Authorization Type  Medicare/BCBS    PT Start Time  1115    PT Stop Time  1779    PT Time Calculation (min)  49 min    Activity Tolerance  Patient tolerated treatment well;No increased pain    Behavior During Therapy  WFL for tasks assessed/performed       Past Medical History:  Diagnosis Date  . Actinic keratoses   . Aortic stenosis   . Atrial fibrillation ([place])   . CVA (cerebral vascular accident) ([place])   . High cholesterol   . History of colon cancer   . History of CVA (cerebrovascular accident)   . Hypertension   . Lower urinary tract symptoms (LUTS)   . Pancreatitis, recurrent     Past Surgical History:  Procedure Laterality Date  . ABDOMINAL SURGERY    . CHOLECYSTECTOMY    . COLON RESECTION    . MOHS SURGERY     Right eyebrow  . PACEMAKER IMPLANT    . VALVE REPLACEMENT      There were no vitals filed for this visit.  Subjective Assessment - 06/21/18 1118    Subjective  hasn't noticed a lot of improvements in pain.  saw Dr. Georgina Snell yesterday and requested injection, but deferred for now.  Taking tylenol arthritis to help with pain, which provides min relief.  Going to [employer] on [address].    Patient Stated Goals  improve quality of life, decrease pain    Currently in Pain?  Yes    Pain Score  1     Pain Location  Back    Pain Orientation  Lower    Pain Descriptors / Indicators  Dull;Aching    Pain Type  Chronic pain    Pain Onset  More than a month ago    Pain Frequency  Intermittent     Aggravating Factors   initial sitting, walking    Pain Relieving Factors  OTC medication                       [facility] Adult PT Treatment/Exercise - 06/21/18 1121      Lumbar Exercises: Stretches   Single Knee to Chest Stretch  Right;Left;2 reps;30 seconds    Lower Trunk Rotation  2 reps;30 seconds   bil   Piriformis Stretch  Right;Left;2 reps;30 seconds   supine, knee towards opp shoulder   Other Lumbar Stretch Exercise  seated mid back stretch 5x10 sec hold with green physioball      Lumbar Exercises: Aerobic   Nustep  L3 x 8 min; PT present to discuss progress      Lumbar Exercises: Supine   Pelvic Tilt  10 reps;5 seconds    Clam  10 reps    Clam Limitations  with ab set    Bent Knee Raise  10 reps;2 seconds    Bent Knee Raise Limitations  with ab set      Knee/Hip Exercises: Supine   Hip Adduction  Isometric  Both;10 reps    Hip Adduction Isometric Limitations  ball squeeze x 5 sec    Other Supine Knee/Hip Exercises  isometric hip abduction in hooklying 10x5sec      Moist Heat Therapy   Number Minutes Moist Heat  20 Minutes   with supine exercises   Moist Heat Location  Lumbar Spine               PT Short Term Goals - 06/12/18 1401      PT SHORT TERM GOAL #1   Title  n/a      PT SHORT TERM GOAL #2   Title  n/a      PT SHORT TERM GOAL #3   Title  n/a      PT SHORT TERM GOAL #4   Title  n/a        PT Long Term Goals - 06/12/18 1401      PT LONG TERM GOAL #1   Title  independent with HEP    Status  New    [place] Date  07/24/18      PT LONG TERM GOAL #2   Title  report pain < 3/10 with standing and walking activities for improved function and decreased pain    Status  New    Target Date  07/24/18      PT LONG TERM GOAL #3   Title  demonstrate proper bed mobility to decrease risk of reinjury and decrease pain in back    Status  New    Target Date  07/24/18      PT LONG TERM GOAL #4   Title  report 50% improvement in  functional activities with pain < 3/10 for improved function and quality of life    Status  New    Target Date  07/24/18            Plan - 06/21/18 1219    Clinical Impression Statement  Pt tolerated session well today, incorporating more core work into exercise program today.  Pt will continue to benefit from PT to maximize function.  Minimal change to pain at this time, but overall pain levels reported to be low during sessions.    Rehab Potential  Good    PT Frequency  2x / week    PT Duration  6 weeks    PT Treatment/Interventions  ADLs/Self Care Home Management;Aquatic Therapy;Therapeutic exercise;Therapeutic activities;Functional mobility training;Stair training;Gait training;DME Instruction;Balance training;Neuromuscular re-education;Patient/family education;Orthotic Fit/Training;Vestibular;Cryotherapy;Ultrasound;Moist Heat;Manual techniques;Dry needling;Passive range of motion;Taping    PT Next Visit Plan  review stretches, continue gentle core/hip strengthening    PT Home Exercise Plan  Access Code: [email or web address]    Consulted and Agree with Plan of Care  Patient       Patient will benefit from skilled therapeutic intervention in order to improve the following deficits and impairments:  Abnormal gait, Postural dysfunction, Decreased strength, Difficulty walking, Decreased balance, Decreased mobility, Decreased endurance, Decreased activity tolerance, Improper body mechanics, Pain, Impaired flexibility, Decreased range of motion  Visit Diagnosis: Chronic bilateral low back pain without sciatica  Muscle weakness (generalized)     Problem List Patient Active Problem List   Diagnosis Date Noted  . Aortic atherosclerosis ([place]) 05/26/2018  . Muscle weakness of lower extremity 04/23/2018  . Gait disturbance, post-stroke 04/23/2018  . History of fall 04/23/2018  . Hypertension goal BP (blood pressure) < 140/90 04/23/2018  . Pacemaker 04/23/2018  . Atrial fibrillation ([place])  04/23/2018  . Lower urinary tract symptoms (LUTS)   .  Pancreatitis, recurrent   . History of CVA (cerebrovascular accident)   . History of colon cancer       Laureen Abrahams, PT, DPT 06/21/18 12:29 PM     [facility] [place], [place], [address] Phone: [phone]   Fax:  [phone]  Name: Andrew Soto. MRN: [id number] Date of Birth: 1932/05/05      PHYSICAL THERAPY DISCHARGE SUMMARY  Visits from Start of Care: 4  Current functional level related to goals / functional outcomes: See above   Remaining deficits: See above   Education / Equipment: HEP  Plan: Patient agrees to discharge.  Patient goals were not met. Patient is being discharged due to not returning since the last visit.  ?????     Laureen Abrahams, PT, DPT 08/08/18 1:14 PM  [facility] [place] [place], [place] [address]  [phone] (office) [phone] (fax)

## 2018-06-23 ENCOUNTER — Ambulatory Visit (INDEPENDENT_AMBULATORY_CARE_PROVIDER_SITE_OTHER): Payer: Medicare Other | Admitting: Sports Medicine

## 2018-06-23 ENCOUNTER — Encounter: Payer: Self-pay | Admitting: Sports Medicine

## 2018-06-23 ENCOUNTER — Ambulatory Visit (INDEPENDENT_AMBULATORY_CARE_PROVIDER_SITE_OTHER): Payer: Medicare Other

## 2018-06-23 DIAGNOSIS — M4807 Spinal stenosis, lumbosacral region: Secondary | ICD-10-CM | POA: Diagnosis not present

## 2018-06-23 DIAGNOSIS — Z950 Presence of cardiac pacemaker: Secondary | ICD-10-CM | POA: Diagnosis not present

## 2018-06-23 DIAGNOSIS — M48061 Spinal stenosis, lumbar region without neurogenic claudication: Secondary | ICD-10-CM

## 2018-06-23 MED ORDER — TRAMADOL HCL 50 MG PO TABS: 50.0000 mg | ORAL_TABLET | Freq: Three times a day (TID) | ORAL | 0 refills | Status: DC | PRN

## 2018-06-23 MED ORDER — PREDNISONE 50 MG PO TABS: ORAL_TABLET | ORAL | 0 refills | Status: DC

## 2018-06-23 NOTE — Assessment & Plan Note (Addendum)
Symptoms of lumbar spinal stenosis. He is having progressive right leg weakness. Also acute on chronic pain after a physical therapy session. Adding 5 days of prednisone, tramadol for breakthrough pain, continue Tylenol around-the-clock. 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 CT personally reviewed, there is severe lumbar stenosis from L2-L5 worst at the L4-L5 level.  His spinal canal is down to 6 mm at the L4-L5 level, he also has severe multilevel facet arthritis worse at the lower levels from L2-S1. I think if he does not get fantastic relief from the prednisone over the next few days we should go ahead and proceed with a L4-L5 interlaminar epidural.  If insufficient relief from this then we can target multiple facets.

## 2018-06-23 NOTE — Progress Notes (Signed)
Subjective:    CC: Severe low back pain  HPI: This is a pleasant 85 year old male, he has known axial degenerative disc disease, he does have constant pain, was overall improving to some degree until her recent session of physical therapy.  He then had severe pain, localized in the back radiating to the buttocks, weakness in the right leg.  No bowel or bladder dysfunction, saddle numbness, constitutional symptoms.  He has failed greater than 6 weeks of physician directed conservative measures.  I reviewed the past medical history, family history, social history, surgical history, and allergies today and no changes were needed.  Please see the problem list section below in epic for further details.  Past Medical History: Past Medical History:  Diagnosis Date  . Actinic keratoses   . Aortic stenosis   . Atrial fibrillation (HCC)   . CVA (cerebral vascular accident) (HCC)   . High cholesterol   . History of colon cancer   . History of CVA (cerebrovascular accident)   . Hypertension   . Lower urinary tract symptoms (LUTS)   . Pancreatitis, recurrent    Past Surgical History: Past Surgical History:  Procedure Laterality Date  . ABDOMINAL SURGERY    . CHOLECYSTECTOMY    . COLON RESECTION    . MOHS SURGERY     Right eyebrow  . PACEMAKER IMPLANT    . VALVE REPLACEMENT     Social History: Social History   Socioeconomic History  . Marital status: Widowed    Spouse name: Not on file  . Number of children: 6  . Years of education: Not on file  . Highest education level: Not on file  Occupational History  . Not on file  Social Needs  . Financial resource strain: Not on file  . Food insecurity:    Worry: Not on file    Inability: Not on file  . Transportation needs:    Medical: Not on file    Non-medical: Not on file  Tobacco Use  . Smoking status: Former Smoker    Packs/day: 1.00    Years: 5.00    Pack years: 5.00    Types: Cigarettes  . Smokeless tobacco: Never Used    Substance and Sexual Activity  . Alcohol use: Yes    Comment: glass wine daily  . Drug use: Never  . Sexual activity: Not Currently    Birth control/protection: Abstinence  Lifestyle  . Physical activity:    Days per week: Not on file    Minutes per session: Not on file  . Stress: Not on file  Relationships  . Social connections:    Talks on phone: Not on file    Gets together: Not on file    Attends religious service: Not on file    Active member of club or organization: Not on file    Attends meetings of clubs or organizations: Not on file    Relationship status: Not on file  Other Topics Concern  . Not on file  Social History Narrative  . Not on file   Family History: Family History  Problem Relation Age of Onset  . Testicular cancer Father    Allergies: Allergies  Allergen Reactions  . Epinephrine Other (See Comments)    Dizziness/double vision  . Penicillins Rash   Medications: See med rec.  Review of Systems: No fevers, chills, night sweats, weight loss, chest pain, or shortness of breath.   Objective:    General: Well Developed, well nourished, and in  no acute distress.  Neuro: Alert and oriented x3, extra-ocular muscles intact, sensation grossly intact.  HEENT: Normocephalic, atraumatic, pupils equal round reactive to light, neck supple, no masses, no lymphadenopathy, thyroid nonpalpable.  Skin: Warm and dry, no rashes. Cardiac: Regular rate and rhythm, no murmurs rubs or gallops, no lower extremity edema.  Respiratory: Clear to auscultation bilaterally. Not using accessory muscles, speaking in full sentences. Low back: No discrete areas of tenderness to palpation, no tenderness over the greater trochanters.  CT personally reviewed, there is severe lumbar stenosis from L2-L5 worst at the L4-L5 level.  His spinal canal is down to 6 mm at the L4-L5 level, he also has severe multilevel facet arthritis worse at the lower levels from L2-S1.  Impression and  Recommendations:    Lumbar spinal stenosis Symptoms of lumbar spinal stenosis. He is having progressive right leg weakness. Also acute on chronic pain after a physical therapy session. Adding 5 days of prednisone, tramadol for breakthrough pain, continue Tylenol around-the-clock. 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 CT personally reviewed, there is severe lumbar stenosis from L2-L5 worst at the L4-L5 level.  His spinal canal is down to 6 mm at the L4-L5 level, he also has severe multilevel facet arthritis worse at the lower levels from L2-S1. I think if he does not get fantastic relief from the prednisone over the next few days we should go ahead and proceed with a L4-L5 interlaminar epidural.  If insufficient relief from this then we can target multiple facets.  I spent 40 minutes with this patient, greater than 50% was face-to-face time counseling regarding the above diagnoses, specifically the diagnosis and treatment options for lumbar spinal stenosis as well as epidural planning. ___________________________________________ Ihor Austin. Benjamin Stain, M.D., ABFM., CAQSM. Primary Care and Sports Medicine [facility]  Adjunct Professor of Family Medicine  [employer]

## 2018-06-27 ENCOUNTER — Encounter: Payer: Medicare Other | Admitting: Physical Therapy

## 2018-06-29 ENCOUNTER — Encounter: Payer: Medicare Other | Admitting: Physical Therapy

## 2018-06-30 ENCOUNTER — Ambulatory Visit (INDEPENDENT_AMBULATORY_CARE_PROVIDER_SITE_OTHER): Payer: Medicare Other | Admitting: Family Medicine

## 2018-06-30 ENCOUNTER — Encounter: Payer: Self-pay | Admitting: Family Medicine

## 2018-06-30 VITALS — BP 149/111 | HR 56 | Ht 67.0 in | Wt 152.0 lb

## 2018-06-30 DIAGNOSIS — M48061 Spinal stenosis, lumbar region without neurogenic claudication: Secondary | ICD-10-CM | POA: Diagnosis not present

## 2018-06-30 DIAGNOSIS — R269 Unspecified abnormalities of gait and mobility: Secondary | ICD-10-CM

## 2018-06-30 DIAGNOSIS — R55 Syncope and collapse: Secondary | ICD-10-CM

## 2018-06-30 DIAGNOSIS — R739 Hyperglycemia, unspecified: Secondary | ICD-10-CM

## 2018-06-30 DIAGNOSIS — K5903 Drug induced constipation: Secondary | ICD-10-CM

## 2018-06-30 DIAGNOSIS — Z950 Presence of cardiac pacemaker: Secondary | ICD-10-CM | POA: Diagnosis not present

## 2018-06-30 DIAGNOSIS — M5416 Radiculopathy, lumbar region: Secondary | ICD-10-CM

## 2018-06-30 LAB — GLUCOSE, POCT (MANUAL RESULT ENTRY): POC Glucose: 107 mg/dl — AB (ref 70–99)

## 2018-06-30 MED ORDER — AMBULATORY NON FORMULARY MEDICATION: 0 refills | Status: DC

## 2018-06-30 MED ORDER — DULOXETINE HCL 20 MG PO CPEP: 20.0000 mg | ORAL_CAPSULE | Freq: Every day | ORAL | 0 refills | Status: DC

## 2018-06-30 MED ORDER — HYDROCODONE-ACETAMINOPHEN 5-325 MG PO TABS: 1.0000 | ORAL_TABLET | Freq: Four times a day (QID) | ORAL | 0 refills | Status: DC | PRN

## 2018-06-30 MED ORDER — BISACODYL 5 MG PO TBEC: 5.0000 mg | DELAYED_RELEASE_TABLET | Freq: Every day | ORAL | 0 refills | Status: DC | PRN

## 2018-06-30 NOTE — Addendum Note (Signed)
Addended by: Rodolph Bong on: 06/30/2018 03:45 PM   Modules accepted: Level of Service

## 2018-06-30 NOTE — Patient Instructions (Signed)
Thank you for coming in today. Come back or go to the emergency room if you notice new weakness new numbness problems walking or bowel or bladder problems. STOP tramadol Start Hydrocodone for pain.  Start Cymbalta for pain and depression.  Use over the counter Ducolax for constipation.  Keep appointment with me Monday and we can reassess Friday.   You should hear about radiology scheduling either Today or more likely Monday.     Duloxetine delayed-release capsules What is this medicine? DULOXETINE (doo LOX e teen) is used to treat depression, anxiety, and different types of chronic pain. This medicine may be used for other purposes; ask your health care provider or pharmacist if you have questions. COMMON BRAND NAME(S): Cymbalta, [place] What should I tell my health care provider before I take this medicine? They need to know if you have any of these conditions: -bipolar disorder or a family history of bipolar disorder -glaucoma -kidney disease -liver disease -suicidal thoughts or a previous suicide attempt -taken medicines called MAOIs like Carbex, Eldepryl, Marplan, Nardil, and Parnate within 14 days -an unusual reaction to duloxetine, other medicines, foods, dyes, or preservatives -pregnant or trying to get pregnant -breast-feeding How should I use this medicine? Take this medicine by mouth with a glass of water. Follow the directions on the prescription label. Do not cut, crush or chew this medicine. You can take this medicine with or without food. Take your medicine at regular intervals. Do not take your medicine more often than directed. Do not stop taking this medicine suddenly except upon the advice of your doctor. Stopping this medicine too quickly may cause serious side effects or your condition may worsen. A special MedGuide will be given to you by the pharmacist with each prescription and refill. Be sure to read this information carefully each time. Talk to your pediatrician  regarding the use of this medicine in children. While this drug may be prescribed for children as young as 217 years of age for selected conditions, precautions do apply. Overdosage: If you think you have taken too much of this medicine contact a poison control center or emergency room at once. NOTE: This medicine is only for you. Do not share this medicine with others. What if I miss a dose? If you miss a dose, take it as soon as you can. If it is almost time for your next dose, take only that dose. Do not take double or extra doses. What may interact with this medicine? Do not take this medicine with any of the following medications: -desvenlafaxine -levomilnacipran -linezolid -MAOIs like Carbex, Eldepryl, Marplan, Nardil, and Parnate -methylene blue (injected into a vein) -milnacipran -thioridazine -venlafaxine This medicine may also interact with the following medications: -alcohol -amphetamines -aspirin and aspirin-like medicines -certain antibiotics like ciprofloxacin and enoxacin -certain medicines for blood pressure, heart disease, irregular heart beat -certain medicines for depression, anxiety, or psychotic disturbances -certain medicines for migraine headache like almotriptan, eletriptan, frovatriptan, naratriptan, rizatriptan, sumatriptan, zolmitriptan -certain medicines that treat or prevent blood clots like warfarin, enoxaparin, and dalteparin -cimetidine -fentanyl -lithium -NSAIDS, medicines for pain and inflammation, like ibuprofen or naproxen -phentermine -procarbazine -rasagiline -sibutramine -St. John's wort -theophylline -tramadol -tryptophan This list may not describe all possible interactions. Give your health care provider a list of all the medicines, herbs, non-prescription drugs, or dietary supplements you use. Also tell them if you smoke, drink alcohol, or use illegal drugs. Some items may interact with your medicine. What should I watch for while using  this  medicine? Tell your doctor if your symptoms do not get better or if they get worse. Visit your doctor or health care professional for regular checks on your progress. Because it may take several weeks to see the full effects of this medicine, it is important to continue your treatment as prescribed by your doctor. Patients and their families should watch out for new or worsening thoughts of suicide or depression. Also watch out for sudden changes in feelings such as feeling anxious, agitated, panicky, irritable, hostile, aggressive, impulsive, severely restless, overly excited and hyperactive, or not being able to sleep. If this happens, especially at the beginning of treatment or after a change in dose, call your health care professional. Bonita QuinYou may get drowsy or dizzy. Do not drive, use machinery, or do anything that needs mental alertness until you know how this medicine affects you. Do not stand or sit up quickly, especially if you are an older patient. This reduces the risk of dizzy or fainting spells. Alcohol may interfere with the effect of this medicine. Avoid alcoholic drinks. This medicine can cause an increase in blood pressure. This medicine can also cause a sudden drop in your blood pressure, which may make you feel faint and increase the chance of a fall. These effects are most common when you first start the medicine or when the dose is increased, or during use of other medicines that can cause a sudden drop in blood pressure. Check with your doctor for instructions on monitoring your blood pressure while taking this medicine. Your mouth may get dry. Chewing sugarless gum or sucking hard candy, and drinking plenty of water may help. Contact your doctor if the problem does not go away or is severe. What side effects may I notice from receiving this medicine? Side effects that you should report to your doctor or health care professional as soon as possible: -allergic reactions like skin rash,  itching or hives, swelling of the face, lips, or tongue -anxious -breathing problems -confusion -changes in vision -chest pain -confusion -elevated mood, decreased need for sleep, racing thoughts, impulsive behavior -eye pain -fast, irregular heartbeat -feeling faint or lightheaded, falls -feeling agitated, angry, or irritable -hallucination, loss of contact with reality -high blood pressure -loss of balance or coordination -palpitations -redness, blistering, peeling or loosening of the skin, including inside the mouth -restlessness, pacing, inability to keep still -seizures -stiff muscles -suicidal thoughts or other mood changes -trouble passing urine or change in the amount of urine -trouble sleeping -unusual bleeding or bruising -unusually weak or tired -vomiting -yellowing of the eyes or skin Side effects that usually do not require medical attention (report to your doctor or health care professional if they continue or are bothersome): -change in sex drive or performance -change in appetite or weight -constipation -dizziness -dry mouth -headache -increased sweating -nausea -tired This list may not describe all possible side effects. Call your doctor for medical advice about side effects. You may report side effects to FDA at 1-800-FDA-1088. Where should I keep my medicine? Keep out of the reach of children. Store at room temperature between 20 and 25 degrees C (68 to 77 degrees F). Throw away any unused medicine after the expiration date. NOTE: This sheet is a summary. It may not cover all possible information. If you have questions about this medicine, talk to your doctor, pharmacist, or health care provider.  2019 [employer] Standard (2015-10-23 18:16:03)

## 2018-06-30 NOTE — Progress Notes (Addendum)
Andrew Soto. is a 85 y.o. male who presents to [facility] today for follow-up back pain and also discuss mood and treat syncope in clinic.  Sambath suffered a near syncopal event during clinic today.  Per his son he has been having episodes especially after meals where he gets very fatigued and seems to fall asleep and be difficult to arouse.  These tend to last for 10 to 15 minutes and by the time he gets treated with EMS or in the hospital his symptoms have resolved and he is back to his baseline.  He has a history of CVA and a pacemaker.  He has his first visit with cardiology coming up in about 2 weeks.  He takes medications listed below and notes that his pacemaker is supposed to pace at 60 beats a minute but he is not 100% sure.  In clinic today Andrew Soto developed significant weakness and fatigue.  He denies palpitations or chest pain during this event.  He had some abdominal discomfort that was relieved when he passed some gas and after about 30 minutes he started feeling better.  By the time of discharge he states that he is back to his baseline.  He cannot recall any change in his medications or different activity recently.  He has been taking short course of prednisone as discussed below.    Back pain: [name] was originally scheduled today to follow-up his worsening low back pain radiating to his legs bilaterally.  He has been seen by me several times and by my partner Dr. Benjamin Stain.  He developed low back pain with radicular symptoms after a fall since early December.  He has had trials of conservative management with Tylenol, physical therapy, and heating pad.  The pain is been slowly worsening.  He saw my partner Dr. Benjamin Stain on the 17th who ordered a noncontrast CT scan that showed no fractures but significant degenerative changes and spinal stenosis.   Unfortunately he was not able to have MRI as noted above he has a pacemaker.  Dr. Benjamin Stain prescribed a short course of prednisone for 5 days and he is here for follow-up.  Andrew Soto notes the back pain and radiating pain may have improved a little with the prednisone but is not sure.  He rates his pain is severe at times.  He denies any new significant lower extremity weakness or bowel bladder dysfunction.  Andrew Soto additionally notes constipation.  He notes that since he started taking tramadol for his back pain he has been having episodes of significant constipation.  He has been trying various over-the-counter laxatives and stool softeners including Colace and MiraLAX which sometimes help.  He denies any new fecal or urinary incontinence.  Psych: Andrew Soto notes that since he has been having severe pain and worsening overall health his mood has worsened.  He feels hopeless and despondent and that he will never feel good again.  He states that he would rather die than live like this but notes that he denies any active suicidal ideation or plan.  ROS as above:  Exam:  BP (!) 149/111   Pulse (!) 56   Ht 5\' 7"  (1.702 m)   Wt 152 lb (68.9 kg)   BMI 23.81 kg/m   Vitals:   06/30/18 1102 06/30/18 1157  BP: (!) 149/63 (!) 149/111  Pulse: 66 (!) 56  Weight: 152 lb (68.9 kg)   Height: 5\' 7"  (1.702 m)  Wt Readings from Last 5 Encounters:  06/30/18 152 lb (68.9 kg)  06/20/18 149 lb (67.6 kg)  06/05/18 146 lb (66.2 kg)  06/01/18 153 lb 1.6 oz (69.4 kg)  05/25/18 156 lb (70.8 kg)    Physical exam prior to syncopal episode Gen: Well NAD HEENT: EOMI,  MMM Lungs: Normal work of breathing. CTABL Heart: RRR no MRG Abd: NABS, Soft. Nondistended, Nontender Exts: Brisk capillary refill, warm and well perfused.  L-spine tender to palpation paraspinal musculature but nontender midline.  Decreased lumbar motion especially extension.  Lower extremity strength is slightly reduced bilaterally but stable  from prior exams.  Sensation is intact throughout. Neuropsych: Alert and oriented.  Flat affect tearful at times.  Physical exam during syncopal event: GEN: Arousable but fatigued appearing ill-appearing. Lungs: Normal work of breathing clear to auscultation bilaterally. Heart: Heart rate initially about 40 bpm auscultated and palpated.  No new murmurs rubs or gallops during syncopal event.  Towards the end of the syncopal event his heart rate had improved to about 60 beats a minute. Abdomen: Nontender nondistended. Neuropsych: Not alert but oriented.  Speech slow but no slurred speech.  Patient had decrease motion left arm during the event.  Detailed neuropsychological evaluation was not completed during the syncopal event.  Physical exam following syncopal event was unchanged from exam prior to event.  Lab and Radiology Results  Twelve-lead EKG: Heart rate 67 bpm.  Regular rhythm with significant PR interval.  No large pacer marks.  Significant left bundle branch block appearance.  New inverted ST segment of left bundle branch block compared to EKG October 2019.  Otherwise EKG not significantly changed.  EXAM: CT LUMBAR SPINE WITHOUT CONTRAST  TECHNIQUE: Multidetector CT imaging of the lumbar spine was performed without intravenous contrast administration. Multiplanar CT image reconstructions were also generated.  COMPARISON:  Lumbar radiographs dated 05/25/2018  FINDINGS: Segmentation: 5 lumbar type vertebrae.  Alignment: Slight lumbar scoliosis with convexity to the right centered at L3. Slight retrolisthesis of L3 on L4 and of L5 on S1. 3 mm spondylolisthesis of L4 on L5.  Vertebrae: No fracture or bone destruction.  Paraspinal and other soft tissues: 3.2 cm focal dilatation of the mid abdominal aorta. Extensive aortic atherosclerosis. Bilateral common iliac artery grafts.  Stent extending from the distal common bile duct into the duodenum. Cysts on the left  kidney.  Disc levels: T12-L1: Negative.  L1-2: Disc space narrowing. Small broad-based disc bulge with accompanying osteophytes with no neural impingement. Minimal bilateral facet arthritis.  L2-3: Severe disc space narrowing. Degenerative changes of the vertebral endplates. Small broad-based disc bulge asymmetric to the right. Congenitally short pedicles with hypertrophy of the ligamentum flavum and facet joints combine to create moderately severe spinal stenosis best seen on image 46 of series 4.  L3-4: Severe disc space narrowing with degenerative changes of the vertebral endplates. Slight retrolisthesis of L3 on L4. Endplate osteophytes compress the thecal sac symmetrically. Slight hypertrophy of the ligamentum flavum and facet joints combine to create mild to moderate spinal stenosis. Compression of both lateral recesses, left greater than right.  L4-5: Disc degeneration with a vacuum phenomenon. Small broad-based bulge of the uncovered disc asymmetric to the left. Marked hypertrophy of the ligamentum flavum, right greater than left as well as hypertrophy of the facet joints and congenitally short pedicles combine to create severe spinal stenosis with marked compression of the thecal sac, more so on the right than the left. Both lateral recesses are compressed. No foraminal stenosis.  L5-S1: Severe disc space narrowing with a vacuum phenomenon. Slight retrolisthesis of L5 on S1. Broad-based partially calcified small disc protrusion without focal neural impingement. Moderate right foraminal stenosis. Slight bilateral facet arthritis.  IMPRESSION: 1. Severe spinal stenosis at L4-5. 2. Moderately severe spinal stenosis at L2-3. 3. Moderate spinal stenosis at L3-4. 4. Extensive aortic atherosclerosis. 3.2 cm saccular abdominal aortic aneurysm. Recommend followup by ultrasound in 3 years. This recommendation follows ACR consensus guidelines: White Paper of the ACR  Incidental Findings Committee II on Vascular Findings. J Am Coll Radiol 2013; 10:789-794. Aortic aneurysm NOS (ICD10-I71.9)   Electronically Signed   By: Francene Boyers M.D.   On: 06/23/2018 13:00 I personally (independently) visualized and performed the interpretation of the images attached in this note.  Results for orders placed or performed in visit on 06/30/18 (from the past 24 hour(s))  POCT glucose (manual entry)     Status: Abnormal   Collection Time: 06/30/18  1:09 PM  Result Value Ref Range   POC Glucose 107 (A) 70 - 99 mg/dl    Assessment and Plan: 85 y.o. male with  Syncopal/near syncopal event: Etiology is somewhat unclear.  However glucose obtained during the time of the visit was normal.  My auscultated and palpated heart rate during the syncopal event was around 40s beats per minute however I did not take the time to get a good 30-60 second count.  His EKG is not much change from his baseline.  Additionally he has been having events like this over the last several weeks.  I am concerned that he may be having a pacer malfunction and am very happy that he has an initial consultation visit with EP cardiology scheduled for February 3.  I will send a copy of this note as well as a letter to Dr. Elberta Fortis in preparation for his upcoming visit and will order subsequent needed tests or procedures. Patient will continue his current medication and follow-up with me as scheduled on Monday, January 27.   Acute back pain with radicular symptoms: Slowly worsening.  CT scan shows diffuse degenerative changes and significant lumbar spinal stenosis.  He has loss of disc space at every level essentially except for L4-5.  I think it is reasonable to assume that he had a herniation at this level and is suffering lumbar radicular pain as a result.  Fortunately today he does not have severe new onset weakness or new bowel or bladder dysfunction.  Plan to order epidural steroid injection and  follow-up as scheduled with me on Monday, January 27.  Additionally pain is not controlled we will proceed with switching from tramadol to hydrocodone.  Discussed Tylenol dosing safety.  Additionally as noted below he may get have some benefit from Cymbalta as well.  Mood: Jhalen has symptoms consistent with major depressive disorder.  After discussion plan to start low-dose Cymbalta.  This may help his depressive symptoms as well as potentially some of his pain.  Plan to start low dose and titrate slowly.  Follow-up with me as scheduled in June or 27th.  Additionally I have made an appointment for Friday, January 31.  He also has a follow-up appointment scheduled with his PCP Riki Rusk on February 4.    Constipation: Discussed options.  Continue stool softener and try oral bisacodyl.  Recheck Monday and Friday next week as scheduled.  Continue to titrate dose.  Constipation very likely due to opiates.  I spent 40 minutes with this patient, greater than  50% was face-to-face time counseling regarding ddx, plan and options.  PDMP reviewed during this encounter. Orders Placed This Encounter  Procedures  . DG Epidurography    Order Specific Question:   Reason for Exam (SYMPTOM  OR DIAGNOSIS REQUIRED)    Answer:   L4-5 interlaminar    Order Specific Question:   Preferred imaging location?    Answer:   [id number]  . POCT glucose (manual entry)  . EKG 12-Lead   Meds ordered this encounter  Medications  . bisacodyl (BISACODYL) 5 MG EC tablet    Sig: Take 1 tablet (5 mg total) by mouth daily as needed for moderate constipation.    Dispense:  30 tablet    Refill:  0  . HYDROcodone-acetaminophen (NORCO/VICODIN) 5-325 MG tablet    Sig: Take 1 tablet by mouth every 6 (six) hours as needed.    Dispense:  30 tablet    Refill:  0    Replaces tramadol  . DULoxetine (CYMBALTA) 20 MG capsule    Sig: Take 1 capsule (20 mg total) by mouth daily.    Dispense:  30 capsule     Refill:  0  . AMBULATORY NON FORMULARY MEDICATION    Sig: Wheelchair simple. Use as needed for mobility.  Disp 1 R26.9    Dispense:  1 each    Refill:  0     Historical information moved to improve visibility of documentation.  Past Medical History:  Diagnosis Date  . Actinic keratoses   . Aortic stenosis   . Atrial fibrillation (HCC)   . CVA (cerebral vascular accident) (HCC)   . High cholesterol   . History of colon cancer   . History of CVA (cerebrovascular accident)   . Hypertension   . Lower urinary tract symptoms (LUTS)   . Pancreatitis, recurrent    Past Surgical History:  Procedure Laterality Date  . ABDOMINAL SURGERY    . CHOLECYSTECTOMY    . COLON RESECTION    . MOHS SURGERY     Right eyebrow  . PACEMAKER IMPLANT    . VALVE REPLACEMENT     Social History   Tobacco Use  . Smoking status: Former Smoker    Packs/day: 1.00    Years: 5.00    Pack years: 5.00    Types: Cigarettes  . Smokeless tobacco: Never Used  Substance Use Topics  . Alcohol use: Yes    Comment: glass wine daily   family history includes Testicular cancer in his father.  Medications: Current Outpatient Medications  Medication Sig Dispense Refill  . amiodarone (PACERONE) 200 MG tablet Take 1 tablet (200 mg total) by mouth daily. 90 tablet 1  . aspirin 81 MG tablet Take 81 mg by mouth daily.     . Cholecalciferol (VITAMIN D3 PO) Take 1 tablet by mouth daily.    . cyanocobalamin ([employer] VITAMIN B12) 1000 MCG tablet Take 1,000 mcg by mouth daily.     . cyclobenzaprine (FLEXERIL) 5 MG tablet Take 1-2 tablets (5-10 mg total) by mouth 3 (three) times daily as needed for muscle spasms. Caution: can cause drowsiness 60 tablet 0  . diclofenac sodium (VOLTAREN) 1 % GEL Apply 4 g topically 4 (four) times daily. To affected joint. 400 g 11  . ezetimibe (ZETIA) 10 MG tablet Take 1 tablet (10 mg total) by mouth daily. 90 tablet 1  . tamsulosin (FLOMAX) 0.4 MG CAPS capsule Take 1 capsule (0.4 mg total)  by mouth daily. 90 capsule 1  .  valsartan (DIOVAN) 40 MG tablet Take 1 tablet (40 mg total) by mouth every morning. 90 tablet 0  . AMBULATORY NON FORMULARY MEDICATION Wheelchair simple. Use as needed for mobility.  Disp 1 R26.9 1 each 0  . bisacodyl (BISACODYL) 5 MG EC tablet Take 1 tablet (5 mg total) by mouth daily as needed for moderate constipation. 30 tablet 0  . DULoxetine (CYMBALTA) 20 MG capsule Take 1 capsule (20 mg total) by mouth daily. 30 capsule 0  . HYDROcodone-acetaminophen (NORCO/VICODIN) 5-325 MG tablet Take 1 tablet by mouth every 6 (six) hours as needed. 30 tablet 0   No current facility-administered medications for this visit.    Allergies  Allergen Reactions  . Epinephrine Other (See Comments)    Dizziness/double vision  . Penicillins Rash     Discussed warning signs or symptoms. Please see discharge instructions. Patient expresses understanding.

## 2018-07-03 ENCOUNTER — Ambulatory Visit: Payer: Medicare Other | Admitting: Family Medicine

## 2018-07-03 ENCOUNTER — Encounter: Payer: Self-pay | Admitting: Physician Assistant

## 2018-07-03 ENCOUNTER — Encounter: Payer: Self-pay | Admitting: Family Medicine

## 2018-07-03 ENCOUNTER — Ambulatory Visit (INDEPENDENT_AMBULATORY_CARE_PROVIDER_SITE_OTHER): Payer: Medicare Other | Admitting: Family Medicine

## 2018-07-03 VITALS — BP 165/72 | HR 60 | Wt 152.0 lb

## 2018-07-03 DIAGNOSIS — Z950 Presence of cardiac pacemaker: Secondary | ICD-10-CM

## 2018-07-03 DIAGNOSIS — F321 Major depressive disorder, single episode, moderate: Secondary | ICD-10-CM | POA: Diagnosis not present

## 2018-07-03 DIAGNOSIS — M48061 Spinal stenosis, lumbar region without neurogenic claudication: Secondary | ICD-10-CM | POA: Diagnosis not present

## 2018-07-03 MED ORDER — OXYCODONE HCL 5 MG PO TABS: 5.0000 mg | ORAL_TABLET | Freq: Four times a day (QID) | ORAL | 0 refills | Status: DC | PRN

## 2018-07-03 NOTE — Progress Notes (Signed)
Andrew Soto. is a 85 y.o. male who presents to [facility] today for follow-up on back pain, depression, and syncope.   Back pain: Andrew Soto was started on hydrocodone on Friday for his back pain. He has not had any pain relief with this medication. His son reports that he has been "acting like a zombie". He has been sleeping a lot, had difficulty articulating his thoughts, and has felt nauseous since starting hydrocodone. Andrew Soto recalled that he had these same side effects of hydrocodone in the past. They are currently scheduling his epidural injection.    Depression: Andrew Soto was started on Cymbalta for MDD at his visit on Friday (01/24). He has been unable to tell a difference so far, but plans to continue this medication.  Syncope: Andrew Soto had been near syncopal episodes for some time prior to Friday, and he had a near syncopal episode in the office on 01/24. He has not had any syncopal episodes since then. He is scheduled to see Dr. Elberta Fortis on 2/3 due to concern for pacemaker malfunction.    ROS:  As above  Exam:  BP (!) 165/72   Pulse 60   Wt 152 lb (68.9 kg)   BMI 23.81 kg/m  Wt Readings from Last 5 Encounters:  07/03/18 152 lb (68.9 kg)  06/30/18 152 lb (68.9 kg)  06/20/18 149 lb (67.6 kg)  06/05/18 146 lb (66.2 kg)  06/01/18 153 lb 1.6 oz (69.4 kg)   General: Well Developed, well nourished, and in no acute distress.  Neuro/Psych: Alert and oriented x3, extra-ocular muscles intact, able to move all 4 extremities, sensation grossly intact.  Normal  speech thought process.  Affect flat. Skin: Warm and dry, no rashes noted.  Respiratory: Not using accessory muscles, speaking in full sentences, trachea midline.  Lung fields clear to auscultation bilaterally Cardiovascular: Pulses palpable, no extremity edema.  Regular rate and rhythms no murmur gallops rubs or gallops. Abdomen: Does not appear  distended   Lab and Radiology Results No results found for this or any previous visit (from the past 72 hour(s)). No results found.  CT LUMBAR SPINE WITHOUT CONTRAST  TECHNIQUE: Multidetector CT imaging of the lumbar spine was performed without intravenous contrast administration. Multiplanar CT image reconstructions were also generated.  COMPARISON: Lumbar radiographs dated 05/25/2018  FINDINGS: Segmentation: 5 lumbar type vertebrae.  Alignment: Slight lumbar scoliosis with convexity to the right centered at L3. Slight retrolisthesis of L3 on L4 and of L5 on S1. 3 mm spondylolisthesis of L4 on L5.  Vertebrae: No fracture or bone destruction.  Paraspinal and other soft tissues: 3.2 cm focal dilatation of the mid abdominal aorta. Extensive aortic atherosclerosis. Bilateral common iliac artery grafts.  Stent extending from the distal common bile duct into the duodenum. Cysts on the left kidney.  Disc levels: T12-L1: Negative.  L1-2: Disc space narrowing. Small broad-based disc bulge with accompanying osteophytes with no neural impingement. Minimal bilateral facet arthritis.  L2-3: Severe disc space narrowing. Degenerative changes of the vertebral endplates. Small broad-based disc bulge asymmetric to the right. Congenitally short pedicles with hypertrophy of the ligamentum flavum and facet joints combine to create moderately severe spinal stenosis best seen on image 46 of series 4.  L3-4: Severe disc space narrowing with degenerative changes of the vertebral endplates. Slight retrolisthesis of L3 on L4. Endplate osteophytes compress the thecal sac symmetrically. Slight hypertrophy of the ligamentum flavum and facet joints combine to create mild to  moderate spinal stenosis. Compression of both lateral recesses, left greater than right.  L4-5: Disc degeneration with a vacuum phenomenon. Small broad-based bulge of the uncovered disc asymmetric to the left.  Marked hypertrophy of the ligamentum flavum, right greater than left as well as hypertrophy of the facet joints and congenitally short pedicles combine to create severe spinal stenosis with marked compression of the thecal sac, more so on the right than the left. Both lateral recesses are compressed. No foraminal stenosis.  L5-S1: Severe disc space narrowing with a vacuum phenomenon. Slight retrolisthesis of L5 on S1. Broad-based partially calcified small disc protrusion without focal neural impingement. Moderate right foraminal stenosis. Slight bilateral facet arthritis.  IMPRESSION: 1. Severe spinal stenosis at L4-5. 2. Moderately severe spinal stenosis at L2-3. 3. Moderate spinal stenosis at L3-4. 4. Extensive aortic atherosclerosis. 3.2 cm saccular abdominal aortic aneurysm. Recommend followup by ultrasound in 3 years. This recommendation follows ACR consensus guidelines: White Paper of the ACR Incidental Findings Committee II on Vascular Findings. J Am Coll Radiol 2013; 10:789-794. Aortic aneurysm NOS (ICD10-I71.9)   Electronically Signed By: Francene BoyersJames Maxwell M.D. On: 06/23/2018 13:00 I personally (independently) visualized and performed the interpretation of the images attached in this note.    Assessment and Plan: 85 y.o. male with  Back pain: Andrew Soto has not had any improvements in back pain with the hydrocodone. He has felt nauseous, has been sleeping more often, and has had difficulty articulating his thoughts since starting hydrocodone. Will switch him to oxycodone and see if he does better on this. He can take extended release Tylenol with the oxycodone. He is going to get an epidural injection soon. Back-up plan if no improvement is to consider morphine and/or facet injection.   Depression: No improvements in mood as of yet since he has only been taking it three days. Continue to monitor. Continue Cymbalta.  Syncope: No near syncopal or syncopal episodes  since Friday. Will send notes to cardiology regarding his near syncopal event on Friday. Cardiology appointment on Monday. Follow-up on this appointment.   PDMP reviewed during this encounter. No orders of the defined types were placed in this encounter.  Meds ordered this encounter  Medications  . oxyCODONE (ROXICODONE) 5 MG immediate release tablet    Sig: Take 1 tablet (5 mg total) by mouth every 6 (six) hours as needed for severe pain.    Dispense:  30 tablet    Refill:  0    Pt intolerant to Hydrocodone. Will take with tylenol    Historical information moved to improve visibility of documentation.  Past Medical History:  Diagnosis Date  . Actinic keratoses   . Aortic stenosis   . Atrial fibrillation (HCC)   . CVA (cerebral vascular accident) (HCC)   . High cholesterol   . History of colon cancer   . History of CVA (cerebrovascular accident)   . Hypertension   . Lower urinary tract symptoms (LUTS)   . Pancreatitis, recurrent    Past Surgical History:  Procedure Laterality Date  . ABDOMINAL SURGERY    . CHOLECYSTECTOMY    . COLON RESECTION    . MOHS SURGERY     Right eyebrow  . PACEMAKER IMPLANT    . VALVE REPLACEMENT     Social History   Tobacco Use  . Smoking status: Former Smoker    Packs/day: 1.00    Years: 5.00    Pack years: 5.00    Types: Cigarettes  . Smokeless tobacco: Never Used  Substance Use Topics  . Alcohol use: Yes    Comment: glass wine daily   family history includes Testicular cancer in his father.  Medications: Current Outpatient Medications  Medication Sig Dispense Refill  . AMBULATORY NON FORMULARY MEDICATION Wheelchair simple. Use as needed for mobility.  Disp 1 R26.9 1 each 0  . amiodarone (PACERONE) 200 MG tablet Take 1 tablet (200 mg total) by mouth daily. 90 tablet 1  . aspirin 81 MG tablet Take 81 mg by mouth daily.     . bisacodyl (BISACODYL) 5 MG EC tablet Take 1 tablet (5 mg total) by mouth daily as needed for moderate  constipation. 30 tablet 0  . Cholecalciferol (VITAMIN D3 PO) Take 1 tablet by mouth daily.    . cyanocobalamin ([employer]S VITAMIN B12) 1000 MCG tablet Take 1,000 mcg by mouth daily.     . cyclobenzaprine (FLEXERIL) 5 MG tablet Take 1-2 tablets (5-10 mg total) by mouth 3 (three) times daily as needed for muscle spasms. Caution: can cause drowsiness 60 tablet 0  . diclofenac sodium (VOLTAREN) 1 % GEL Apply 4 g topically 4 (four) times daily. To affected joint. 400 g 11  . DULoxetine (CYMBALTA) 20 MG capsule Take 1 capsule (20 mg total) by mouth daily. 30 capsule 0  . ezetimibe (ZETIA) 10 MG tablet Take 1 tablet (10 mg total) by mouth daily. 90 tablet 1  . tamsulosin (FLOMAX) 0.4 MG CAPS capsule Take 1 capsule (0.4 mg total) by mouth daily. 90 capsule 1  . valsartan (DIOVAN) 40 MG tablet Take 1 tablet (40 mg total) by mouth every morning. 90 tablet 0  . oxyCODONE (ROXICODONE) 5 MG immediate release tablet Take 1 tablet (5 mg total) by mouth every 6 (six) hours as needed for severe pain. 30 tablet 0   No current facility-administered medications for this visit.    Allergies  Allergen Reactions  . Epinephrine Other (See Comments)    Dizziness/double vision  . Penicillins Rash      Discussed warning signs or symptoms. Please see discharge instructions. Patient expresses understanding.  I personally was present and performed or re-performed the history, physical exam and medical decision-making activities of this service and have verified that the service and findings are accurately documented in the student's note. ___________________________________________ Clementeen GrahamEvan Mathan Darroch M.D., ABFM., CAQSM. Primary Care and Sports Medicine Adjunct Instructor of Family Medicine  [employer]

## 2018-07-03 NOTE — Patient Instructions (Signed)
Thank you for coming in today. Proceed with back injection.  STOP hydrocodone.  Start oxycodone with tylenol.  Continue cymbalta.  I will let you know if I hear from Cardiology.    Keep appointment Friday.

## 2018-07-07 ENCOUNTER — Ambulatory Visit: Payer: Medicare Other | Admitting: Family Medicine

## 2018-07-10 ENCOUNTER — Encounter: Payer: Self-pay | Admitting: Cardiology

## 2018-07-10 ENCOUNTER — Ambulatory Visit
Admission: RE | Admit: 2018-07-10 | Discharge: 2018-07-10 | Disposition: A | Discharge status: Home / Self Care | Payer: Medicare Other | Source: Ambulatory Visit | Attending: Family Medicine | Admitting: Family Medicine

## 2018-07-10 ENCOUNTER — Ambulatory Visit (INDEPENDENT_AMBULATORY_CARE_PROVIDER_SITE_OTHER): Payer: Medicare Other | Admitting: Cardiology

## 2018-07-10 VITALS — BP 136/64 | HR 66 | Ht 67.0 in | Wt 151.0 lb

## 2018-07-10 DIAGNOSIS — I480 Paroxysmal atrial fibrillation: Secondary | ICD-10-CM | POA: Diagnosis not present

## 2018-07-10 MED ORDER — METHYLPREDNISOLONE ACETATE 40 MG/ML INJ SUSP (RADIOLOG
120.0000 mg | Freq: Once | INTRAMUSCULAR | Status: AC
  Administered 2018-07-10: 120 mg via EPIDURAL

## 2018-07-10 MED ORDER — IOPAMIDOL (ISOVUE-M 200) INJECTION 41%
1.0000 mL | Freq: Once | INTRAMUSCULAR | Status: AC
  Administered 2018-07-10: 1 mL via EPIDURAL

## 2018-07-10 NOTE — Patient Instructions (Addendum)
Medication Instructions:  Your physician recommends that you continue on your current medications as directed. Please refer to the Current Medication list given to you today.  *If you need a refill on your cardiac medications before your next appointment, please call your pharmacy*  Labwork: None ordered  Testing/Procedures: None ordered  Follow-Up: Remote monitoring is used to monitor your Pacemaker or ICD from home. This monitoring reduces the number of office visits required to check your device to one time per year. It allows [place] to keep an eye on the functioning of your device to ensure it is working properly. You are scheduled for a device check from home on 10/16/2018. You may send your transmission at any time that day. If you have a wireless device, the transmission will be sent automatically. After your physician reviews your transmission, you will receive a postcard with your next transmission date.  Your physician recommends that you schedule a follow-up appointment in: 3 months with Dr. Elberta Fortis.   Thank you for choosing [facility]!!   Dory Horn, RN [phone]

## 2018-07-10 NOTE — Progress Notes (Signed)
Electrophysiology Office Note   Date:  07/10/2018   ID:  Andrew Soto., DOB [birth year], MRN [id number]  PCP:  Carlis Stable, PA-C  Cardiologist:  Jens Som Primary Electrophysiologist:  Nevia Henkin Andrew Loa, MD    No chief complaint on file.    History of Present Illness: Andrew Soto. is a 85 y.o. male who is being seen today for the evaluation of atrial fibrilllation, pacemaker at the request of Olga Millers. Presenting today for electrophysiology evaluation.  Has a history for atrial fibrillation, CVA, hyperlipidemia, hypertension.  He also has a history of falls.  He fell in October 2019 and had a large subcutaneous lateral hip hematoma as well as a left scalp hematoma.  He was previously not anticoagulated by his cardiologist prior to being transferred to our system.  He has had multiple syncopal episodes over the last few years.  His syncopal episodes are usually associated with meals, but he recently had one while at his primary physician's office.  During these episodes, his son says that someone felt a pulse which was quite thready.  The episodes last for between 10 and 15 minutes.  At his primary physician's office, he developed weakness and fatigue.  He had no palpitations or chest pain.  Glucose obtained during the visit was normal.  His heart rate was found to be around 40 bpm per his primary physician.  Today, he denies symptoms of palpitations, chest pain, shortness of breath, orthopnea, PND, lower extremity edema, claudication, dizziness, presyncope, syncope, bleeding, or neurologic sequela. The patient is tolerating medications without difficulties.    Past Medical History:  Diagnosis Date  . Actinic keratoses   . Aortic stenosis   . Atrial fibrillation (HCC)   . CVA (cerebral vascular accident) (HCC)   . High cholesterol   . History of colon cancer   . History of CVA (cerebrovascular accident)   . Hypertension   . Lower urinary tract  symptoms (LUTS)   . Pancreatitis, recurrent    Past Surgical History:  Procedure Laterality Date  . ABDOMINAL SURGERY    . CHOLECYSTECTOMY    . COLON RESECTION    . MOHS SURGERY     Right eyebrow  . PACEMAKER IMPLANT    . VALVE REPLACEMENT       Current Outpatient Medications  Medication Sig Dispense Refill  . AMBULATORY NON FORMULARY MEDICATION Wheelchair simple. Use as needed for mobility.  Disp 1 R26.9 1 each 0  . amiodarone (PACERONE) 200 MG tablet Take 1 tablet (200 mg total) by mouth daily. 90 tablet 1  . aspirin 81 MG tablet Take 81 mg by mouth daily.     . bisacodyl (DULCOLAX) 5 MG EC tablet Take 5 mg by mouth 2 (two) times daily.    . Cholecalciferol (VITAMIN D3 PO) Take 1 tablet by mouth daily.    . cyanocobalamin ([employer]S VITAMIN B12) 1000 MCG tablet Take 1,000 mcg by mouth daily.     . diclofenac sodium (VOLTAREN) 1 % GEL Apply 4 g topically 4 (four) times daily. To affected joint. 400 g 11  . DULoxetine (CYMBALTA) 20 MG capsule Take 1 capsule (20 mg total) by mouth daily. 30 capsule 0  . ezetimibe (ZETIA) 10 MG tablet Take 1 tablet (10 mg total) by mouth daily. 90 tablet 1  . oxyCODONE (ROXICODONE) 5 MG immediate release tablet Take 1 tablet (5 mg total) by mouth every 6 (six) hours as needed for severe pain. 30 tablet 0  .  tamsulosin (FLOMAX) 0.4 MG CAPS capsule Take 1 capsule (0.4 mg total) by mouth daily. 90 capsule 1  . valsartan (DIOVAN) 40 MG tablet Take 1 tablet (40 mg total) by mouth every morning. 90 tablet 0   No current facility-administered medications for this visit.     Allergies:   Epinephrine and Penicillins   Social History:  The patient  reports that he has quit smoking. His smoking use included cigarettes. He has a 5.00 pack-year smoking history. He has never used smokeless tobacco. He reports current alcohol use. He reports that he does not use drugs.   Family History:  The patient's family history includes Testicular cancer in his father.     ROS:  Please see the history of present illness.   Otherwise, review of systems is positive for none.   All other systems are reviewed and negative.    PHYSICAL EXAM: VS:  BP 136/64   Pulse 66   Ht 5\' 7"  (1.702 m)   Wt 151 lb (68.5 kg)   SpO2 96%   BMI 23.65 kg/m  , BMI Body mass index is 23.65 kg/m. GEN: Well nourished, well developed, in no acute distress  HEENT: normal  Neck: no JVD, carotid bruits, or masses Cardiac: RRR; no murmurs, rubs, or gallops,no edema  Respiratory:  clear to auscultation bilaterally, normal work of breathing GI: soft, nontender, nondistended, + BS MS: no deformity or atrophy  Skin: warm and dry,  device pocket is well healed Neuro:  Strength and sensation are intact Psych: euthymic mood, full affect  EKG:  EKG is not ordered today. Personal review of the ekg ordered 06/30/17 shows sinus rhythm, first-degree AV block, left bundle branch block  Device interrogation is reviewed today in detail.  See PaceArt for details.   Recent Labs: 03/28/2018: ALT 16; BUN 15; Creatinine, Ser 1.09; Hemoglobin 13.7; Platelets 190; Potassium 3.9; Sodium 136 04/26/2018: TSH 5.62    Lipid Panel  No results found for: CHOL, TRIG, HDL, CHOLHDL, VLDL, LDLCALC, LDLDIRECT   Wt Readings from Last 3 Encounters:  07/10/18 151 lb (68.5 kg)  07/03/18 152 lb (68.9 kg)  06/30/18 152 lb (68.9 kg)      Other studies Reviewed: Additional studies/ records that were reviewed today include: TTE 05/12/18  Review of the above records today demonstrates:  - Left ventricle: The cavity size was normal. Wall thickness was   increased in a pattern of mild LVH. Systolic function was normal.   The estimated ejection fraction was in the range of 60% to 65%.   Wall motion was normal; there were no regional wall motion   abnormalities. - Aortic valve: There was mild regurgitation. Valve area (VTI):   0.87 cm^2. Valve area (Vmax): 0.8 cm^2. Valve area (Vmean): 0.78   cm^2. -  Mitral valve: Calcified annulus. Valve area by pressure   half-time: 1.64 cm^2. Valve area by continuity equation (using   LVOT flow): 0.69 cm^2. - Left atrium:  The atrium was normal in size.  ASSESSMENT AND PLAN:  1.  Paroxysmal atrial fibrillation: Currently on amiodarone.  Not anticoagulated due to frequent falls.  In sinus rhythm today.  No changes.  2.  Hypertension: Mildly elevated.  Has been more normal in the past.  No changes.  4.  Syncope possibly vasovagal:  [facility].  It is unclear to me as to the causes of his syncope.  His symptoms do appear to be vagal.  He did have his pulse checked by  his primary physician who found it to be in the 40s, but he does have a pacemaker which is functioning appropriately and is set to VVI 60.  We have turned his rate drop feature on.  This Andrew Soto hopefully help with any episodes where his heart rate falls.  This Andrew Soto pace his heart faster when the device senses a rapid fall and heart rate.  Due to syncope, I have told him no driving for 6 months per [facility].  Current medicines are reviewed at length with the patient today.   The patient does not have concerns regarding his medicines.  The following changes were made today:  none  Labs/ tests ordered today include:  No orders of the defined types were placed in this encounter.  Is discussed with primary physician  Disposition:   FU with Andrew Soto 3 months  Signed, Andrew Yom Andrew Loa, MD  07/10/2018 1:57 PM     [facility] [address] [place] [address] [phone] (office) [phone] (fax)

## 2018-07-10 NOTE — Discharge Instructions (Signed)

## 2018-07-11 ENCOUNTER — Ambulatory Visit: Payer: Medicare Other | Admitting: Physician Assistant

## 2018-07-12 ENCOUNTER — Encounter: Payer: Self-pay | Admitting: Physician Assistant

## 2018-07-12 ENCOUNTER — Ambulatory Visit (INDEPENDENT_AMBULATORY_CARE_PROVIDER_SITE_OTHER): Payer: Medicare Other | Admitting: Physician Assistant

## 2018-07-12 VITALS — BP 153/66 | HR 58 | Temp 97.7°F

## 2018-07-12 DIAGNOSIS — Z79899 Other long term (current) drug therapy: Secondary | ICD-10-CM | POA: Diagnosis not present

## 2018-07-12 DIAGNOSIS — K5903 Drug induced constipation: Secondary | ICD-10-CM

## 2018-07-12 DIAGNOSIS — Z87898 Personal history of other specified conditions: Secondary | ICD-10-CM | POA: Insufficient documentation

## 2018-07-12 DIAGNOSIS — I10 Essential (primary) hypertension: Secondary | ICD-10-CM | POA: Diagnosis not present

## 2018-07-12 DIAGNOSIS — M48061 Spinal stenosis, lumbar region without neurogenic claudication: Secondary | ICD-10-CM

## 2018-07-12 DIAGNOSIS — T402X5A Adverse effect of other opioids, initial encounter: Secondary | ICD-10-CM

## 2018-07-12 DIAGNOSIS — F329 Major depressive disorder, single episode, unspecified: Secondary | ICD-10-CM | POA: Diagnosis not present

## 2018-07-12 DIAGNOSIS — R110 Nausea: Secondary | ICD-10-CM | POA: Insufficient documentation

## 2018-07-12 DIAGNOSIS — F32A Depression, unspecified: Secondary | ICD-10-CM | POA: Insufficient documentation

## 2018-07-12 MED ORDER — OXYCODONE-ACETAMINOPHEN 5-325 MG PO TABS: 1.0000 | ORAL_TABLET | Freq: Four times a day (QID) | ORAL | 0 refills | Status: AC | PRN

## 2018-07-12 MED ORDER — NALOXEGOL OXALATE 25 MG PO TABS: 25.0000 mg | ORAL_TABLET | Freq: Every day | ORAL | 3 refills | Status: DC

## 2018-07-12 MED ORDER — VALSARTAN 40 MG PO TABS: 40.0000 mg | ORAL_TABLET | ORAL | 0 refills | Status: DC

## 2018-07-12 NOTE — Addendum Note (Signed)
Addended by: Gena Fray E on: 07/12/2018 02:11 PM   Modules accepted: Orders

## 2018-07-12 NOTE — Progress Notes (Addendum)
HPI:                                                                Andrew IllJames M Headrick Jr. is a 85 y.o. male [facility] today for follow-up  History is provided by patient and his son.  Hx of Syncope: syncopal event in office on 06/30/18. This has been a recurrent problem. He followed up with Dr. Elberta Fortisamnitz on Monday and had his pacemaker adjusted to counter-act vasovagal response. They also activated a recording function. Family is hopeful this will correct this problem going forward.  Chronic pain: 2/2 lumbar spinal stenosis. Following with Dr. Denyse Amassorey (Sports Medicine). Currently taking oxycodone 5 mg 2-3 times per day. Son states he is typically taking 1 in the morning and 1 at night since his epidural.  He had intolerance with hydrocodone, son reports "he was like a zombie."  Patient reports he never wants to take that medication again. Son states they have 4 tablets left and would like a refill before the weekend.  Constipation: Opioid-induced. Taking Dulcolax twice a day. Last BM "probably a week ago." States he is straining and struggling to have a BM. Miralax and milk of magnesia not helpful. C/o mild nausea today. Son reports he has noticed that every time he is in the doctor's office he seems to become nauseous.   Depression: related to chronic pain. Andrew FearingJames denies any prior hx of depression. He states he feels "lacksidasical" and he doesn't really know why.  He was started on Cymbalta and has been taking 20 mg for approximately 2 weeks.  Has not noticed much change in his mood  HTN: taking Valsartan daily. Compliant with medications. Does not check BP's at home. Denies vision change, headache, chest pain with exertion, orthopnea, lightheadedness, syncope and edema.     Past Medical History:  Diagnosis Date  . Actinic keratoses   . Aortic stenosis   . Atrial fibrillation (HCC)   . CVA (cerebral vascular accident) (HCC)   .  High cholesterol   . History of colon cancer   . History of CVA (cerebrovascular accident)   . Hypertension   . Lower urinary tract symptoms (LUTS)   . Pancreatitis, recurrent    Past Surgical History:  Procedure Laterality Date  . ABDOMINAL SURGERY    . CHOLECYSTECTOMY    . COLON RESECTION    . MOHS SURGERY     Right eyebrow  . PACEMAKER IMPLANT    . VALVE REPLACEMENT     Social History   Tobacco Use  . Smoking status: Former Smoker    Packs/day: 1.00    Years: 5.00    Pack years: 5.00    Types: Cigarettes  . Smokeless tobacco: Never Used  Substance Use Topics  . Alcohol use: Yes    Comment: glass wine daily   family history includes Testicular cancer in his father.    ROS: negative except as noted in the HPI  Medications: Current Outpatient Medications  Medication Sig Dispense Refill  . AMBULATORY NON FORMULARY MEDICATION Wheelchair simple. Use as needed for mobility.  Disp 1 R26.9 1 each 0  . amiodarone (PACERONE) 200 MG tablet Take 1 tablet (200 mg total) by mouth daily. 90 tablet  1  . aspirin 81 MG tablet Take 81 mg by mouth daily.     . bisacodyl (DULCOLAX) 5 MG EC tablet Take 5 mg by mouth 2 (two) times daily.    . Cholecalciferol (VITAMIN D3 PO) Take 1 tablet by mouth daily.    . cyanocobalamin ([employer]S VITAMIN B12) 1000 MCG tablet Take 1,000 mcg by mouth daily.     . diclofenac sodium (VOLTAREN) 1 % GEL Apply 4 g topically 4 (four) times daily. To affected joint. 400 g 11  . DULoxetine (CYMBALTA) 20 MG capsule Take 1 capsule (20 mg total) by mouth daily. 30 capsule 0  . ezetimibe (ZETIA) 10 MG tablet Take 1 tablet (10 mg total) by mouth daily. 90 tablet 1  . oxyCODONE (ROXICODONE) 5 MG immediate release tablet Take 1 tablet (5 mg total) by mouth every 6 (six) hours as needed for severe pain. 30 tablet 0  . tamsulosin (FLOMAX) 0.4 MG CAPS capsule Take 1 capsule (0.4 mg total) by mouth daily. 90 capsule 1  . valsartan (DIOVAN) 40 MG tablet Take 1 tablet (40 mg  total) by mouth every morning. 90 tablet 0  . naloxegol oxalate (MOVANTIK) 25 MG TABS tablet Take 1 tablet (25 mg total) by mouth daily after breakfast. Take 1h before or 2h after meal 30 tablet 3  . oxyCODONE-acetaminophen (PERCOCET/ROXICET) 5-325 MG tablet Take 1 tablet by mouth every 6 (six) hours as needed for up to 5 days for moderate pain or severe pain. 20 tablet 0   No current facility-administered medications for this visit.    Allergies  Allergen Reactions  . Epinephrine Other (See Comments)    Dizziness/double vision  . Penicillins Rash  . Vicodin [Hydrocodone-Acetaminophen] Nausea Only    Mood changes, Fatigue       Objective:  BP (!) 153/66   Pulse (!) 58   Temp 97.7 F (36.5 C) (Oral)   SpO2 98%  Gen:  alert, not ill-appearing, no distress, appropriate for age HEENT: head normocephalic without obvious abnormality, conjunctiva and cornea clear, trachea midline Pulm: Normal work of breathing, normal phonation GI: Abdomen soft, nontender, nondistended Neuro: alert and oriented x 3, no tremor MSK: extremities atraumatic, normal gait and station Skin: intact, no rashes on exposed skin, no jaundice, no cyanosis Psych: well-groomed, cooperative, good eye contact, depressed mood, affect mood-congruent, speech is articulate, and thought processes clear and goal-directed  Lab Results  Component Value Date   CREATININE 1.09 03/28/2018   BUN 15 03/28/2018   NA 136 03/28/2018   K 3.9 03/28/2018   CL 105 03/28/2018   CO2 25 03/28/2018   Lab Results  Component Value Date   WBC 10.8 (H) 03/28/2018   HGB 13.7 03/28/2018   HCT 42.0 03/28/2018   MCV 95.0 03/28/2018   PLT 190 03/28/2018      No results found for this or any previous visit (from the past 72 hour(s)). Dg Inject Diag/thera/inc Needle/cath/plc Epi/lumb/sac W/img  Result Date: 07/10/2018 CLINICAL DATA:  Spondylosis without myelopathy. Spinal stenosis at L4-5 worse on the right. Right radicular pain.  FLUOROSCOPY TIME:  0 minutes 25 seconds. 20.92 micro gray meter squared PROCEDURE: The procedure, risks, benefits, and alternatives were explained to the patient. Questions regarding the procedure were encouraged and answered. The patient understands and consents to the procedure. LUMBAR EPIDURAL INJECTION: An interlaminar approach was performed on the right at L5-S1. The overlying skin was cleansed and anesthetized. A 20 gauge epidural needle was advanced using loss-of-resistance technique. DIAGNOSTIC EPIDURAL  INJECTION: Injection of Isovue-M 200 shows a good epidural pattern with spread above and below the level of needle placement, primarily on the right. No vascular opacification is seen. THERAPEUTIC EPIDURAL INJECTION: One hundred twenty mg of Depo-Medrol mixed with 2 cc 1% lidocaine were instilled. The procedure was well-tolerated, and the patient was discharged thirty minutes following the injection in good condition. COMPLICATIONS: None IMPRESSION: Technically successful epidural injection on the right at L5-S1 # 1. Electronically Signed   By: Paulina Fusi M.D.   On: 07/10/2018 12:35      Assessment and Plan: 85 y.o. male with   .Torance was seen today for follow-up.  Diagnoses and all orders for this visit:  Encounter for long-term (current) use of medications  Hypertension goal BP (blood pressure) < 140/90 -     valsartan (DIOVAN) 40 MG tablet; Take 1 tablet (40 mg total) by mouth every morning.  Depression, unspecified depression type  Spinal stenosis of lumbar region without neurogenic claudication -     oxyCODONE-acetaminophen (PERCOCET/ROXICET) 5-325 MG tablet; Take 1 tablet by mouth every 6 (six) hours as needed for up to 5 days for moderate pain or severe pain.  Nausea without vomiting  Therapeutic opioid induced constipation -     naloxegol oxalate (MOVANTIK) 25 MG TABS tablet; Take 1 tablet (25 mg total) by mouth daily after breakfast. Take 1h before or 2h after  meal  History of syncope   Nausea: Likely due to constipation, polypharmacy including oxycodone also likely contributory.  Will avoid Zofran to prevent worsening constipation.  He is still eating and drinking normally.  Chronic pain: switched from Roxicodone to Percocet. Decreased quantity to 20 mg. Due to age, polypharmacy and constipation, I would like to avoid chronic opioid use as much as possible. He is getting partial pain relief with the epidural. Counseled son to limit use to moderate-severe pain as needed.  Opioid-induced constipation: Has tried and failed multiple over-the-counter laxatives including milk of magnesia, Dulcolax, MiraLAX.  Starting Movantik 25 mg 2 hours after a meal. Stop OTC laxatives.  Depression: No acute safety issues.  Has only been taking Cymbalta 20 mg for 2 weeks.  We will leave current dose the same for now due to nausea of unclear etiology.  Option to increase to 40 mg daily if needed.   Hypertension: Blood pressure mildly out of range in office today.  May be due to back pain.  Continue valsartan at current dose.  Patient education and anticipatory guidance given Patient agrees with treatment plan Follow-up in 1 month or sooner as needed if symptoms worsen or fail to improve  Levonne Hubert PA-C

## 2018-07-14 ENCOUNTER — Ambulatory Visit: Payer: Medicare Other | Admitting: Physician Assistant

## 2018-07-14 ENCOUNTER — Other Ambulatory Visit: Payer: Self-pay | Admitting: Physician Assistant

## 2018-07-14 DIAGNOSIS — R399 Unspecified symptoms and signs involving the genitourinary system: Secondary | ICD-10-CM

## 2018-07-18 ENCOUNTER — Telehealth: Payer: Self-pay | Admitting: Cardiology

## 2018-07-18 DIAGNOSIS — I480 Paroxysmal atrial fibrillation: Secondary | ICD-10-CM

## 2018-07-18 DIAGNOSIS — R7989 Other specified abnormal findings of blood chemistry: Secondary | ICD-10-CM

## 2018-07-18 NOTE — Telephone Encounter (Signed)
Spoke with daughter regarding lab work needed. Patient prefers [facility] orders placed and mailed.

## 2018-07-18 NOTE — Telephone Encounter (Signed)
New Message:    Patient daughter in law would like to know where to get the lab work done. Please call patient daughter in law.

## 2018-07-19 NOTE — Telephone Encounter (Signed)
[facility] lab orders mailed to the patient.

## 2018-07-21 ENCOUNTER — Encounter: Payer: Self-pay | Admitting: Physician Assistant

## 2018-07-25 ENCOUNTER — Encounter: Payer: Self-pay | Admitting: Family Medicine

## 2018-07-25 ENCOUNTER — Encounter: Payer: Self-pay | Admitting: Physician Assistant

## 2018-07-25 ENCOUNTER — Other Ambulatory Visit: Payer: Self-pay | Admitting: Family Medicine

## 2018-07-26 MED ORDER — DULOXETINE HCL 20 MG PO CPEP: 20.0000 mg | ORAL_CAPSULE | Freq: Every day | ORAL | 1 refills | Status: DC

## 2018-07-28 NOTE — Telephone Encounter (Signed)
lmtcb

## 2018-07-31 ENCOUNTER — Institutional Professional Consult (permissible substitution): Payer: Medicare Other | Admitting: Cardiology

## 2018-08-06 ENCOUNTER — Encounter: Payer: Self-pay | Admitting: Physician Assistant

## 2018-08-07 ENCOUNTER — Encounter: Payer: Self-pay | Admitting: Physician Assistant

## 2018-08-07 ENCOUNTER — Telehealth: Payer: Self-pay | Admitting: Cardiology

## 2018-08-07 NOTE — Telephone Encounter (Signed)
Pt would like to know if we are receiving transmissions from his device. Pls advise [phone].

## 2018-08-08 ENCOUNTER — Ambulatory Visit (INDEPENDENT_AMBULATORY_CARE_PROVIDER_SITE_OTHER): Payer: Medicare Other | Admitting: Osteopathic Medicine

## 2018-08-08 ENCOUNTER — Encounter: Payer: Self-pay | Admitting: Osteopathic Medicine

## 2018-08-08 VITALS — BP 137/56 | HR 72 | Temp 97.7°F | Wt 148.3 lb

## 2018-08-08 DIAGNOSIS — B354 Tinea corporis: Secondary | ICD-10-CM | POA: Diagnosis not present

## 2018-08-08 DIAGNOSIS — R748 Abnormal levels of other serum enzymes: Secondary | ICD-10-CM | POA: Diagnosis not present

## 2018-08-08 DIAGNOSIS — E871 Hypo-osmolality and hyponatremia: Secondary | ICD-10-CM | POA: Diagnosis not present

## 2018-08-08 DIAGNOSIS — R1013 Epigastric pain: Secondary | ICD-10-CM

## 2018-08-08 MED ORDER — CLOTRIMAZOLE-BETAMETHASONE 1-0.05 % EX CREA: 1.0000 "application " | TOPICAL_CREAM | Freq: Two times a day (BID) | CUTANEOUS | 0 refills | Status: DC

## 2018-08-08 NOTE — Progress Notes (Addendum)
HPI: Andrew IllJames M Rands Jr. is a 85 y.o. male who  has a past medical history of Actinic keratoses, Aortic stenosis, Atrial fibrillation (HCC), CVA (cerebral vascular accident) (HCC), High cholesterol, History of colon cancer, History of CVA (cerebrovascular accident), Hypertension, Lower urinary tract symptoms (LUTS), and Pancreatitis, recurrent.  he presents to [facility] today, 08/08/18,  for chief complaint of: Acute visit - GI problem, rash  GI . Context: Son states Andrew Soto has had several bouts of pancreatitis, worried about this and wants to "get ahead of it" if anything is developing. Patient has had 2 recent episodes abdominal pain in the past couple days.  . Location/Quality: feels like a knot in epigastrium, feels like it spreads form there to entire abdomen.  . Severity:/Duration: last night but essentially resolved now  . Assoc signs/symptoms: opioid-induced constipation, previously, going better w/ dulcolax, Vinetta BergamoCharley started him on Movantik last visit 07/12/2018 a month ago and transitioned to Tramadol rather than opiate, never needed Movantik  Rash  L penile shaft, itching, worse when clothes rub on it. He is incontinent chronically.   Patient is accompanied by son who assists with history-taking.      Past medical, surgical, social and family history reviewed and updated as necessary.   Current medication list and allergy/intolerance information reviewed:    Current Outpatient Medications  Medication Sig Dispense Refill  . AMBULATORY NON FORMULARY MEDICATION Wheelchair simple. Use as needed for mobility.  Disp 1 R26.9 1 each 0  . amiodarone (PACERONE) 200 MG tablet Take 1 tablet (200 mg total) by mouth daily. 90 tablet 1  . aspirin 81 MG tablet Take 81 mg by mouth daily.     . Cholecalciferol (VITAMIN D3 PO) Take 1 tablet by mouth daily.    . cyanocobalamin ([facility] VITAMIN B12) 1000 MCG tablet Take 1,000 mcg by mouth daily.     .  diclofenac sodium (VOLTAREN) 1 % GEL Apply 4 g topically 4 (four) times daily. To affected joint. 400 g 11  . DULoxetine (CYMBALTA) 20 MG capsule Take 1 capsule (20 mg total) by mouth daily. 90 capsule 1  . ezetimibe (ZETIA) 10 MG tablet Take 1 tablet (10 mg total) by mouth daily. 90 tablet 1  . naloxegol oxalate (MOVANTIK) 25 MG TABS tablet Take 1 tablet (25 mg total) by mouth daily after breakfast. Take 1h before or 2h after meal 30 tablet 3  . tamsulosin (FLOMAX) 0.4 MG CAPS capsule TAKE 1 CAPSULE BY MOUTH EVERY DAY 90 capsule 1  . valsartan (DIOVAN) 40 MG tablet Take 1 tablet (40 mg total) by mouth every morning. 90 tablet 0   No current facility-administered medications for this visit.     Allergies  Allergen Reactions  . Epinephrine Other (See Comments)    Dizziness/double vision  . Penicillins Rash  . Vicodin [Hydrocodone-Acetaminophen] Nausea Only    Mood changes, Fatigue      Review of Systems:  Constitutional:  No  fever, no chills, No recent illness, No unintentional weight changes. No significant fatigue.   HEENT: No  headache, no vision change, no hearing change, No sore throat, No  sinus pressure  Cardiac: No  chest pain, No  pressure, No palpitations  Respiratory:  No  shortness of breath. No  Cough  Gastrointestinal: +abdominal pain, No  nausea, No  vomiting,  No  blood in stool, No  diarrhea  Musculoskeletal: No new myalgia/arthralgia  Skin: +Rash  Neurologic: No  weakness, No  dizziness  Exam:  BP (!) 137/56 (BP Location: Left Arm, Patient Position: Sitting, Cuff Size: Normal)   Pulse 72   Temp 97.7 F (36.5 C) (Oral)   Wt 148 lb 4.8 oz (67.3 kg)   BMI 23.23 kg/m   Constitutional: VS see above. General Appearance: alert, well-developed, well-nourished, NAD  Eyes: Normal lids and conjunctive, non-icteric sclera  Ears, Nose, Mouth, Throat: MMM, Normal external inspection ears/nares/mouth/lips/gums.   Neck: No masses, trachea midline.    Respiratory: Normal respiratory effort. no wheeze, no rhonchi, no rales  Cardiovascular: S1/S2 normal, no murmur, no rub/gallop auscultated. RRR. No lower extremity edema.   Gastrointestinal: Nontender, no masses. Bowel sounds normal.  Musculoskeletal: Gait normal.   Neurological: Normal balance/coordination. No tremor.   Skin: warm, dry, intact. Mildly erythematous, scaling lesion on L penile shaft c/w tinea   Psychiatric: Normal judgment/insight. Normal mood and affect.      ASSESSMENT/PLAN: The primary encounter diagnosis was Epigastric abdominal pain. A diagnosis of Tinea corporis was also pertinent to this visit.   Labs now, symptoms mild and seem to have resolved w/ mild residual pain. Will get labs and printed info for diet if needed.   Combo steroid for itching/irritation and antifungal for rash   Meds ordered this encounter  Medications  . clotrimazole-betamethasone (LOTRISONE) cream    Sig: Apply 1 application topically 2 (two) times daily. To affected skin until rash resolves then continue for 5-7 days after that    Dispense:  45 g    Refill:  0    Orders Placed This Encounter  Procedures  . CBC with Differential/Platelet  . COMPLETE METABOLIC PANEL WITH GFR  . Lipase  . Amylase     ADDENDUM 08/09/18 7:44 AM   Results for orders placed or performed in visit on 08/08/18 (from the past 24 hour(s))  CBC with Differential/Platelet     Status: Abnormal   Collection Time: 08/08/18  9:54 AM  Result Value Ref Range   WBC 8.6 3.8 - 10.8 Thousand/uL   RBC 4.38 4.20 - 5.80 Million/uL   Hemoglobin 14.5 13.2 - 17.1 g/dL   HCT 32.4 40.1 - 02.7 %   MCV 96.6 80.0 - 100.0 fL   MCH 33.1 (H) 27.0 - 33.0 pg   MCHC 34.3 32.0 - 36.0 g/dL   RDW 25.3 66.4 - 40.3 %   Platelets 166 140 - 400 Thousand/uL   MPV 10.8 7.5 - 12.5 fL   Neutro Abs 6,639 1,500 - 7,800 cells/uL   Lymphs Abs 912 850 - 3,900 cells/uL   Absolute Monocytes 920 200 - 950 cells/uL   Eosinophils  Absolute 86 15 - 500 cells/uL   Basophils Absolute 43 0 - 200 cells/uL   Neutrophils Relative % 77.2 %   Total Lymphocyte 10.6 %   Monocytes Relative 10.7 %   Eosinophils Relative 1.0 %   Basophils Relative 0.5 %   Smear Review    COMPLETE METABOLIC PANEL WITH GFR     Status: Abnormal   Collection Time: 08/08/18  9:54 AM  Result Value Ref Range   Glucose, Bld 88 65 - 99 mg/dL   BUN 19 7 - 25 mg/dL   Creat 4.74 2.59 - 5.63 mg/dL   GFR, Est Non African American 65 > OR = 60 mL/min/1.70m2   GFR, Est African American 75 > OR = 60 mL/min/1.26m2   BUN/Creatinine Ratio NOT APPLICABLE 6 - 22 (calc)   Sodium 132 (L) 135 - 146 mmol/L   Potassium 4.7 3.5 -  5.3 mmol/L   Chloride 98 98 - 110 mmol/L   CO2 26 20 - 32 mmol/L   Calcium 9.3 8.6 - 10.3 mg/dL   Total Protein 6.2 6.1 - 8.1 g/dL   Albumin 4.1 3.6 - 5.1 g/dL   Globulin 2.1 1.9 - 3.7 g/dL (calc)   AG Ratio 2.0 1.0 - 2.5 (calc)   Total Bilirubin 0.7 0.2 - 1.2 mg/dL   Alkaline phosphatase (APISO) 89 35 - 144 U/L   AST 21 10 - 35 U/L   ALT 22 9 - 46 U/L  Lipase     Status: Abnormal   Collection Time: 08/08/18  9:54 AM  Result Value Ref Range   Lipase 275 (H) 7 - 60 U/L  Amylase     Status: None   Collection Time: 08/08/18  9:54 AM  Result Value Ref Range   Amylase 93 21 - 101 U/L   Recheck amylase and Na+ next week          Visit summary with medication list and pertinent instructions was printed for patient to review. All questions at time of visit were answered - patient instructed to contact office with any additional concerns or updates. ER/RTC precautions were reviewed with the patient.   Follow-up plan: Return if symptoms worsen or fail to improve.    Please note: voice recognition software was used to produce this document, and typos may escape review. Please contact Dr. Lyn Hollingshead for any needed clarifications.

## 2018-08-08 NOTE — Patient Instructions (Signed)
Clear Liquid Diet, Adult A clear liquid diet is a diet that includes only liquids and semi-liquids that you can see through. You do not eat any food on this diet. Most people need to follow this diet for only a short period of time. You may need to follow a clear liquid diet if:  You develop a medical condition right before or after you have surgery.  You were not able to eat food for a long period of time.  You had a condition that gave you nausea, vomiting, or diarrhea.  You are going to have a procedure or exam to look at parts of your digestive system.  You are going to have bowel surgery. The usual goals of this diet are:  To rest the stomach and digestive system as much as possible.  To help you clear the digestive system before a procedure or exam.  To keep you hydrated.  To make sure you get some calories for energy.  To help you return to your normal eating routine. What are tips for following this plan?  A clear liquid is a liquid or semi-liquid, such as gelatin, that you can see through when you hold it up to a light.  A clear liquid diet does not provide all the nutrients that you need. It is important to choose a variety of the liquids or semi-liquids that are allowed on this diet. That way, you will get as many nutrients as possible.  If you are not sure whether you can have certain items, ask your health care provider. If you have difficulty swallowing thin liquids, you will need to thicken them to prevent breathing in (aspiration). What foods should I eat?   Water and flavored water.  Fruit juices that do not have pulp, such as cranberry juice and apple juice.  Tea and coffee without milk or cream.  Clear bouillon or broth.  Broth-based soups that have been strained.  Flavored gelatin.  Honey.  Sugar water.  Ice or frozen ice pops that do not contain milk, yogurt, fruit pieces, or fruit pulp.  Clear sodas.  Clear sports drinks. The items listed  above may not be a complete list of recommended foods and beverages. Contact a dietitian for more information. What food should I avoid?  Juices that have pulp.  Milk.  Cream or cream-based soups.  Yogurt.  Regular foods that are not clear liquids or semi-liquids. The items listed above may not be a complete list of foods and beverages to avoid. Contact a dietitian for more information. Questions to ask your health care provider:  How long do I need to follow this diet?  Are there any medicines that I should change while on this diet? Summary  A clear liquid diet is a diet that includes liquids and semi-liquids that you can see through.  The goal of this diet is to help you recover by resting your digestive system, keeping you hydrated, and providing nutrients.  Avoid liquids with milk, cream, or pulp while on this diet. This information is not intended to replace advice given to you by your health care provider. Make sure you discuss any questions you have with your health care provider. Document Released: 05/24/2005 Document Revised: 11/14/2017 Document Reviewed: 11/14/2017 Elsevier Interactive Patient Education  2019 Elsevier Inc.     Full Liquid Diet A full liquid diet refers to fluids and foods that are liquid or will become liquid at room temperature. This diet should only be used for a  short period of time to help you recover from illness or surgery. Your health care provider or dietitian will help you determine when it is safe to eat regular foods. What are tips for following this plan?     Reading food labels  Check food labels of nutrition shakes for the amount of protein. Look for nutrition shakes that have at least 8-10 grams of protein in each serving.  Look for drinks, such as milks and juices, that are "fortified" or "enriched." This means that vitamins and minerals have been added. Shopping  Buy premade nutritional shakes to keep on hand.  To vary your  choices, buy different flavors of milks and shakes. Meal planning  Choose flavors and foods that you enjoy.  To make sure you get enough energy from food (calories): ? Eat 3 full liquid meals each day. Have a liquid snack between each meal. ? Drink 6-8 ounces (177-237 ml) of a nutrition supplement shake with meals or as snacks. ? Add protein powder, powdered milk, milk, or yogurt to shakes to increase the amount of protein.  Drink at least one serving a day of citrus fruit juice or fruit juice that has vitamin C added. General guidelines  Before starting the full-liquid diet, check with your health care provider to know what foods you should avoid. These may include full-fat or high-fiber liquids.  You may have any liquid or food that becomes a liquid at room temperature. The food is considered a liquid if it can be poured off a spoon at room temperature.  Do not drink alcohol unless approved by your health care provider.  This diet gives you most of the nutrients that you need for energy, but you may not get enough of certain vitamins, minerals, and fiber. Make sure to talk to your health care provider or dietitian about: ? How many calories you need to eat get day. ? How much fluid you should have each day. ? Taking a multivitamin or a nutritional supplement. What foods are allowed? The items listed may not be a complete list. Talk with your dietitian about what dietary choices are best for you. Grains Thin hot cereal, such as cream of wheat. Soft-cooked pasta or rice pured in soup. Vegetables Pulp-free tomato or vegetable juice. Vegetables pured in soup. Fruits Fruit juice without pulp. Strained fruit pures (seeds and skins removed). Meats and other protein foods Beef, chicken, and fish broths. Powdered protein supplements. Dairy Milk and milk-based beverages, including milk shakes and instant breakfast mixes. Smooth yogurt. Pured cottage cheese. Beverages Water. Coffee and  tea (caffeinated or decaffeinated). Cocoa. Liquid nutritional supplements. Soft drinks. Nondairy milks, such as almond, coconut, rice, or soy milk. Fats and oils Melted margarine and butter. Cream. Canola, almond, avocado, corn, grapeseed, sunflower, and sesame oils. Gravy. Sweets and desserts Custard. Pudding. Flavored gelatin. Smooth ice cream (without nuts or candy pieces). Sherbet. Popsicles. [place] ice. Pudding pops. Seasoning and other foods Salt and pepper. Spices. Cocoa powder. Vinegar. Ketchup. Yellow mustard. Smooth sauces, such as [place], cheese sauce, or white sauce. Soy sauce. Cream soups. Strained soups. Syrup. Honey. Jelly (without fruit pieces). What foods are not allowed? The items listed may not be a complete list. Talk with your dietitian about what dietary choices are best for you. Grains Whole grains. Pasta. Rice. Cold cereal. Bread. Crackers. Vegetables All whole fresh, frozen, or canned vegetables. Fruits All whole fresh, frozen, or canned fruits. Meats and other protein foods All cuts of meat, poultry, and fish. Eggs.  Tofu and soy protein. Nuts and nut butters. Lunch meat. Sausage. Dairy Hard cheese. Yogurt with fruit chunks. Fats and oils Coconut oil. Palm oil. Lard. Cold butter. Sweets and desserts Ice cream or other frozen desserts that have any solids in them or on top, such as nuts, chocolate chips, and pieces of cookies. Cakes. Cookies. Candy. Seasoning and other foods Stone-ground mustards. Soups with chunks or pieces. Summary  A full liquid diet refers to fluids and foods that are liquid or will become liquid at room temperature.  This diet should only be used for a short period of time to help you recover from illness or surgery. Ask your health care provider or dietitian when it is safe for you to eat regular foods.  To make sure you get enough calories and nutrients, eat 3 meals each day with snacks between. Drink premade nutrition supplement  shakes or add protein powder to homemade shakes. Take a vitamin and mineral supplement as told by your health care provider. This information is not intended to replace advice given to you by your health care provider. Make sure you discuss any questions you have with your health care provider. Document Released: 05/24/2005 Document Revised: 07/07/2016 Document Reviewed: 07/07/2016 Elsevier Interactive Patient Education  2019 [employer].  Parke Simmers Diet A bland diet consists of foods that are often soft and do not have a lot of fat, fiber, or extra seasonings. Foods without fat, fiber, or seasoning are easier for the body to digest. They are also less likely to irritate your mouth, throat, stomach, and other parts of your digestive system. A bland diet is sometimes called a BRAT diet. What is my plan? Your health care provider or food and nutrition specialist (dietitian) may recommend specific changes to your diet to prevent symptoms or to treat your symptoms. These changes may include:  Eating small meals often.  Cooking food until it is soft enough to chew easily.  Chewing your food well.  Drinking fluids slowly.  Not eating foods that are very spicy, sour, or fatty.  Not eating citrus fruits, such as oranges and grapefruit. What do I need to know about this diet?  Eat a variety of foods from the bland diet food list.  Do not follow a bland diet longer than needed.  Ask your health care provider whether you should take vitamins or supplements. What foods can I eat? Grains  Hot cereals, such as cream of wheat. Rice. Bread, crackers, or tortillas made from refined white flour. Vegetables Canned or cooked vegetables. Mashed or boiled potatoes. Fruits  Bananas. Applesauce. Other types of cooked or canned fruit with the skin and seeds removed, such as canned peaches or pears. Meats and other proteins  Scrambled eggs. Creamy peanut butter or other nut butters. Lean, well-cooked  meats, such as chicken or fish. Tofu. Soups or broths. Dairy Low-fat dairy products, such as milk, cottage cheese, or yogurt. Beverages  Water. Herbal tea. Apple juice. Fats and oils Mild salad dressings. Canola or olive oil. Sweets and desserts Pudding. Custard. Fruit gelatin. Ice cream. The items listed above may not be a complete list of recommended foods and beverages. Contact a dietitian for more options. What foods are not recommended? Grains Whole grain breads and cereals. Vegetables Raw vegetables. Fruits Raw fruits, especially citrus, berries, or dried fruits. Dairy Whole fat dairy foods. Beverages Caffeinated drinks. Alcohol. Seasonings and condiments Strongly flavored seasonings or condiments. Hot sauce. Salsa. Other foods Spicy foods. Fried foods. Sour foods, such as  pickled or fermented foods. Foods with high sugar content. Foods high in fiber. The items listed above may not be a complete list of foods and beverages to avoid. Contact a dietitian for more information. Summary  A bland diet consists of foods that are often soft and do not have a lot of fat, fiber, or extra seasonings.  Foods without fat, fiber, or seasoning are easier for the body to digest.  Check with your health care provider to see how long you should follow this diet plan. It is not meant to be followed for long periods. This information is not intended to replace advice given to you by your health care provider. Make sure you discuss any questions you have with your health care provider. Document Released: 09/15/2015 Document Revised: 06/22/2017 Document Reviewed: 06/22/2017 Elsevier Interactive Patient Education  2019 [employer].

## 2018-08-08 NOTE — Telephone Encounter (Signed)
I explained to the patient that the [facility] in [place] still have not released him in [place].   I did call again this morning for them to release him in [place] so we can follow the patient. I told him I can help him send a manual transmission however we will not be able to see the transmission until the other clinic releases him in [place].

## 2018-08-09 ENCOUNTER — Telehealth: Payer: Self-pay | Admitting: Cardiology

## 2018-08-09 ENCOUNTER — Encounter: Payer: Self-pay | Admitting: Osteopathic Medicine

## 2018-08-09 LAB — COMPLETE METABOLIC PANEL WITH GFR
AG Ratio: 2 (calc) (ref 1.0–2.5)
ALT: 22 U/L (ref 9–46)
AST: 21 U/L (ref 10–35)
Albumin: 4.1 g/dL (ref 3.6–5.1)
Alkaline phosphatase (APISO): 89 U/L (ref 35–144)
BUN: 19 mg/dL (ref 7–25)
CO2: 26 mmol/L (ref 20–32)
Calcium: 9.3 mg/dL (ref 8.6–10.3)
Chloride: 98 mmol/L (ref 98–110)
Creat: 1.05 mg/dL (ref 0.70–1.11)
GFR, Est African American: 75 mL/min/{1.73_m2} (ref >=60)
GFR, Est Non African American: 65 mL/min/{1.73_m2} (ref >=60)
Globulin: 2.1 g/dL (calc) (ref 1.9–3.7)
Glucose, Bld: 88 mg/dL (ref 65–99)
Potassium: 4.7 mmol/L (ref 3.5–5.3)
Sodium: 132 mmol/L — ABNORMAL LOW (ref 135–146)
Total Bilirubin: 0.7 mg/dL (ref 0.2–1.2)
Total Protein: 6.2 g/dL (ref 6.1–8.1)

## 2018-08-09 LAB — CBC WITH DIFFERENTIAL/PLATELET
Absolute Monocytes: 920 cells/uL (ref 200–950)
Basophils Absolute: 43 cells/uL (ref 0–200)
Basophils Relative: 0.5 %
Eosinophils Absolute: 86 cells/uL (ref 15–500)
Eosinophils Relative: 1 %
HEMATOCRIT: 42.3 % (ref 38.5–50.0)
Hemoglobin: 14.5 g/dL (ref 13.2–17.1)
Lymphs Abs: 912 cells/uL (ref 850–3900)
MCH: 33.1 pg — ABNORMAL HIGH (ref 27.0–33.0)
MCHC: 34.3 g/dL (ref 32.0–36.0)
MCV: 96.6 fL (ref 80.0–100.0)
MPV: 10.8 fL (ref 7.5–12.5)
Monocytes Relative: 10.7 %
Neutro Abs: 6639 cells/uL (ref 1500–7800)
Neutrophils Relative %: 77.2 %
Platelets: 166 10*3/uL (ref 140–400)
RBC: 4.38 10*6/uL (ref 4.20–5.80)
RDW: 14.4 % (ref 11.0–15.0)
Total Lymphocyte: 10.6 %
WBC: 8.6 10*3/uL (ref 3.8–10.8)

## 2018-08-09 LAB — LIPASE: Lipase: 275 U/L — ABNORMAL HIGH (ref 7–60)

## 2018-08-09 LAB — AMYLASE: Amylase: 93 U/L (ref 21–101)

## 2018-08-09 NOTE — Addendum Note (Signed)
Addended by: Deirdre Pippins on: 08/09/2018 07:45 AM   Modules accepted: Orders

## 2018-08-09 NOTE — Telephone Encounter (Signed)
I told the patient that I finally got in touch with the clinic in [place] and got him released in [place]. I was able to get him enrolled in [place] with our clinic. I told him he can give me a call when he needs help sending a manual transmission.

## 2018-08-09 NOTE — Telephone Encounter (Signed)
  Patient needs lab order sent for [facility] not [facility]

## 2018-08-09 NOTE — Telephone Encounter (Signed)
Spoke with pt, [facility] lab orders mailed to the patient

## 2018-08-10 ENCOUNTER — Encounter: Payer: Self-pay | Admitting: Osteopathic Medicine

## 2018-08-10 DIAGNOSIS — M48061 Spinal stenosis, lumbar region without neurogenic claudication: Secondary | ICD-10-CM

## 2018-08-10 MED ORDER — TRAMADOL HCL 50 MG PO TABS: 50.0000 mg | ORAL_TABLET | Freq: Four times a day (QID) | ORAL | 0 refills | Status: DC | PRN

## 2018-08-11 ENCOUNTER — Telehealth: Payer: Self-pay | Admitting: *Deleted

## 2018-08-11 ENCOUNTER — Telehealth: Payer: Self-pay

## 2018-08-11 NOTE — Telephone Encounter (Signed)
Pt needed help with a manual transmission. I told him if the nurse see anything she will give him a call. I told if she did not call him everything is normal.

## 2018-08-11 NOTE — Telephone Encounter (Signed)
-----   Message from Alvy Beal sent at 08/11/2018  4:00 PM EST ----- Regarding: patient call Patient would like a phone call, he did not want to discuss with me what about.

## 2018-08-11 NOTE — Telephone Encounter (Signed)
Transmission received. Normal device function.  

## 2018-08-11 NOTE — Telephone Encounter (Signed)
Spoke to pt.  Transferred call to device clinic who is going to have pt send a transmission today. Pt agreeable to plan.

## 2018-08-14 ENCOUNTER — Ambulatory Visit (INDEPENDENT_AMBULATORY_CARE_PROVIDER_SITE_OTHER): Payer: Medicare Other | Admitting: Family Medicine

## 2018-08-14 ENCOUNTER — Other Ambulatory Visit: Payer: Self-pay | Admitting: Family Medicine

## 2018-08-14 ENCOUNTER — Encounter: Payer: Self-pay | Admitting: Family Medicine

## 2018-08-14 VITALS — BP 196/64 | HR 59 | Wt 142.0 lb

## 2018-08-14 DIAGNOSIS — I480 Paroxysmal atrial fibrillation: Secondary | ICD-10-CM | POA: Diagnosis not present

## 2018-08-14 DIAGNOSIS — I10 Essential (primary) hypertension: Secondary | ICD-10-CM

## 2018-08-14 DIAGNOSIS — R7989 Other specified abnormal findings of blood chemistry: Secondary | ICD-10-CM

## 2018-08-14 DIAGNOSIS — K859 Acute pancreatitis without necrosis or infection, unspecified: Secondary | ICD-10-CM | POA: Diagnosis not present

## 2018-08-14 DIAGNOSIS — R42 Dizziness and giddiness: Secondary | ICD-10-CM

## 2018-08-14 MED ORDER — DULOXETINE HCL 40 MG PO CPEP: 40.0000 mg | ORAL_CAPSULE | Freq: Every day | ORAL | 1 refills | Status: DC

## 2018-08-14 NOTE — Patient Instructions (Addendum)
Thank you for coming in today.  Increase cymbalta to 40 mg Go back to 20mg  if you have trouble.   Recheck with Charley in 3 weeks for blood pressure and light headed.   Keep track of blood pressure.   Get labs now.

## 2018-08-14 NOTE — Progress Notes (Signed)
Andrew Soto. is a 85 y.o. male who presents to [facility]: Primary Care Sports Medicine today for fatigue and lightheadedness.  Symptoms present over the last several days.  Patient also notes a little bit of ankle swelling bilaterally.  He was seen last week for abdominal pain and found to have minimally elevated lipase consistent with previous episodes of pancreatitis.  He was advised to switch to a clear liquid diet over the last week which he has.  He notes he is a little bit more unstable and a little more lightheaded than usual.  He has had some falls and some near misses recently as well. He denies chest pain palpitations or shortness of breath.    Additionally he notes continued anxiety and depressive symptoms.  He takes Cymbalta 20 mg daily.  This is been present for about a month and a half now.  He notes it is been helpful but not fully.  He continues to express depressive symptoms. ROS as above:  Exam:  BP (!) 196/64   Pulse (!) 59   Wt 142 lb (64.4 kg)   BMI 22.24 kg/m  Wt Readings from Last 5 Encounters:  08/14/18 142 lb (64.4 kg)  08/08/18 148 lb 4.8 oz (67.3 kg)  07/10/18 151 lb (68.5 kg)  07/03/18 152 lb (68.9 kg)  06/30/18 152 lb (68.9 kg)    Gen: Well NAD HEENT: EOMI,  MMM Lungs: Normal work of breathing. CTABL Heart: RRR no MRG Abd: NABS, Soft. Nondistended, Nontender Exts: Brisk capillary refill, warm and well perfused.  No significant edema bilateral lower extremities.  Intact pulses bilateral lower extremities.  Psych alert and oriented normal speech thought process and affect.  No SI or HI.  Depression screen [facility] 2/9 08/14/2018 04/13/2018  Decreased Interest 3 0  Down, Depressed, Hopeless 1 1  PHQ - 2 Score 4 1  Altered sleeping 0 0  Tired, decreased energy 3 0  Change in appetite 3 0  Feeling bad or failure about yourself  3 1  Trouble concentrating 0 0    Moving slowly or fidgety/restless 2 0  Suicidal thoughts 3 1  PHQ-9 Score 18 3  Difficult doing work/chores Somewhat difficult -    Lab and Radiology Results No results found for this or any previous visit (from the past 72 hour(s)). No results found.    Assessment and Plan: 85 y.o. male with  Lightheadedness and instability: Concerning for dehydration.  Patient has been on a clear liquid diet recently due to pancreatitis.  He is also down a few pounds and I suspect he is dehydrated which explains his symptoms.  We will recheck labs including pancreas labs and resume normal diet.  I am optimistic this will improve his symptoms.  Blood pressure is however elevated.  Plan to recheck in the near future.  Pancreatitis: Reassess lipase.  Asymptomatic currently.  Depression: Improved not fully however.  Increase Cymbalta and recheck with PCP in the near future.   PDMP not reviewed this encounter. Orders Placed This Encounter  Procedures  . CBC with Differential/Platelet  . COMPLETE METABOLIC PANEL WITH GFR  . Lipase  . TSH  . T4, free   Meds ordered this encounter  Medications  . DULoxetine HCl 40 MG CPEP    Sig: Take 40 mg by mouth daily.    Dispense:  90 capsule    Refill:  1     Historical information moved to improve visibility  of documentation.  Past Medical History:  Diagnosis Date  . Actinic keratoses   . Aortic stenosis   . Atrial fibrillation (HCC)   . CVA (cerebral vascular accident) (HCC)   . High cholesterol   . History of colon cancer   . History of CVA (cerebrovascular accident)   . Hypertension   . Lower urinary tract symptoms (LUTS)   . Pancreatitis, recurrent    Past Surgical History:  Procedure Laterality Date  . ABDOMINAL SURGERY    . CHOLECYSTECTOMY    . COLON RESECTION    . MOHS SURGERY     Right eyebrow  . PACEMAKER IMPLANT    . VALVE REPLACEMENT     Social History   Tobacco Use  . Smoking status: Former Smoker    Packs/day: 1.00     Years: 5.00    Pack years: 5.00    Types: Cigarettes  . Smokeless tobacco: Never Used  Substance Use Topics  . Alcohol use: Yes    Comment: glass wine daily   family history includes Testicular cancer in his father.  Medications: Current Outpatient Medications  Medication Sig Dispense Refill  . AMBULATORY NON FORMULARY MEDICATION Wheelchair simple. Use as needed for mobility.  Disp 1 R26.9 1 each 0  . amiodarone (PACERONE) 200 MG tablet Take 1 tablet (200 mg total) by mouth daily. 90 tablet 1  . aspirin 81 MG tablet Take 81 mg by mouth daily.     . Cholecalciferol (VITAMIN D3 PO) Take 1 tablet by mouth daily.    . clotrimazole-betamethasone (LOTRISONE) cream Apply 1 application topically 2 (two) times daily. To affected skin until rash resolves then continue for 5-7 days after that 45 g 0  . cyanocobalamin ([employer] VITAMIN B12) 1000 MCG tablet Take 1,000 mcg by mouth daily.     . diclofenac sodium (VOLTAREN) 1 % GEL Apply 4 g topically 4 (four) times daily. To affected joint. 400 g 11  . DULoxetine HCl 40 MG CPEP Take 40 mg by mouth daily. 90 capsule 1  . ezetimibe (ZETIA) 10 MG tablet Take 1 tablet (10 mg total) by mouth daily. 90 tablet 1  . naloxegol oxalate (MOVANTIK) 25 MG TABS tablet Take 1 tablet (25 mg total) by mouth daily after breakfast. Take 1h before or 2h after meal 30 tablet 3  . tamsulosin (FLOMAX) 0.4 MG CAPS capsule TAKE 1 CAPSULE BY MOUTH EVERY DAY 90 capsule 1  . traMADol (ULTRAM) 50 MG tablet Take 1 tablet (50 mg total) by mouth every 6 (six) hours as needed for moderate pain or severe pain. Maximum 6 tabs per day. 60 tablet 0  . valsartan (DIOVAN) 40 MG tablet Take 1 tablet (40 mg total) by mouth every morning. 90 tablet 0   No current facility-administered medications for this visit.    Allergies  Allergen Reactions  . Epinephrine Other (See Comments)    Dizziness/double vision  . Penicillins Rash  . Vicodin [Hydrocodone-Acetaminophen] Nausea Only    Mood  changes, Fatigue     Discussed warning signs or symptoms. Please see discharge instructions. Patient expresses understanding.

## 2018-08-15 LAB — CBC WITH DIFFERENTIAL/PLATELET
Absolute Monocytes: 970 cells/uL — ABNORMAL HIGH (ref 200–950)
Basophils Absolute: 30 cells/uL (ref 0–200)
Basophils Relative: 0.3 %
Eosinophils Absolute: 30 cells/uL (ref 15–500)
Eosinophils Relative: 0.3 %
HCT: 42.4 % (ref 38.5–50.0)
Hemoglobin: 14.5 g/dL (ref 13.2–17.1)
Lymphs Abs: 1168 cells/uL (ref 850–3900)
MCH: 32.8 pg (ref 27.0–33.0)
MCHC: 34.2 g/dL (ref 32.0–36.0)
MCV: 95.9 fL (ref 80.0–100.0)
MONOS PCT: 9.8 %
MPV: 10.2 fL (ref 7.5–12.5)
Neutro Abs: 7702 cells/uL (ref 1500–7800)
Neutrophils Relative %: 77.8 %
PLATELETS: 220 10*3/uL (ref 140–400)
RBC: 4.42 10*6/uL (ref 4.20–5.80)
RDW: 14.3 % (ref 11.0–15.0)
Total Lymphocyte: 11.8 %
WBC: 9.9 10*3/uL (ref 3.8–10.8)

## 2018-08-15 LAB — COMPLETE METABOLIC PANEL WITH GFR
AG Ratio: 1.9 (calc) (ref 1.0–2.5)
ALT: 18 U/L (ref 9–46)
AST: 18 U/L (ref 10–35)
Albumin: 4.1 g/dL (ref 3.6–5.1)
Alkaline phosphatase (APISO): 64 U/L (ref 35–144)
BUN: 13 mg/dL (ref 7–25)
CHLORIDE: 96 mmol/L — AB (ref 98–110)
CO2: 27 mmol/L (ref 20–32)
Calcium: 9.6 mg/dL (ref 8.6–10.3)
Creat: 1.01 mg/dL (ref 0.70–1.11)
GFR, Est African American: 79 mL/min/{1.73_m2} (ref >=60)
GFR, Est Non African American: 68 mL/min/{1.73_m2} (ref >=60)
GLUCOSE: 83 mg/dL (ref 65–99)
Globulin: 2.2 g/dL (calc) (ref 1.9–3.7)
Potassium: 4.3 mmol/L (ref 3.5–5.3)
Sodium: 132 mmol/L — ABNORMAL LOW (ref 135–146)
Total Bilirubin: 0.9 mg/dL (ref 0.2–1.2)
Total Protein: 6.3 g/dL (ref 6.1–8.1)

## 2018-08-15 LAB — T4, FREE: Free T4: 1 ng/dL (ref 0.8–1.8)

## 2018-08-15 LAB — TSH: TSH: 4.32 m[IU]/L (ref 0.40–4.50)

## 2018-08-15 LAB — LIPASE: Lipase: 26 U/L (ref 7–60)

## 2018-08-15 MED ORDER — DULOXETINE HCL 20 MG PO CPEP: 40.0000 mg | ORAL_CAPSULE | Freq: Every day | ORAL | 3 refills | Status: DC

## 2018-08-16 ENCOUNTER — Encounter: Payer: Self-pay | Admitting: Family Medicine

## 2018-08-21 ENCOUNTER — Telehealth: Payer: Self-pay | Admitting: Physician Assistant

## 2018-08-21 ENCOUNTER — Encounter: Payer: Self-pay | Admitting: Family Medicine

## 2018-08-21 NOTE — Telephone Encounter (Signed)
PT is concerned blood work is taking long to process through [facility].  Done on 08/14/18  Please Advise.

## 2018-08-22 MED ORDER — AMBULATORY NON FORMULARY MEDICATION: 0 refills | Status: DC

## 2018-08-24 NOTE — Telephone Encounter (Signed)
Labs received and placed in Dr. Denyse Amass box. -EH/RMA

## 2018-08-25 ENCOUNTER — Telehealth: Payer: Self-pay | Admitting: Physician Assistant

## 2018-08-25 NOTE — Telephone Encounter (Signed)
Patient calling in stating that he needs to be on the phone, [employer] (the vendor) and Dr.Corey for a mobility evaluation. Patient wanted to know what was best to do do arrange this. The vendor that has been helping him is [employer] and the number is [phone]. Please advise.

## 2018-08-31 ENCOUNTER — Encounter: Payer: Self-pay | Admitting: Physician Assistant

## 2018-08-31 NOTE — Telephone Encounter (Signed)
Left patient a voicemail with information below and to call us back with the number for [facility].

## 2018-08-31 NOTE — Telephone Encounter (Signed)
Please double check with family about the phone number. There are not enough digits on this to call Gayle back.

## 2018-08-31 NOTE — Telephone Encounter (Signed)
Pt called the office stating he needs to be seen in the office due to a recent fall and an injury that he cannot send a picture of. Pt states he would like to keep appt Monday 09/04/18 and be seen in the office. Please advise.

## 2018-08-31 NOTE — Telephone Encounter (Signed)
Ok to for in office visit.

## 2018-09-03 ENCOUNTER — Other Ambulatory Visit: Payer: Self-pay | Admitting: Physician Assistant

## 2018-09-03 DIAGNOSIS — M533 Sacrococcygeal disorders, not elsewhere classified: Secondary | ICD-10-CM

## 2018-09-04 ENCOUNTER — Ambulatory Visit (INDEPENDENT_AMBULATORY_CARE_PROVIDER_SITE_OTHER): Payer: Medicare Other | Admitting: Family Medicine

## 2018-09-04 ENCOUNTER — Encounter: Payer: Self-pay | Admitting: Family Medicine

## 2018-09-04 ENCOUNTER — Encounter: Payer: Self-pay | Admitting: Physician Assistant

## 2018-09-04 ENCOUNTER — Ambulatory Visit (INDEPENDENT_AMBULATORY_CARE_PROVIDER_SITE_OTHER): Payer: Medicare Other | Admitting: Physician Assistant

## 2018-09-04 ENCOUNTER — Other Ambulatory Visit: Payer: Self-pay

## 2018-09-04 ENCOUNTER — Ambulatory Visit (INDEPENDENT_AMBULATORY_CARE_PROVIDER_SITE_OTHER): Payer: Medicare Other

## 2018-09-04 VITALS — BP 181/77 | HR 60

## 2018-09-04 VITALS — BP 159/66 | HR 67 | Temp 98.4°F | Wt 145.0 lb

## 2018-09-04 DIAGNOSIS — R29898 Other symptoms and signs involving the musculoskeletal system: Secondary | ICD-10-CM | POA: Diagnosis not present

## 2018-09-04 DIAGNOSIS — M7061 Trochanteric bursitis, right hip: Secondary | ICD-10-CM | POA: Diagnosis not present

## 2018-09-04 DIAGNOSIS — L89312 Pressure ulcer of right buttock, stage 2: Secondary | ICD-10-CM

## 2018-09-04 DIAGNOSIS — M533 Sacrococcygeal disorders, not elsewhere classified: Secondary | ICD-10-CM

## 2018-09-04 DIAGNOSIS — I10 Essential (primary) hypertension: Secondary | ICD-10-CM

## 2018-09-04 DIAGNOSIS — Z9989 Dependence on other enabling machines and devices: Secondary | ICD-10-CM

## 2018-09-04 DIAGNOSIS — M21372 Foot drop, left foot: Secondary | ICD-10-CM

## 2018-09-04 DIAGNOSIS — R296 Repeated falls: Secondary | ICD-10-CM

## 2018-09-04 DIAGNOSIS — R1013 Epigastric pain: Secondary | ICD-10-CM

## 2018-09-04 DIAGNOSIS — M7062 Trochanteric bursitis, left hip: Secondary | ICD-10-CM

## 2018-09-04 MED ORDER — FAMOTIDINE 20 MG PO TABS: 20.0000 mg | ORAL_TABLET | Freq: Two times a day (BID) | ORAL | Status: DC | PRN

## 2018-09-04 MED ORDER — NONFORMULARY OR COMPOUNDED ITEM: 0 refills | Status: DC

## 2018-09-04 NOTE — Patient Instructions (Addendum)
Thank you for coming in today. I think a lot of the pain is due to weakness and tendonitis of the hip abductor tendons.   Continue tylenol for pain.  Use tramadol for severe pain.    Trochanteric Bursitis Rehab Ask your health care provider which exercises are safe for you. Do exercises exactly as told by your health care provider and adjust them as directed. It is normal to feel mild stretching, pulling, tightness, or discomfort as you do these exercises, but you should stop right away if you feel sudden pain or your pain gets worse.Do not begin these exercises until told by your health care provider. Stretching exercises These exercises warm up your muscles and joints and improve the movement and flexibility of your hip. These exercises also help to relieve pain and stiffness. Exercise A: Iliotibial band stretch  1. Lie on your side with your left / right leg in the top position. 2. Bend your left / right knee and grab your ankle. 3. Slowly bring your knee back so your thigh is behind your body. 4. Slowly lower your knee toward the floor until you feel a gentle stretch on the outside of your left / right thigh. If you do not feel a stretch and your knee will not fall farther, place the heel of your other foot on top of your outer knee and pull your thigh down farther. 5. Hold this position for __________ seconds. 6. Slowly return to the starting position. Repeat __________ times. Complete this exercise __________ times a day. Strengthening exercises These exercises build strength and endurance in your hip and pelvis. Endurance is the ability to use your muscles for a long time, even after they get tired. Exercise B: Bridge (hip extensors)  1. Lie on your back on a firm surface with your knees bent and your feet flat on the floor. 2. Tighten your buttocks muscles and lift your buttocks off the floor until your trunk is level with your thighs. You should feel the muscles working in your  buttocks and the back of your thighs. If this exercise is too easy, try doing it with your arms crossed over your chest. 3. Hold this position for __________ seconds. 4. Slowly return to the starting position. 5. Let your muscles relax completely between repetitions. Repeat __________ times. Complete this exercise __________ times a day. Exercise C: Squats (knee extensors and  quadriceps) 1. Stand in front of a table, with your feet and knees pointing straight ahead. You may rest your hands on the table for balance but not for support. 2. Slowly bend your knees and lower your hips like you are going to sit in a chair. ? Keep your weight over your heels, not over your toes. ? Keep your lower legs upright so they are parallel with the table legs. ? Do not let your hips go lower than your knees. ? Do not bend lower than told by your health care provider. ? If your hip pain increases, do not bend as low. 3. Hold this position for __________ seconds. 4. Slowly push with your legs to return to standing. Do not use your hands to pull yourself to standing. Repeat __________ times. Complete this exercise __________ times a day. Exercise D: Hip hike 1. Stand sideways on a bottom step. Stand on your left / right leg with your other foot unsupported next to the step. You can hold onto the railing or wall if needed for balance. 2. Keeping your knees straight and  your torso square, lift your left / right hip up toward the ceiling. 3. Hold this position for __________ seconds. 4. Slowly let your left / right hip lower toward the floor, past the starting position. Your foot should get closer to the floor. Do not lean or bend your knees. Repeat __________ times. Complete this exercise __________ times a day. Exercise E: Single leg stand 1. Stand near a counter or door frame that you can hold onto for balance as needed. It is helpful to stand in front of a mirror for this exercise so you can watch your hip. 2.  Squeeze your left / right buttock muscles then lift up your other foot. Do not let your left / right hip push out to the side. 3. Hold this position for __________ seconds. Repeat __________ times. Complete this exercise __________ times a day. This information is not intended to replace advice given to you by your health care provider. Make sure you discuss any questions you have with your health care provider. Document Released: 07/01/2004 Document Revised: 01/29/2016 Document Reviewed: 05/09/2015 Elsevier Interactive Patient Education  2019 [employer].

## 2018-09-04 NOTE — Progress Notes (Signed)
HPI:                                                                Andrew Urman. is a 85 y.o. male who presents to [facility]: Primary Care Sports Medicine today for fall/coccygeal pain  History is provided by patient and patient's Andrew.  Andrew reports that Andrew Soto is falling nearly every other day. Andrew has witnessed him losing his balance easily. He states that his dad's knees shake when he stands and he appears weak. The last fall that resulted in an injury was 2 weeks ago. Andrew states he fell backwards landing on his wheelchair and hurting his buttock and sustaining bruising and superficial skin tears of his right arm.  Andrew also states he has developed a sore on his buttock that has been present for about 2 weeks. Reports Andrew Soto sits in a chair for most of the day. Patient endorses pain with position changes and sitting, which applies direct pressure.   Andrew Soto was recently treated for mild acute pancreatitis with clear liquid diet. Lipase improved from 275 3 weeks ago to 26. Reports his abdominal pain has improved but he is still having intermittent discomfort over his epigastrum with meals. It does not occur with every meal. Pain lasts for 15-20 minutes. Denies weight loss, dysphagia, odynophagia, vomiting.  Past Medical History:  Diagnosis Date  . Actinic keratoses   . Aortic stenosis   . Atrial fibrillation (HCC)   . CVA (cerebral vascular accident) (HCC)   . High cholesterol   . History of colon cancer   . History of CVA (cerebrovascular accident)   . Hypertension   . Lower urinary tract symptoms (LUTS)   . Pancreatitis, recurrent    Past Surgical History:  Procedure Laterality Date  . ABDOMINAL SURGERY    . CHOLECYSTECTOMY    . COLON RESECTION    . MOHS SURGERY     Right eyebrow  . PACEMAKER IMPLANT    . VALVE REPLACEMENT     Social History   Tobacco Use  . Smoking status: Former Smoker    Packs/day: 1.00    Years: 5.00    Pack years: 5.00     Types: Cigarettes  . Smokeless tobacco: Never Used  Substance Use Topics  . Alcohol use: Yes    Comment: glass wine daily   family history includes Testicular cancer in his father.    ROS: negative except as noted in the HPI  Medications: Current Outpatient Medications  Medication Sig Dispense Refill  . AMBULATORY NON FORMULARY MEDICATION Motorized wheelchair. Disp 1 R26.9 1 each 0  . amiodarone (PACERONE) 200 MG tablet Take 1 tablet (200 mg total) by mouth daily. 90 tablet 1  . aspirin 81 MG tablet Take 81 mg by mouth daily.     . Cholecalciferol (VITAMIN D3 PO) Take 1 tablet by mouth daily.    . clotrimazole-betamethasone (LOTRISONE) cream Apply 1 application topically 2 (two) times daily. To affected skin until rash resolves then continue for 5-7 days after that 45 g 0  . cyanocobalamin ([employer] VITAMIN B12) 1000 MCG tablet Take 1,000 mcg by mouth daily.     . diclofenac sodium (VOLTAREN) 1 % GEL Apply 4 g topically 4 (four) times daily. To affected  joint. 400 g 11  . DULoxetine (CYMBALTA) 20 MG capsule Take 2 capsules (40 mg total) by mouth daily. 180 capsule 3  . ezetimibe (ZETIA) 10 MG tablet Take 1 tablet (10 mg total) by mouth daily. 90 tablet 1  . NONFORMULARY OR COMPOUNDED ITEM Gel donut cushion 1 each 0  . NONFORMULARY OR COMPOUNDED ITEM Gel mattress topper for pressure relief 1 each 0  . tamsulosin (FLOMAX) 0.4 MG CAPS capsule TAKE 1 CAPSULE BY MOUTH EVERY DAY 90 capsule 1  . traMADol (ULTRAM) 50 MG tablet Take 1 tablet (50 mg total) by mouth every 6 (six) hours as needed for moderate pain or severe pain. Maximum 6 tabs per day. 60 tablet 0  . valsartan (DIOVAN) 40 MG tablet Take 1 tablet (40 mg total) by mouth every morning. 90 tablet 0   No current facility-administered medications for this visit.    Allergies  Allergen Reactions  . Epinephrine Other (See Comments)    Dizziness/double vision  . Penicillins Rash  . Vicodin [Hydrocodone-Acetaminophen] Nausea Only     Mood changes, Fatigue       Objective:  BP (!) 181/77   Pulse 60   SpO2 98%  Vitals:   09/04/18 0927 09/04/18 0937  BP: (!) 193/67 (!) 181/77  Pulse: 60   SpO2: 98%     Gen:  alert, not ill-appearing, no distress, appropriate for age, seated in a wheelchair HEENT: head normocephalic without obvious abnormality, conjunctiva and cornea clear, wearing glasses, trachea midline Pulm: Normal work of breathing, normal phonation Neuro: alert and oriented x 3, no tremor MSK: extremities atraumatic, wearing an AFO on left lower extremity, lower extremity strength 4-/5 and symmetric Skin: late stage 1/early stage 2 pressure ulcer with mild skin breakdown measuring approx 3 cm of the right medial upper buttock; multiple healing ecchymoses of upper extremities Psych: well-groomed, cooperative, good eye contact, euthymic mood, affect mood-congruent, speech is articulate, and thought processes clear and goal-directed  Lab Results  Component Value Date   CREATININE 1.01 08/14/2018   BUN 13 08/14/2018   NA 132 (L) 08/14/2018   K 4.3 08/14/2018   CL 96 (L) 08/14/2018   CO2 27 08/14/2018   Lab Results  Component Value Date   ALT 18 08/14/2018   AST 18 08/14/2018   ALKPHOS 64 03/28/2018   BILITOT 0.9 08/14/2018   Lab Results  Component Value Date   LIPASE 26 08/14/2018    Lab Results  Component Value Date   WBC 9.9 08/14/2018   HGB 14.5 08/14/2018   HCT 42.4 08/14/2018   MCV 95.9 08/14/2018   PLT 220 08/14/2018     Wt Readings from Last 3 Encounters:  08/14/18 142 lb (64.4 kg)  08/08/18 148 lb 4.8 oz (67.3 kg)  07/10/18 151 lb (68.5 kg)     No results found for this or any previous visit (from the past 72 hour(s)). Dg Sacrum/coccyx  Result Date: 09/04/2018 CLINICAL DATA:  85 year old male with fall and lower back pain EXAM: SACRUM AND COCCYX - 2+ VIEW COMPARISON:  None. FINDINGS: Osteopenia. No acute displaced fracture of the L4 or L5 vertebral body. No acute displaced  fracture of the sacrum or coccyx. Degenerative changes of the sacrum and coccyx. No acute angulated cortex. Vascular calcifications with evidence of prior bilateral iliac artery stenting. Surgical changes of the anatomic pelvis. Lower lumbar degenerative changes, similar to prior CT. IMPRESSION: Negative for acute displaced fracture. Electronically Signed   By: Gilmer Mor D.O.  On: 09/04/2018 09:27      Assessment and Plan: 85 y.o. male with   .Tarell was seen today for fall.  Diagnoses and all orders for this visit:  Pressure ulcer of right buttock, stage 2 (HCC) -     Ambulatory referral to Home Health -     NONFORMULARY OR COMPOUNDED ITEM; Gel donut cushion -     NONFORMULARY OR COMPOUNDED ITEM; Gel mattress topper for pressure relief  Multiple falls -     Ambulatory referral to Home Health  Weakness of both lower extremities -     Ambulatory referral to Home Health  Acquired left foot drop -     Ambulatory referral to Home Health  Uses walker -     Ambulatory referral to Home Health  Dyspepsia -     famotidine (PEPCID) 20 MG tablet; Take 1 tablet (20 mg total) by mouth 2 (two) times daily as needed for heartburn or indigestion.   Dyspepsia 9 pound weight loss over the last month. He was on a clear liquid diet for acute pancreatitis Unremarkable CBC and CMP 3 weeks ago Will monitor weight closely  Start Pepcid before largest meal of the day and up to twice a day as needed  Weakness of both lower extremities, Multiple falls Left sided weakness is somewhat residual from prior CVA but right side is predominantly due to lumbar radiculopathy and deconditioning Referral placed to home health PT for strengthening and fall prevention  Pressure ulcer of right buttock Rx for donut cushion and mattress topper  Counseled on re-positioning frequently Home health nursing for wound care  Elevated blood pressure reading BP Readings from Last 3 Encounters:  09/04/18 (!) 159/66   09/04/18 (!) 181/77  08/14/18 (!) 196/64  Hx of syncopal events as well as hyponatremia Cont Valsartan 40 mg. I am hesitant to increase this and cause hypotension Cont Amiodarone  Patient education and anticipatory guidance given Patient agrees with treatment plan Follow-up as needed if symptoms worsen or fail to improve  Levonne Hubert PA-C

## 2018-09-04 NOTE — Patient Instructions (Addendum)
Fall Prevention in the Home, Adult Falls can cause injuries and can affect people from all age groups. There are many simple things that you can do to make your home safe and to help prevent falls. Ask for help when making these changes, if needed. What actions can I take to prevent falls? General instructions  Use good lighting in all rooms. Replace any light bulbs that burn out.  Turn on lights if it is dark. Use night-lights.  Place frequently used items in easy-to-reach places. Lower the shelves around your home if necessary.  Set up furniture so that there are clear paths around it. Avoid moving your furniture around.  Remove throw rugs and other tripping hazards from the floor.  Avoid walking on wet floors.  Fix any uneven floor surfaces.  Add color or contrast paint or tape to grab bars and handrails in your home. Place contrasting color strips on the first and last steps of stairways.  When you use a stepladder, make sure that it is completely opened and that the sides are firmly locked. Have someone hold the ladder while you are using it. Do not climb a closed stepladder.  Be aware of any and all pets. What can I do in the bathroom?      Keep the floor dry. Immediately clean up any water that spills onto the floor.  Remove soap buildup in the tub or shower on a regular basis.  Use non-skid mats or decals on the floor of the tub or shower.  Attach bath mats securely with double-sided, non-slip rug tape.  If you need to sit down while you are in the shower, use a plastic, non-slip stool.  Install grab bars by the toilet and in the tub and shower. Do not use towel bars as grab bars. What can I do in the bedroom?  Make sure that a bedside light is easy to reach.  Do not use oversized bedding that drapes onto the floor.  Have a firm chair that has side arms to use for getting dressed. What can I do in the kitchen?  Clean up any spills right away.  If you need to  reach for something above you, use a sturdy step stool that has a grab bar.  Keep electrical cables out of the way.  Do not use floor polish or wax that makes floors slippery. If you must use wax, make sure that it is non-skid floor wax. What can I do in the stairways?  Do not leave any items on the stairs.  Make sure that you have a light switch at the top of the stairs and the bottom of the stairs. Have them installed if you do not have them.  Make sure that there are handrails on both sides of the stairs. Fix handrails that are broken or loose. Make sure that handrails are as long as the stairways.  Install non-slip stair treads on all stairs in your home.  Avoid having throw rugs at the top or bottom of stairways, or secure the rugs with carpet tape to prevent them from moving.  Choose a carpet design that does not hide the edge of steps on the stairway.  Check any carpeting to make sure that it is firmly attached to the stairs. Fix any carpet that is loose or worn. What can I do on the outside of my home?  Use bright outdoor lighting.  Regularly repair the edges of walkways and driveways and fix any cracks.    Remove high doorway thresholds.  Trim any shrubbery on the main path into your home.  Regularly check that handrails are securely fastened and in good repair. Both sides of any steps should have handrails.  Install guardrails along the edges of any raised decks or porches.  Clear walkways of debris and clutter, including tools and rocks.  Have leaves, snow, and ice cleared regularly.  Use sand or salt on walkways during winter months.  In the garage, clean up any spills right away, including grease or oil spills. What other actions can I take?  Wear closed-toe shoes that fit well and support your feet. Wear shoes that have rubber soles or low heels.  Use mobility aids as needed, such as canes, walkers, scooters, and crutches.  Review your medicines with your  health care provider. Some medicines can cause dizziness or changes in blood pressure, which increase your risk of falling. Talk with your health care provider about other ways that you can decrease your risk of falls. This may include working with a physical therapist or trainer to improve your strength, balance, and endurance. Where to find more information  Centers for Disease Control and Prevention, [employer]: TVDivision.uy  [employer] on Aging: RingConnections.si Contact a health care provider if:  You are afraid of falling at home.  You feel weak, drowsy, or dizzy at home.  You fall at home. Summary  There are many simple things that you can do to make your home safe and to help prevent falls.  Ways to make your home safe include removing tripping hazards and installing grab bars in the bathroom.  Ask for help when making these changes in your home. This information is not intended to replace advice given to you by your health care provider. Make sure you discuss any questions you have with your health care provider. Document Released: 05/14/2002 Document Revised: 01/06/2017 Document Reviewed: 01/06/2017 Elsevier Interactive Patient Education  2019 Else[employer] Inc.   Pressure Injury  A pressure injury is damage to the skin and underlying tissue that results from pressure being applied to an area of the body. It often affects people who must spend a long time in a bed or chair because of a medical condition. Pressure injuries usually occur:  Over bony parts of the body, such as the tailbone, shoulders, elbows, hips, heels, spine, ankles, and back of the head.  Under medical devices that make contact with the body, such as respiratory equipment, stockings, tubes, and splints. Pressure injuries start as reddened areas on the skin and can lead to pain and an open wound. What are the causes? This condition is caused by frequent or constant pressure to an area of  the body. Decreased blood flow to the skin can eventually cause the skin tissue to die and break down, causing a wound. What increases the risk? You are more likely to develop this condition if you:  Are in the hospital or an extended care facility.  Are bedridden or in a wheelchair.  Have an injury or disease that keeps you from: ? Moving normally. ? Feeling pain or pressure.  Have a condition that: ? Makes you sleepy or less alert. ? Causes poor blood flow.  Need to wear a medical device.  Have poor control of your bladder or bowel functions (incontinence).  Have poor nutrition (malnutrition). If you are at risk for pressure injuries, your health care provider may recommend certain types of mattresses, mattress covers, pillows, cushions, or boots to help prevent  them. These may include products filled with air, foam, gel, or sand. What are the signs or symptoms? Symptoms of this condition depend on the severity of the injury. Symptoms may include:  Red or dark areas of the skin.  Pain, warmth, or a change of skin texture.  Blisters.  An open wound. How is this diagnosed? This condition is diagnosed with a medical history and physical exam. You may also have tests, such as:  Blood tests.  Imaging tests.  Blood flow tests. Your pressure injury will be staged based on its severity. Staging is based on:  The depth of the tissue injury, including whether there is exposure of muscle, bone, or tendon.  The cause of the pressure injury. How is this treated? This condition may be treated by:  Relieving or redistributing pressure on your skin. This includes: ? Frequently changing your position. ? Avoiding positions that caused the wound or that can make the wound worse. ? Using specific bed mattresses, chair cushions, or protective boots. ? Moving medical devices from an area of pressure, or placing padding between the skin and the device. ? Using foams, creams, or  powders to prevent rubbing (friction) on the skin.  Keeping your skin clean and dry. This may include using a skin cleanser or skin barrier as told by your health care provider.  Cleaning your injury and removing any dead tissue from the wound (debridement).  Placing a bandage (dressing) over your injury.  Using medicines for pain or to prevent or treat infection. Surgery may be needed if other treatments are not working or if your injury is very deep. Follow these instructions at home: Wound care  Follow instructions from your health care provider about how to take care of your wound. Make sure you: ? Wash your hands with soap and water before and after you change your bandage (dressing). If soap and water are not available, use hand sanitizer. ? Change your dressing as told by your health care provider.  Check your wound every day for signs of infection. Have a caregiver do this for you if you are not able. Check for: ? Redness, swelling, or increased pain. ? More fluid or blood. ? Warmth. ? Pus or a bad smell. Skin care  Keep your skin clean and dry. Gently pat your skin dry.  Do not rub or massage your skin.  You or a caregiver should check your skin every day for any changes in color or any new blisters or sores (ulcers). Medicines  Take over-the-counter and prescription medicines only as told by your health care provider.  If you were prescribed an antibiotic medicine, take or apply it as told by your health care provider. Do not stop using the antibiotic even if your condition improves. Reducing and redistributing pressure  Do not lie or sit in one position for a long time. Move or change position every 1-2 hours, or as told by your health care provider.  Use pillows or cushions to reduce pressure. Ask your health care provider to recommend cushions or pads for you. General instructions   Eat a healthy diet that includes lots of protein.  Drink enough fluid to keep  your urine pale yellow.  Be as active as you can every day. Ask your health care provider to suggest safe exercises or activities.  Do not abuse drugs or alcohol.  Do not use any products that contain nicotine or tobacco, such as cigarettes, e-cigarettes, and chewing tobacco. If you need help  quitting, ask your health care provider.  Keep all follow-up visits as told by your health care provider. This is important. Contact a health care provider if:  You have: ? A fever or chills. ? Pain that is not helped by medicine. ? Any changes in skin color. ? New blisters or sores. ? Pus or a bad smell coming from your wound. ? Redness, swelling, or pain around your wound. ? More fluid or blood coming from your wound.  Your wound does not improve after 1-2 weeks of treatment. Summary  A pressure injury is damage to the skin and underlying tissue that results from pressure being applied to an area of the body.  Do not lie or sit in one position for a long time. Your health care provider may advise you to move or change position every 1-2 hours.  Follow instructions from your health care provider about how to take care of your wound.  Keep all follow-up visits as told by your health care provider. This is important. This information is not intended to replace advice given to you by your health care provider. Make sure you discuss any questions you have with your health care provider. Document Released: 05/24/2005 Document Revised: 12/21/2017 Document Reviewed: 12/21/2017 Elsevier Interactive Patient Education  [employer].

## 2018-09-04 NOTE — Progress Notes (Signed)
Andrew Soto. is a 85 y.o. male who presents to [facility] today for bilateral lateral hip pain.  Patient has been seen multiple times for back pain.  He notes pain in the bilateral hips.  Pain is located laterally worse with standing from seated position.  Patient also notes significant leg weakness and frequent falls.  He was also seen by his primary care provider for sacral decubitus ulcer.  He has been prescribed home health physical therapy and health nursing and wound therapy.  He is using Tylenol arthritis and tramadol for pain control which helps some.  He denies any significant radiating pain weakness or numbness distally.  Bl Troch  ROS:  As above  Exam:  BP (!) 159/66   Pulse 67   Temp 98.4 F (36.9 C) (Oral)   Wt 145 lb (65.8 kg)   BMI 22.71 kg/m  Wt Readings from Last 5 Encounters:  09/04/18 145 lb (65.8 kg)  08/14/18 142 lb (64.4 kg)  08/08/18 148 lb 4.8 oz (67.3 kg)  07/10/18 151 lb (68.5 kg)  07/03/18 152 lb (68.9 kg)   General: Well Developed, well nourished, and in no acute distress.  Neuro/Psych: Alert and oriented x3, extra-ocular muscles intact, able to move all 4 extremities, sensation grossly intact. Skin: Warm and dry, no rashes noted.  Respiratory: Not using accessory muscles, speaking in full sentences, trachea midline.  Cardiovascular: Pulses palpable, no extremity edema. Abdomen: Does not appear distended. MSK:  Hips bilaterally normal-appearing normal motion.  Tender palpation bilateral lateral hips the greater trochanter. Hip flexion adduction strength is intact.  Abduction strength diminished 4/5 bilaterally. Knee extension strength in flexion bilaterally is intact. Right foot dorsiflexion plantarflexion strength intact. Left foot dorsiflexion plantarflexion limited patient uses AFO.    Lab and Radiology Results No results found for this or any previous visit (from the past 72 hour(s)). Dg  Sacrum/coccyx  Result Date: 09/04/2018 CLINICAL DATA:  85 year old male with fall and lower back pain EXAM: SACRUM AND COCCYX - 2+ VIEW COMPARISON:  None. FINDINGS: Osteopenia. No acute displaced fracture of the L4 or L5 vertebral body. No acute displaced fracture of the sacrum or coccyx. Degenerative changes of the sacrum and coccyx. No acute angulated cortex. Vascular calcifications with evidence of prior bilateral iliac artery stenting. Surgical changes of the anatomic pelvis. Lower lumbar degenerative changes, similar to prior CT. IMPRESSION: Negative for acute displaced fracture. Electronically Signed   By: Gilmer Mor D.O.   On: 09/04/2018 09:27     I personally (independently) visualized and performed the interpretation of the images attached in this note.   Assessment and Plan: 85 y.o. male with bilateral hip pain due to trochanteric bursitis.  Work with home health physical therapy for strengthening and stretching.  Optimize over-the-counter medication for pain control.  Limited tramadol for pain control.  Recheck as needed.  Try to limit office-based visits if possible.   PDMP not reviewed this encounter. No orders of the defined types were placed in this encounter.  No orders of the defined types were placed in this encounter.   Historical information moved to improve visibility of documentation.  Past Medical History:  Diagnosis Date  . Actinic keratoses   . Aortic stenosis   . Atrial fibrillation (HCC)   . CVA (cerebral vascular accident) (HCC)   . High cholesterol   . History of colon cancer   . History of CVA (cerebrovascular accident)   . Hypertension   .  Lower urinary tract symptoms (LUTS)   . Pancreatitis, recurrent    Past Surgical History:  Procedure Laterality Date  . ABDOMINAL SURGERY    . CHOLECYSTECTOMY    . COLON RESECTION    . MOHS SURGERY     Right eyebrow  . PACEMAKER IMPLANT    . VALVE REPLACEMENT     Social History   Tobacco Use  . Smoking  status: Former Smoker    Packs/day: 1.00    Years: 5.00    Pack years: 5.00    Types: Cigarettes  . Smokeless tobacco: Never Used  Substance Use Topics  . Alcohol use: Yes    Comment: glass wine daily   family history includes Testicular cancer in his father.  Medications: Current Outpatient Medications  Medication Sig Dispense Refill  . AMBULATORY NON FORMULARY MEDICATION Motorized wheelchair. Disp 1 R26.9 1 each 0  . amiodarone (PACERONE) 200 MG tablet Take 1 tablet (200 mg total) by mouth daily. 90 tablet 1  . aspirin 81 MG tablet Take 81 mg by mouth daily.     . Cholecalciferol (VITAMIN D3 PO) Take 1 tablet by mouth daily.    . clotrimazole-betamethasone (LOTRISONE) cream Apply 1 application topically 2 (two) times daily. To affected skin until rash resolves then continue for 5-7 days after that 45 g 0  . cyanocobalamin ([employer] VITAMIN B12) 1000 MCG tablet Take 1,000 mcg by mouth daily.     . diclofenac sodium (VOLTAREN) 1 % GEL Apply 4 g topically 4 (four) times daily. To affected joint. 400 g 11  . DULoxetine (CYMBALTA) 20 MG capsule Take 2 capsules (40 mg total) by mouth daily. 180 capsule 3  . ezetimibe (ZETIA) 10 MG tablet Take 1 tablet (10 mg total) by mouth daily. 90 tablet 1  . famotidine (PEPCID) 20 MG tablet Take 1 tablet (20 mg total) by mouth 2 (two) times daily as needed for heartburn or indigestion.    Marland Kitchen ketoconazole (NIZORAL) 2 % shampoo     . NONFORMULARY OR COMPOUNDED ITEM Gel donut cushion 1 each 0  . NONFORMULARY OR COMPOUNDED ITEM Gel mattress topper for pressure relief 1 each 0  . tamsulosin (FLOMAX) 0.4 MG CAPS capsule TAKE 1 CAPSULE BY MOUTH EVERY DAY 90 capsule 1  . traMADol (ULTRAM) 50 MG tablet Take 1 tablet (50 mg total) by mouth every 6 (six) hours as needed for moderate pain or severe pain. Maximum 6 tabs per day. 60 tablet 0  . valsartan (DIOVAN) 40 MG tablet Take 1 tablet (40 mg total) by mouth every morning. 90 tablet 0   No current  facility-administered medications for this visit.    Allergies  Allergen Reactions  . Epinephrine Other (See Comments)    Dizziness/double vision  . Penicillins Rash  . Vicodin [Hydrocodone-Acetaminophen] Nausea Only    Mood changes, Fatigue      Discussed warning signs or symptoms. Please see discharge instructions. Patient expresses understanding.

## 2018-09-19 ENCOUNTER — Encounter: Payer: Self-pay | Admitting: Family Medicine

## 2018-09-19 ENCOUNTER — Ambulatory Visit (INDEPENDENT_AMBULATORY_CARE_PROVIDER_SITE_OTHER): Payer: Medicare Other | Admitting: Family Medicine

## 2018-09-19 ENCOUNTER — Other Ambulatory Visit: Payer: Self-pay

## 2018-09-19 VITALS — BP 185/64 | HR 69

## 2018-09-19 DIAGNOSIS — S51002A Unspecified open wound of left elbow, initial encounter: Secondary | ICD-10-CM

## 2018-09-19 DIAGNOSIS — S0101XA Laceration without foreign body of scalp, initial encounter: Secondary | ICD-10-CM | POA: Diagnosis not present

## 2018-09-19 DIAGNOSIS — D692 Other nonthrombocytopenic purpura: Secondary | ICD-10-CM

## 2018-09-19 DIAGNOSIS — R296 Repeated falls: Secondary | ICD-10-CM | POA: Diagnosis not present

## 2018-09-19 NOTE — Patient Instructions (Addendum)
Thank you for coming in today. Keep an eye on the head injury.  Look out for change in behavior or confusion.  If changing next step is CT scan here in the office which is less risky than the emergency room.   Manage the skin avulsion with ointment and non-adherent dressing.   Mange scalp laceration with dressing as needed.    Deep Skin Avulsion A deep skin avulsion is a type of open wound. It often results from a severe injury (trauma) that tears away all layers of the skin or an entire body part. The areas of the body that are most often affected include the face, lips, ears, nose, and fingers. A deep skin avulsion may make structures below the skin become visible. You may be able to see muscle, bone, nerves, and blood vessels. A deep skin avulsion can also damage important structures beneath the skin. These include bones, tendons, nerves, or blood vessels. What are the causes? This condition may be caused by an injury, such as:  Being crushed.  Falling against a jagged surface.  An animal bite.  A gunshot wound.  A severe burn.  Being dragged, such as in a bicycle or motorcycle accident. What are the signs or symptoms? Symptoms of this condition include:  Pain.  Numbness.  Swelling.  Bleeding, which may be heavy. How is this diagnosed? This condition may be diagnosed with a medical history and physical exam. You may also have X-rays done. How is this treated? Treatment for this condition depends on how large and deep the wound is and where it is located. Treatment usually starts with:  Controlling the bleeding.  Washing out the wound with a germ-free (sterile) solution. After initial treatment, the wound may be closed or left open to heal.  Wounds that are small and clean may be closed with stitches (sutures).  Wounds that cannot be closed with sutures may be covered with a piece of skin (graft) or your own skin flap.  Wounds that are hard to close or that may  become infected may be left open. These wounds heal over time from the inside out. You may also be treated with medicine, such as:  Antibiotic medicine.  Pain medicine.  Tetanus shot. Follow these instructions at home: Medicines  If you were prescribed an antibiotic medicine, take or apply it as told by your health care provider. Do not stop taking or using the antibiotic even if your condition improves.  Take over-the-counter and prescription medicines only as told by your health care provider.  If you were prescribed a pain medicine, take it 30 minutes or more before you do any wound care, or as told by your health care provider.  Do not drive or use heavy machinery while taking prescription pain medicine. Wound care   Follow instructions from your health care provider about how to take care of your wound. Make sure you: ? Wash your hands with soap and water before you change your bandage (dressing). If soap and water are not available, use hand sanitizer. ? Change your dressing as told by your health care provider. ? Leave stitches (sutures), skin glue, or adhesive strips in place. These skin closures may need to stay in place for 2 weeks or longer. If adhesive strip edges start to loosen and curl up, you may trim the loose edges. Do not remove adhesive strips completely unless your health care provider tells you to do that.  Check your wound every day for signs of  infection. Check for: ? Redness, swelling, or pain. ? Fluid or blood. ? Warmth. ? Pus or a bad smell.  Keep your dressing clean and dry. Do not take baths, swim, use a hot tub, or do anything that would put your wound under water until your health care provider approves.  Clean the wound each day, or as told by your health care provider: ? Wash the wound with mild soap and water. ? Rinse the wound with water to remove all soap. ? Pat the wound dry with a clean towel. Do not rub it. ? Cover the wound with a clean  dressing.  Do not scratch or pick at the wound. General instructions  Raise (elevate) the injured area above the level of your heart while you are sitting or lying down.  Do not use any products that contain nicotine or tobacco, such as cigarettes and e-cigarettes. These may delay wound healing. If you need help quitting, ask your health care provider.  Eat a healthy diet that includes foods rich in protein, vitamin A, and vitamin C to help your wound heal.  Keep all follow-up visits as told by your health care provider. This is important. Contact a health care provider if:  You have: ? Pain that does not get better with medicine. ? More redness, swelling, or pain around your wound. ? More fluid or blood coming from your wound. ? A fever.  You got a tetanus shot and you have swelling, severe pain, redness, or bleeding at the injection site.  You are nauseous or you vomit.  You notice something coming out of the wound, such as wood or glass. Get help right away if:  You have a red streak going away from your wound.  A wound that was closed breaks open.  The wound is bleeding, and the bleeding does not stop with gentle pressure.  You have trouble breathing.  The wound is on your hand or foot and: ? You cannot properly move a finger or toe. ? Your fingers or toes look pale or bluish. Summary  A deep skin avulsion is a type of injury that can damage important structures beneath the skin.  A wound may be closed or left open to heal. This depends on the size and location of the wound and whether it is likely to become infected.  Follow instructions from your health care provider about how to take care of your wound.  Contact your health care provider if you have increased redness, swelling, or pain around your wound, or your pain is not controlled with medicine. This information is not intended to replace advice given to you by your health care provider. Make sure you discuss  any questions you have with your health care provider. Document Released: 07/20/2006 Document Revised: 05/19/2017 Document Reviewed: 05/19/2017 Elsevier Interactive Patient Education  2019 [employer]   Laceration Care, Adult A laceration is a cut that may go through all layers of the skin. The cut may also go into the tissue that is right under the skin. Some cuts heal on their own. Others need to be closed with stitches (sutures), staples, skin adhesive strips, or skin glue. Taking care of your injury lowers your risk of infection, helps your injury to heal better, and may prevent scarring. Supplies needed:  Soap.  Water.  Hand sanitizer.  Bandage (dressing).  Antibiotic ointment.  Clean towel. How to take care of your cut Wash your hands with soap and water before touching your wound  or changing your bandage. If soap and water are not available, use hand sanitizer. If your doctor used stitches or staples:  Keep the wound clean and dry.  If you were given a bandage, change it at least once a day as told by your doctor. You should also change it if it gets wet or dirty.  Keep the wound completely dry for the first 24 hours, or as told by your doctor. After that, you may take a shower or a bath. Do not get the wound soaked in water until after the stitches or staples have been removed.  Clean the wound once a day, or as told by your doctor: ? Wash the wound with soap and water. ? Rinse the wound with water to remove all soap. ? Pat the wound dry with a clean towel. Do not rub the wound.  After you clean the wound, put a thin layer of antibiotic ointment on it as told by your doctor. This ointment: ? Helps to prevent infection. ? Keeps the bandage from sticking to the wound.  Have your stitches or staples removed as told by your doctor. If your doctor used skin adhesive strips:  Keep the wound clean and dry.  If you were given a bandage, you should change it at least  once a day as told by your doctor. You should also change it if it gets wet or dirty.  Do not get the skin adhesive strips wet. You can take a shower or a bath, but keep the wound dry.  If the wound gets wet, pat it dry with a clean towel. Do not rub the wound.  Skin adhesive strips fall off on their own. You can trim the strips as the wound heals. Do not remove any strips that are still stuck to the wound. They will fall off after a while. If your doctor used skin glue:  Try to keep your wound dry, but you may briefly wet it in the shower or bath. Do not soak the wound in water, such as by swimming.  After you take a shower or a bath, gently pat the wound dry with a clean towel. Do not rub the wound.  Do not do any activities that will make you really sweaty until the skin glue has fallen off on its own.  Do not apply liquid, cream, or ointment medicine to your wound while the skin glue is still on.  If you were given a bandage, you should change it at least once a day or as told by your doctor. You should also change it if it gets dirty or wet.  If a bandage is placed over the wound, do not let the tape touch the skin glue.  Do not pick at the glue. The skin glue usually stays on for 5-10 days. Then, it falls off the skin. General instructions   Take over-the-counter and prescription medicines only as told by your doctor.  If you were given antibiotic medicine or ointment, take or apply it as told by your doctor. Do not stop using it even if your condition improves.  Do not scratch or pick at the wound.  Check your wound every day for signs of infection. Watch for: ? Redness, swelling, or pain. ? Fluid, blood, or pus.  Raise (elevate) the injured area above the level of your heart while you are sitting or lying down.  If directed, put ice on the affected area: ? Put ice in a plastic bag. ?  Place a towel between your skin and the bag. ? Leave the ice on for 20 minutes, 2-3  times a day.  Prevent scarring by covering your wound with sunscreen of at least 30 SPF whenever you are outside after your wound has healed.  Keep all follow-up visits as told by your doctor. This is important. Get help if:  You got a tetanus shot and you have any of these problems at the injection site: ? Swelling. ? Very bad pain. ? Redness. ? Bleeding.  You have a fever.  A wound that was closed breaks open.  You notice a bad smell coming from your wound or your bandage.  You notice something coming out of the wound, such as wood or glass.  Medicine does not relieve your pain.  You have more redness, swelling, or pain at the site of your wound.  You have fluid, blood, or pus coming from your wound.  You notice a change in the color of your skin near your wound.  You need to change the bandage often because fluid, blood, or pus is coming from the wound.  You start to have a new rash.  You start to have numbness around the wound. Get help right away if:  You have very bad swelling around the wound.  Your pain suddenly gets worse and is very bad.  You notice painful lumps near the wound or anywhere on your body.  You have a red streak going away from your wound.  The wound is on your hand or foot, and: ? You cannot move a finger or toe. ? Your fingers or toes look pale or bluish. Summary  A laceration is a cut that may go through all layers of the skin. The cut may also go into the tissue right under the skin.  Some cuts heal on their own. Others need to be closed with stitches, staples, skin adhesive strips, or skin glue.  Follow your doctor's instructions for caring for your cut. Proper care of a cut lowers the risk of infection, helps the cut heal better, and prevents scarring. This information is not intended to replace advice given to you by your health care provider. Make sure you discuss any questions you have with your health care provider. Document  Released: 11/10/2007 Document Revised: 06/13/2017 Document Reviewed: 06/13/2017 Elsevier Interactive Patient Education  2019 [employer].

## 2018-09-19 NOTE — Progress Notes (Signed)
Andrew Ill. is a 85 y.o. male who presents to [facility] today for fall.  Patient tripped and fell today landing on his left arm hitting his left head.  He did not have a high velocity fall.  He is acting normally per his son.  He denies significant headache lightheadedness or dizziness.  He suffered a left elbow skin avulsion and left scalp laceration or abrasion.  His son treated it with antibiotic ointment and nonadherent dressing  Last tetanus vaccine was March 2016  ROS as above:  Exam:  BP (!) 185/64    Pulse 69  Wt Readings from Last 5 Encounters:  09/04/18 145 lb (65.8 kg)  08/14/18 142 lb (64.4 kg)  08/08/18 148 lb 4.8 oz (67.3 kg)  07/10/18 151 lb (68.5 kg)  07/03/18 152 lb (68.9 kg)    Gen: Well NAD HEENT: EOMI,  MMM small closed less than 1 cm left temporal scalp laceration.  Not currently bleeding.  Not particularly tender. Lungs: Normal work of breathing. CTABL Heart: RRR no MRG Abd: NABS, Soft. Nondistended, Nontender Exts: Brisk capillary refill, warm and well perfused.  Left arm: Several large skin avulsions left elbow and left distal upper arm.  No active bleeding.  No deep structures involved. Neuro: Alert and oriented normal coordination speech and thought process.  Patient moves all extremities symmetrically with normal facial motion. Senile purpura present bilateral upper extremities   Assessment and Plan: 85 y.o. male with  Mechanical fall with skin avulsion and scalp laceration.  Fortunately both the skin avulsion and the scalp laceration were managed correctly at home and do not require significant intervention.  Clinic.  The scalp laceration is closed and not currently bleeding therefore I do not think that staples are needed.  The skin avulsion was well treated with nonadherent dressing and antibiotic ointment.  Plan to  continue wound care at home.  The fall was mechanical and patient is currently receiving home health physical therapy for his frequent falls.  Additionally he is neurologically unchanged from his baseline per my exam and from his son's and the patient's impression as well.  We discussed CT scanning.  I think at this point with COVID-19 pandemic CT scan in the emergency room would likely be higher risk than not doing a CT scan especially as his neurological status has not changed.  Plan for watchful waiting with outpatient CT scan in the future if needed.  Follow-up with PCP in the near future as needed as well.  PDMP not reviewed this encounter. No orders of the defined types were placed in this encounter.  No orders of the defined types were placed in this encounter.    Historical information moved to improve visibility of documentation.  Past Medical History:  Diagnosis Date   Actinic keratoses    Aortic stenosis    Atrial fibrillation (HCC)    CVA (cerebral vascular accident) (HCC)    High cholesterol    History of colon cancer    History of CVA (cerebrovascular accident)    Hypertension    Lower urinary tract symptoms (LUTS)    Pancreatitis, recurrent    Past Surgical History:  Procedure Laterality Date   ABDOMINAL SURGERY     CHOLECYSTECTOMY     COLON RESECTION     MOHS SURGERY     Right eyebrow   PACEMAKER IMPLANT     VALVE REPLACEMENT     Social  History   Tobacco Use   Smoking status: Former Smoker    Packs/day: 1.00    Years: 5.00    Pack years: 5.00    Types: Cigarettes   Smokeless tobacco: Never Used  Substance Use Topics   Alcohol use: Yes    Comment: glass wine daily   family history includes Testicular cancer in his father.  Medications: Current Outpatient Medications  Medication Sig Dispense Refill   AMBULATORY NON FORMULARY MEDICATION Motorized wheelchair. Disp 1 R26.9 1 each 0   amiodarone (PACERONE) 200 MG tablet Take 1  tablet (200 mg total) by mouth daily. 90 tablet 1   aspirin 81 MG tablet Take 81 mg by mouth daily.      Cholecalciferol (VITAMIN D3 PO) Take 1 tablet by mouth daily.     clotrimazole-betamethasone (LOTRISONE) cream Apply 1 application topically 2 (two) times daily. To affected skin until rash resolves then continue for 5-7 days after that 45 g 0   cyanocobalamin ([employer] VITAMIN B12) 1000 MCG tablet Take 1,000 mcg by mouth daily.      diclofenac sodium (VOLTAREN) 1 % GEL Apply 4 g topically 4 (four) times daily. To affected joint. 400 g 11   DULoxetine (CYMBALTA) 20 MG capsule Take 2 capsules (40 mg total) by mouth daily. 180 capsule 3   ezetimibe (ZETIA) 10 MG tablet Take 1 tablet (10 mg total) by mouth daily. 90 tablet 1   famotidine (PEPCID) 20 MG tablet Take 1 tablet (20 mg total) by mouth 2 (two) times daily as needed for heartburn or indigestion.     ketoconazole (NIZORAL) 2 % shampoo      NONFORMULARY OR COMPOUNDED ITEM Gel donut cushion 1 each 0   NONFORMULARY OR COMPOUNDED ITEM Gel mattress topper for pressure relief 1 each 0   tamsulosin (FLOMAX) 0.4 MG CAPS capsule TAKE 1 CAPSULE BY MOUTH EVERY DAY 90 capsule 1   traMADol (ULTRAM) 50 MG tablet Take 1 tablet (50 mg total) by mouth every 6 (six) hours as needed for moderate pain or severe pain. Maximum 6 tabs per day. 60 tablet 0   valsartan (DIOVAN) 40 MG tablet Take 1 tablet (40 mg total) by mouth every morning. 90 tablet 0   No current facility-administered medications for this visit.    Allergies  Allergen Reactions   Epinephrine Other (See Comments)    Dizziness/double vision   Penicillins Rash   Vicodin [Hydrocodone-Acetaminophen] Nausea Only    Mood changes, Fatigue     Discussed warning signs or symptoms. Please see discharge instructions. Patient expresses understanding.

## 2018-09-25 ENCOUNTER — Telehealth: Payer: Self-pay

## 2018-09-25 NOTE — Telephone Encounter (Signed)
Andrew Soto with [facility] called and ask if they could have orders for wound care. He had a fall and was seen last week by Dr Denyse Amass. He has a skin tear on his arm and they would like to treat it with a xeroform gauze. Gave verbal for wound care.

## 2018-09-25 NOTE — Telephone Encounter (Signed)
Thank you, Marylene Land Let me know if you need me to place electronic orders

## 2018-10-02 ENCOUNTER — Telehealth: Payer: Self-pay | Admitting: *Deleted

## 2018-10-02 NOTE — Telephone Encounter (Signed)
Calling patient today to discuss upcoming appointment.  We are currently trying to limit exposure to the virus that causes COVID-19 by seeing patients at home rather than in the office. We would like to schedule this appointment as a Virtual Appointment VIA Smartphone or Laptop. Unable to reach patient.  LVMTCB  

## 2018-10-03 ENCOUNTER — Ambulatory Visit: Payer: Medicare Other | Admitting: Physician Assistant

## 2018-10-03 ENCOUNTER — Telehealth: Payer: Self-pay

## 2018-10-03 NOTE — Telephone Encounter (Signed)
Physical therapist Cindee Lame from [facility] left a vm msg. Requesting to extend pt's at home physical therapy due to fall last week. Pt was due to finish at home physical therapy on 10/09/18. As per Cindee Lame - physical therapy will be 1x time for 1 week and 1 time for 5 weeks. Contacted Pete, verbally order given to continue new order for physical therapy. No other inquiries during the call.

## 2018-10-04 ENCOUNTER — Ambulatory Visit: Payer: Medicare Other | Admitting: Physician Assistant

## 2018-10-04 ENCOUNTER — Other Ambulatory Visit: Payer: Self-pay | Admitting: Physician Assistant

## 2018-10-04 DIAGNOSIS — I4891 Unspecified atrial fibrillation: Secondary | ICD-10-CM

## 2018-10-05 NOTE — Telephone Encounter (Signed)
Virtual Visit Pre-Appointment Phone Call  "(Name), I am calling you today to discuss your upcoming appointment. We are currently trying to limit exposure to the virus that causes COVID-19 by seeing patients at home rather than in the office."  1. "What is the BEST phone number to call the day of the visit?" - include this in appointment notes  2. "Do you have or have access to (through a family member/friend) a smartphone with video capability that we can use for your visit?" a. If yes - list this number in appt notes as "cell" (if different from BEST phone #) and list the appointment type as a VIDEO visit in appointment notes b. If no - list the appointment type as a PHONE visit in appointment notes  3. Confirm consent - "In the setting of the current Covid19 crisis, you are scheduled for a (phone or video) visit with your provider on (date) at (time).  Just as we do with many in-office visits, in order for you to participate in this visit, we must obtain consent.  If you'd like, I can send this to your mychart (if signed up) or email for you to review.  Otherwise, I can obtain your verbal consent now.  All virtual visits are billed to your insurance company just like a normal visit would be.  By agreeing to a virtual visit, we'd like you to understand that the technology does not allow for your provider to perform an examination, and thus may limit your provider's ability to fully assess your condition. If your provider identifies any concerns that need to be evaluated in person, we will make arrangements to do so.  Finally, though the technology is pretty good, we cannot assure that it will always work on either your or our end, and in the setting of a video visit, we may have to convert it to a phone-only visit.  In either situation, we cannot ensure that we have a secure connection.  Are you willing to proceed?" STAFF: Did the patient verbally acknowledge consent to telehealth visit? Document  YES/NO here: YES  4. Advise patient to be prepared - "Two hours prior to your appointment, go ahead and check your blood pressure, pulse, oxygen saturation, and your weight (if you have the equipment to check those) and write them all down. When your visit starts, your provider will ask you for this information. If you have an Apple Watch or Kardia device, please plan to have heart rate information ready on the day of your appointment. Please have a pen and paper handy nearby the day of the visit as well."  5. Give patient instructions for MyChart download to smartphone OR Doximity/Doxy.me as below if video visit (depending on what platform provider is using)  6. Inform patient they will receive a phone call 15 minutes prior to their appointment time (may be from unknown caller ID) so they should be prepared to answer    TELEPHONE CALL NOTE  Andrew IllJames M Panchal Jr. has been deemed a candidate for a follow-up tele-health visit to limit community exposure during the Covid-19 pandemic. I spoke with the patient via phone to ensure availability of phone/video source, confirm preferred email & phone number, and discuss instructions and expectations.  I reminded Andrew IllJames M Meacham Jr. to be prepared with any vital sign and/or heart rhythm information that could potentially be obtained via home monitoring, at the time of his visit. I reminded Andrew IllJames M Enfield Jr. to expect a phone  call prior to his visit.  Garet Hooton 10/05/2018 3:40 PM   INSTRUCTIONS FOR DOWNLOADING THE MYCHART APP TO SMARTPHONE  - The patient must first make sure to have activated MyChart and know their login information - If Apple, go to [employer] and type in MyChart in the search bar and download the app. If Android, ask patient to go to [employer] and type in [place] in the search bar and download the app. The app is free but as with any other app downloads, their phone may require them to verify saved payment information or  Apple/Android password.  - The patient will need to then log into the app with their MyChart username and password, and select [facility] as their healthcare provider to link the account. When it is time for your visit, go to the MyChart app, find appointments, and click Begin Video Visit. Be sure to Select Allow for your device to access the Microphone and Camera for your visit. You will then be connected, and your provider will be with you shortly.  **If they have any issues connecting, or need assistance please contact MyChart service desk [phone])**  **If using a computer, in order to ensure the best quality for their visit they will need to use either of the following Internet Browsers: [employer] or Google Chrome**  IF USING DOXIMITY or DOXY.ME - The patient will receive a link just prior to their visit by text.     FULL LENGTH CONSENT FOR TELE-HEALTH VISIT   I hereby voluntarily request, consent and authorize [facility] and its employed or contracted physicians, physician assistants, nurse practitioners or other licensed health care professionals (the Practitioner), to provide me with telemedicine health care services (the "Services") as deemed necessary by the treating Practitioner. I acknowledge and consent to receive the Services by the Practitioner via telemedicine. I understand that the telemedicine visit will involve communicating with the Practitioner through live audiovisual communication technology and the disclosure of certain medical information by electronic transmission. I acknowledge that I have been given the opportunity to request an in-person assessment or other available alternative prior to the telemedicine visit and am voluntarily participating in the telemedicine visit.  I understand that I have the right to withhold or withdraw my consent to the use of telemedicine in the course of my care at any time, without affecting my right to future care  or treatment, and that the Practitioner or I may terminate the telemedicine visit at any time. I understand that I have the right to inspect all information obtained and/or recorded in the course of the telemedicine visit and may receive copies of available information for a reasonable fee.  I understand that some of the potential risks of receiving the Services via telemedicine include:  Marland Kitchen Delay or interruption in medical evaluation due to technological equipment failure or disruption; . Information transmitted may not be sufficient (e.g. poor resolution of images) to allow for appropriate medical decision making by the Practitioner; and/or  . In rare instances, security protocols could fail, causing a breach of personal health information.  Furthermore, I acknowledge that it is my responsibility to provide information about my medical history, conditions and care that is complete and accurate to the best of my ability. I acknowledge that Practitioner's advice, recommendations, and/or decision may be based on factors not within their control, such as incomplete or inaccurate data provided by me or distortions of diagnostic images or specimens that may result from electronic  transmissions. I understand that the practice of medicine is not an exact science and that Practitioner makes no warranties or guarantees regarding treatment outcomes. I acknowledge that I will receive a copy of this consent concurrently upon execution via email to the email address I last provided but may also request a printed copy by calling the office of [facility].    I understand that my insurance will be billed for this visit.   I have read or had this consent read to me. . I understand the contents of this consent, which adequately explains the benefits and risks of the Services being provided via telemedicine.  . I have been provided ample opportunity to ask questions regarding this consent and the Services and have had  my questions answered to my satisfaction. . I give my informed consent for the services to be provided through the use of telemedicine in my medical care  By participating in this telemedicine visit I agree to the above.

## 2018-10-05 NOTE — Telephone Encounter (Signed)
LMTCB

## 2018-10-07 ENCOUNTER — Other Ambulatory Visit: Payer: Self-pay | Admitting: Physician Assistant

## 2018-10-07 DIAGNOSIS — Z8673 Personal history of transient ischemic attack (TIA), and cerebral infarction without residual deficits: Secondary | ICD-10-CM

## 2018-10-07 DIAGNOSIS — I10 Essential (primary) hypertension: Secondary | ICD-10-CM

## 2018-10-09 ENCOUNTER — Other Ambulatory Visit: Payer: Self-pay | Admitting: Physician Assistant

## 2018-10-09 ENCOUNTER — Other Ambulatory Visit: Payer: Self-pay

## 2018-10-09 ENCOUNTER — Telehealth (INDEPENDENT_AMBULATORY_CARE_PROVIDER_SITE_OTHER): Payer: Medicare Other | Admitting: Cardiology

## 2018-10-09 DIAGNOSIS — I10 Essential (primary) hypertension: Secondary | ICD-10-CM

## 2018-10-09 DIAGNOSIS — I480 Paroxysmal atrial fibrillation: Secondary | ICD-10-CM | POA: Diagnosis not present

## 2018-10-09 MED ORDER — VALSARTAN 40 MG PO TABS: 40.0000 mg | ORAL_TABLET | ORAL | 1 refills | Status: DC

## 2018-10-09 NOTE — Progress Notes (Signed)
Electrophysiology TeleHealth Note   Due to national recommendations of social distancing due to COVID 19, an audio/video telehealth visit is felt to be most appropriate for this patient at this time.  See Epic message for the patient's consent to telehealth for [facility].   Date:  10/09/2018   ID:  Andrew IllJames M Bartleson Jr., DOB Oct 16, 1931, MRN [id number]  Location: patient's home  Provider location: [address], [place]  Evaluation Performed: Follow-up visit  PCP:  Carlis Stableummings, Charley Elizabeth, PA-C  Cardiologist:  Jens Somrenshaw Electrophysiologist:  Dr Elberta Fortisamnitz  Chief Complaint:  Atrial fibrillation  History of Present Illness:    Andrew IllJames M Zeigler Jr. is a 85 y.o. male who presents via audio/video conferencing for a telehealth visit today.  Since last being seen in our clinic, the patient reports doing very well.  Today, he denies symptoms of palpitations, chest pain, shortness of breath,  lower extremity edema, dizziness, presyncope, or syncope.  The patient is otherwise without complaint today.  The patient denies symptoms of fevers, chills, cough, or new SOB worrisome for COVID 19.  He has a history of atrial fibrillation, CVA, hypertension, hyperlipidemia.  He has a history of falls.  He fell in 2019 and had a lateral hip hematoma as well as a scalp hematoma.  Today, denies symptoms of palpitations, chest pain, shortness of breath, orthopnea, PND, lower extremity edema, claudication, dizziness, presyncope, syncope, bleeding, or neurologic sequela. The patient is tolerating medications without difficulties.  He has not had any further episodes of syncope since his pacemaker was adjusted.  Past Medical History:  Diagnosis Date  . Actinic keratoses   . Aortic stenosis   . Atrial fibrillation (HCC)   . CVA (cerebral vascular accident) (HCC)   . High cholesterol   . History of colon cancer   . History of CVA (cerebrovascular accident)   . Hypertension   . Lower urinary  tract symptoms (LUTS)   . Pancreatitis, recurrent     Past Surgical History:  Procedure Laterality Date  . ABDOMINAL SURGERY    . CHOLECYSTECTOMY    . COLON RESECTION    . MOHS SURGERY     Right eyebrow  . PACEMAKER IMPLANT    . VALVE REPLACEMENT      Current Outpatient Medications  Medication Sig Dispense Refill  . AMBULATORY NON FORMULARY MEDICATION Motorized wheelchair. Disp 1 R26.9 1 each 0  . amiodarone (PACERONE) 200 MG tablet TAKE 1 TABLET BY MOUTH EVERY DAY 90 tablet 1  . aspirin 81 MG tablet Take 81 mg by mouth daily.     . Cholecalciferol (VITAMIN D3 PO) Take 1 tablet by mouth daily.    . clotrimazole-betamethasone (LOTRISONE) cream Apply 1 application topically 2 (two) times daily. To affected skin until rash resolves then continue for 5-7 days after that 45 g 0  . cyanocobalamin (CVS VITAMIN B12) 1000 MCG tablet Take 1,000 mcg by mouth daily.     . diclofenac sodium (VOLTAREN) 1 % GEL Apply 4 g topically 4 (four) times daily. To affected joint. 400 g 11  . DULoxetine (CYMBALTA) 20 MG capsule Take 2 capsules (40 mg total) by mouth daily. 180 capsule 3  . ezetimibe (ZETIA) 10 MG tablet Take 1 tablet (10 mg total) by mouth daily. 90 tablet 1  . famotidine (PEPCID) 20 MG tablet Take 1 tablet (20 mg total) by mouth 2 (two) times daily as needed for heartburn or indigestion.    Marland Kitchen. ketoconazole (NIZORAL) 2 % shampoo     .  NONFORMULARY OR COMPOUNDED ITEM Gel donut cushion 1 each 0  . NONFORMULARY OR COMPOUNDED ITEM Gel mattress topper for pressure relief 1 each 0  . tamsulosin (FLOMAX) 0.4 MG CAPS capsule TAKE 1 CAPSULE BY MOUTH EVERY DAY 90 capsule 1  . traMADol (ULTRAM) 50 MG tablet Take 1 tablet (50 mg total) by mouth every 6 (six) hours as needed for moderate pain or severe pain. Maximum 6 tabs per day. 60 tablet 0  . valsartan (DIOVAN) 40 MG tablet Take 1 tablet (40 mg total) by mouth every morning. 90 tablet 1   No current facility-administered medications for this visit.      Allergies:   Epinephrine; Penicillins; and Vicodin [hydrocodone-acetaminophen]   Social History:  The patient  reports that he has quit smoking. His smoking use included cigarettes. He has a 5.00 pack-year smoking history. He has never used smokeless tobacco. He reports current alcohol use. He reports that he does not use drugs.   Family History:  The patient's  family history includes Testicular cancer in his father.   ROS:  Please see the history of present illness.   All other systems are personally reviewed and negative.    Exam:    Vital Signs:  BP (!) 130/49   Well appearing, alert and conversant, regular work of breathing,  good skin color Eyes- anicteric, neuro- grossly intact, skin- no apparent rash or lesions or cyanosis, mouth- oral mucosa is pink   Labs/Other Tests and Data Reviewed:    Recent Labs: 08/14/2018: ALT 18; BUN 13; Creat 1.01; Hemoglobin 14.5; Platelets 220; Potassium 4.3; Sodium 132; TSH 4.32   Wt Readings from Last 3 Encounters:  09/04/18 145 lb (65.8 kg)  08/14/18 142 lb (64.4 kg)  08/08/18 148 lb 4.8 oz (67.3 kg)     Other studies personally reviewed: Additional studies/ records that were reviewed today include: ECG 06/30/2018 personally reviewed Review of the above records today demonstrates:   Sinus rhythm, first-degree AV block, left bundle branch block  ASSESSMENT & PLAN:    1.  Paroxysmal atrial fibrillation: Currently on amiodarone.  Not anticoagulated due to falls.  Appears to be in sinus rhythm.  We Walid Haig continue amiodarone.  2.  Hypertension: Currently well controlled  3.  Syncope, possibly vasovagal: Status post [facility] pacemaker.  Rate drop was turned on at his last visit.  Has not had any further episodes of syncope since rate drop was turned on.  We Madie Cahn continue to monitor.  Should he continue to avoid syncope and falls, it may be worth restarting anticoagulation in the future.  Of note, we have not received remote  interrogations.  I have asked him to move his transmitter closer to his bed.  If we do not receive any further remotes, may need troubleshooting with device clinic.   COVID 19 screen The patient denies symptoms of COVID 19 at this time.  The importance of social distancing was discussed today.  Follow-up: 6 months  Current medicines are reviewed at length with the patient today.   The patient does not have concerns regarding his medicines.  The following changes were made today:  none  Labs/ tests ordered today include:  No orders of the defined types were placed in this encounter.    Patient Risk:  after full review of this patients clinical status, I feel that they are at moderate risk at this time.  Today, I have spent 12 minutes with the patient with telehealth technology discussing atrial fibrillation, pacemaker.  Signed, Jaynie Hitch Jorja Loa, MD  10/09/2018 12:29 PM     [facility] [address] [place] [address] [phone] (office) [phone] (fax)

## 2018-10-10 ENCOUNTER — Telehealth: Payer: Self-pay

## 2018-10-10 NOTE — Telephone Encounter (Signed)
If no dizziness or lightheadedness, ok to recheck later date. If he is having symptoms, recommend virtual visit. CC to PCP

## 2018-10-10 NOTE — Telephone Encounter (Signed)
RN [name] from [facility] left a vm msg. She stated that pt had a at home nurse visit today for blood pressure check. Reading was 106/50. Wanted to know if provider wanted to intervened or make recommendations regarding low blood pressure reading. Pls advise, thanks.

## 2018-10-11 ENCOUNTER — Encounter: Payer: Self-pay | Admitting: Family Medicine

## 2018-10-11 NOTE — Telephone Encounter (Signed)
Left VM for [name] with update. She also left a VM requesting verbal order for skilled nursing 2x week for the next 2 weeks to help with wound care for a skin tear he has on his arm. On VM, verbal order provided for skilled nursing.

## 2018-10-11 NOTE — Telephone Encounter (Signed)
[name] returned clinic call stating Pt also has dark urine. No other symptoms. Provided verbal for UA and culture.

## 2018-10-13 ENCOUNTER — Encounter: Payer: Self-pay | Admitting: Family Medicine

## 2018-10-13 DIAGNOSIS — M5442 Lumbago with sciatica, left side: Secondary | ICD-10-CM

## 2018-10-13 DIAGNOSIS — G8929 Other chronic pain: Secondary | ICD-10-CM

## 2018-10-16 ENCOUNTER — Other Ambulatory Visit: Payer: Self-pay

## 2018-10-16 ENCOUNTER — Ambulatory Visit (INDEPENDENT_AMBULATORY_CARE_PROVIDER_SITE_OTHER): Payer: Medicare Other | Admitting: *Deleted

## 2018-10-16 DIAGNOSIS — I480 Paroxysmal atrial fibrillation: Secondary | ICD-10-CM

## 2018-10-17 LAB — CUP PACEART REMOTE DEVICE CHECK
Date Time Interrogation Session: 20200512104633
Implantable Lead Implant Date: 20141117
Implantable Lead Implant Date: 20141117
Implantable Lead Location: 753859
Implantable Lead Location: 753860
Implantable Lead Model: 1999
Implantable Pulse Generator Implant Date: 20141117
Pulse Gen Model: 2240
Pulse Gen Serial Number: 7565370

## 2018-10-23 ENCOUNTER — Telehealth: Payer: Self-pay

## 2018-10-23 NOTE — Telephone Encounter (Signed)
Marchelle Folks from [facility] called stating that [facility] did not run a microscopic urine for pt. Urine culture was completed. Does provider want to place another order for UA or is urine culture results sufficient? Pls advise, thanks.

## 2018-10-23 NOTE — Telephone Encounter (Signed)
I don't see any results for urine testing. Can you contact [facility] to have these sent? If the urinalysis showed any microscopic blood or if patient has seen gross blood then I would recommend a urine microscopic. Otherwise, it's not necessary

## 2018-10-24 NOTE — Telephone Encounter (Signed)
Returned call to [facility], spoke with Lanora Manis. Only the culture was ran, no UA. Verbal given to recollect for UA, and if blood was present we would need a microscopic. Verbalized understanding. No further questions.

## 2018-10-24 NOTE — Progress Notes (Signed)
Virtual Visit via Video Note   This visit type was conducted due to national recommendations for restrictions regarding the COVID-19 Pandemic (e.g. social distancing) in an effort to limit this patient's exposure and mitigate transmission in our community.  Due to his co-morbid illnesses, this patient is at least at moderate risk for complications without adequate follow up.  This format is felt to be most appropriate for this patient at this time.  All issues noted in this document were discussed and addressed.  A limited physical exam was performed with this format.  Please refer to the patient's chart for his consent to telehealth for [facility].   Date:  10/25/2018   ID:  Andrew Ill., DOB [birth year], MRN [id number]  Patient Location: Home Provider Location: Home  PCP:  Carlis Stable, PA-C  Cardiologist:  Dr Jens Som Electrophysiologist:  Will Jorja Loa, MD   Evaluation Performed:  Follow-Up Visit  Chief Complaint:  FU Atrial fibrillation  History of Present Illness:    FU atrial fibrillation and pacemaker.  Patient previously resided in .  Patient with history of falls.  CT October 2019 showed large subcutaneous hematoma lateral hip.  Head CT showed left scalp hematoma.  Patient has a history of what sounds to be TAVR.  He also has a history of atrial fibrillation.  His previous cardiologist did not treat with anticoagulation presumably because of recurrent falls.  Echo December 2019 showed normal LV function, mild left ventricular hypertrophy, aortic valve not well visualized, mean gradient 9 mmHg, mild aortic insufficiency, moderate left atrial enlargement, dilated ascending aorta at 4.2 cm.  CT of the lumbar spine performed January 2020 and 3.2 cm abdominal aortic aneurysm noted.  Also with history of syncope.  Since last seen patient has not had recurrent syncope.  However he still falls at times.  He denies dyspnea, chest pain or pedal  edema.  The patient does not have symptoms concerning for COVID-19 infection (fever, chills, cough, or new shortness of breath).    Past Medical History:  Diagnosis Date  . Actinic keratoses   . Aortic stenosis   . Atrial fibrillation (HCC)   . CVA (cerebral vascular accident) (HCC)   . High cholesterol   . History of colon cancer   . History of CVA (cerebrovascular accident)   . Hypertension   . Lower urinary tract symptoms (LUTS)   . Pancreatitis, recurrent    Past Surgical History:  Procedure Laterality Date  . ABDOMINAL SURGERY    . CHOLECYSTECTOMY    . COLON RESECTION    . MOHS SURGERY     Right eyebrow  . PACEMAKER IMPLANT    . VALVE REPLACEMENT       Current Meds  Medication Sig  . AMBULATORY NON FORMULARY MEDICATION Motorized wheelchair. Disp 1 R26.9  . amiodarone (PACERONE) 200 MG tablet TAKE 1 TABLET BY MOUTH EVERY DAY  . aspirin 81 MG tablet Take 81 mg by mouth daily.   . Cholecalciferol (VITAMIN D3 PO) Take 1 tablet by mouth daily.  . clotrimazole-betamethasone (LOTRISONE) cream Apply 1 application topically 2 (two) times daily. To affected skin until rash resolves then continue for 5-7 days after that  . cyanocobalamin (CVS VITAMIN B12) 1000 MCG tablet Take 1,000 mcg by mouth daily.   . DULoxetine (CYMBALTA) 20 MG capsule Take 2 capsules (40 mg total) by mouth daily.  Marland Kitchen ezetimibe (ZETIA) 10 MG tablet TAKE 1 TABLET BY MOUTH EVERY DAY  . famotidine (PEPCID) 20  MG tablet Take 1 tablet (20 mg total) by mouth 2 (two) times daily as needed for heartburn or indigestion.  Marland Kitchen. ketoconazole (NIZORAL) 2 % shampoo   . NONFORMULARY OR COMPOUNDED ITEM Gel donut cushion  . NONFORMULARY OR COMPOUNDED ITEM Gel mattress topper for pressure relief  . tamsulosin (FLOMAX) 0.4 MG CAPS capsule TAKE 1 CAPSULE BY MOUTH EVERY DAY  . valsartan (DIOVAN) 40 MG tablet TAKE 1 TABLET BY MOUTH EVERY DAY IN THE MORNING     Allergies:   Epinephrine; Penicillins; and Vicodin  [hydrocodone-acetaminophen]   Social History   Tobacco Use  . Smoking status: Former Smoker    Packs/day: 1.00    Years: 5.00    Pack years: 5.00    Types: Cigarettes  . Smokeless tobacco: Never Used  Substance Use Topics  . Alcohol use: Yes    Comment: glass wine daily  . Drug use: Never     Family Hx: The patient's family history includes Testicular cancer in his father.  ROS:   Please see the history of present illness.    No fevers, chills or productive cough. All other systems reviewed and are negative.  Recent Labs: 08/14/2018: ALT 18; BUN 13; Creat 1.01; Hemoglobin 14.5; Platelets 220; Potassium 4.3; Sodium 132; TSH 4.32    Wt Readings from Last 3 Encounters:  10/25/18 150 lb (68 kg)  09/04/18 145 lb (65.8 kg)  08/14/18 142 lb (64.4 kg)     Objective:    Vital Signs:  BP (!) 167/65   Pulse 65   Ht 5\' 6"  (1.676 m)   Wt 150 lb (68 kg)   BMI 24.21 kg/m    VITAL SIGNS:  reviewed  No acute distress Answers questions appropriately Normal affect Remainder of physical examination not performed (telehealth visit; coronavirus pandemic)  ASSESSMENT & PLAN:    1. Paroxysmal atrial fibrillation-by history patient remains in sinus rhythm.  Continue amiodarone.  Check chest x-ray.  Recent liver functions and TSH normal.  He has had multiple falls in the past and continues to fall.  We therefore feel risk of anticoagulation outweighs benefit.  Continue aspirin 81 mg daily. 2. Status post TAVR-most recent echocardiogram aortic valve not well visualized but normal mean gradient and mild aortic insufficiency.  Continue SBE prophylaxis. 3. Syncope-etiology unclear.  Seen by Dr. Elberta Fortisamnitz and rate drop was turned on.  No further episodes since that time. 4. Pacemaker-followed by electrophysiology. 5. Hypertension-patient's blood pressure is elevated; increase valsartan to 80 mg daily.  Check potassium and renal function in 1 week. 6. Hyperlipidemia-continue statin. 7.  Abdominal aortic aneurysm-patient will need follow-up ultrasound January 2021.  We will also plan CTA of thoracic aorta for dilated thoracic aorta at that time.  COVID-19 Education: The importance of social distancing was discussed today.  Time:   Today, I have spent 12 minutes with the patient with telehealth technology discussing the above problems.     Medication Adjustments/Labs and Tests Ordered: Current medicines are reviewed at length with the patient today.  Concerns regarding medicines are outlined above.   Tests Ordered: No orders of the defined types were placed in this encounter.   Medication Changes: No orders of the defined types were placed in this encounter.   Disposition:  Follow up in 6 month(s)  Signed, Olga MillersBrian Crenshaw, MD  10/25/2018 1:51 PM    [facility]

## 2018-10-25 ENCOUNTER — Encounter: Payer: Self-pay | Admitting: Cardiology

## 2018-10-25 ENCOUNTER — Telehealth (INDEPENDENT_AMBULATORY_CARE_PROVIDER_SITE_OTHER): Payer: Medicare Other | Admitting: Cardiology

## 2018-10-25 ENCOUNTER — Other Ambulatory Visit: Payer: Self-pay

## 2018-10-25 VITALS — BP 167/65 | HR 65 | Ht 66.0 in | Wt 150.0 lb

## 2018-10-25 DIAGNOSIS — I480 Paroxysmal atrial fibrillation: Secondary | ICD-10-CM | POA: Diagnosis not present

## 2018-10-25 DIAGNOSIS — I714 Abdominal aortic aneurysm, without rupture, unspecified: Secondary | ICD-10-CM

## 2018-10-25 DIAGNOSIS — E7800 Pure hypercholesterolemia, unspecified: Secondary | ICD-10-CM

## 2018-10-25 DIAGNOSIS — I10 Essential (primary) hypertension: Secondary | ICD-10-CM

## 2018-10-25 MED ORDER — VALSARTAN 80 MG PO TABS: 80.0000 mg | ORAL_TABLET | Freq: Every day | ORAL | 3 refills | Status: DC

## 2018-10-25 NOTE — Patient Instructions (Signed)
Medication Instructions:  INCREASE VALSARTAN TO 80 MG ONCE DAILY= 2 OF THE 40 MG TABLETS ONCE DAILY If you need a refill on your cardiac medications before your next appointment, please call your pharmacy.   Lab work: Your physician recommends that you return for lab work in: ONE WEEK If you have labs (blood work) drawn today and your tests are completely normal, you will receive your results only by: Marland Kitchen MyChart Message (if you have MyChart) OR . A paper copy in the mail If you have any lab test that is abnormal or we need to change your treatment, we will call you to review the results.  Testing/Procedures: A chest x-ray takes a picture of the organs and structures inside the chest, including the heart, lungs, and blood vessels. This test can show several things, including, whether the heart is enlarges; whether fluid is building up in the lungs; and whether pacemaker / defibrillator leads are still in place. AT THE [facility]  Follow-Up: Your physician wants you to follow-up in: 6 MONTHS WITH DR Jens Som You will receive a reminder letter in the mail two months in advance. If you don't receive a letter, please call our office to schedule the follow-up appointment.

## 2018-10-25 NOTE — Addendum Note (Signed)
Addended by: Freddi Starr on: 10/25/2018 02:45 PM   Modules accepted: Orders

## 2018-10-26 ENCOUNTER — Encounter: Payer: Self-pay | Admitting: Osteopathic Medicine

## 2018-10-26 NOTE — Progress Notes (Signed)
Remote pacemaker transmission.   

## 2018-11-02 ENCOUNTER — Other Ambulatory Visit: Payer: Self-pay

## 2018-11-02 ENCOUNTER — Ambulatory Visit
Admission: RE | Admit: 2018-11-02 | Discharge: 2018-11-02 | Disposition: A | Discharge status: Home / Self Care | Payer: Medicare Other | Source: Ambulatory Visit | Attending: Family Medicine | Admitting: Family Medicine

## 2018-11-02 ENCOUNTER — Telehealth: Payer: Self-pay

## 2018-11-02 MED ORDER — IOPAMIDOL (ISOVUE-M 200) INJECTION 41%
1.0000 mL | Freq: Once | INTRAMUSCULAR | Status: AC
  Administered 2018-11-02: 1 mL via EPIDURAL

## 2018-11-02 MED ORDER — METHYLPREDNISOLONE ACETATE 40 MG/ML INJ SUSP (RADIOLOG
120.0000 mg | Freq: Once | INTRAMUSCULAR | Status: AC
  Administered 2018-11-02: 120 mg via EPIDURAL

## 2018-11-02 NOTE — Telephone Encounter (Signed)
Called and had to leave msg for [name] to call back since no ID'd VM was not able to leave any pt info. Direct call back info provided

## 2018-11-02 NOTE — Telephone Encounter (Signed)
Okay to give verbal order for skilled nursing  I do not have result in my in-basket, please check fax machines

## 2018-11-02 NOTE — Telephone Encounter (Signed)
Gave mel VO. she states she is re-faxing labs.

## 2018-11-02 NOTE — Telephone Encounter (Signed)
[name], RN with [facility], called to let provider know that they have faxed over the results from pt's U/A and ask that we contact pt with results.   [name] also asking for VO for skilled nursing wound care 2 times a week for 2 weeks.

## 2018-11-02 NOTE — Discharge Instructions (Signed)

## 2018-11-03 ENCOUNTER — Telehealth: Payer: Self-pay | Admitting: Osteopathic Medicine

## 2018-11-03 NOTE — Telephone Encounter (Signed)
Patient's son advised of results. No further needs at this time.

## 2018-11-03 NOTE — Telephone Encounter (Signed)
Please call patient/family: I got urine testing results back from home health.  There is no evidence of urinary tract infection.  If any further concerns, please schedule an office or virtual visit w/ me or Vinetta Bergamo for further discussion.

## 2018-11-04 LAB — BASIC METABOLIC PANEL
BUN: 17 mg/dL (ref 7–25)
CO2: 26 mmol/L (ref 20–32)
Calcium: 9.7 mg/dL (ref 8.6–10.3)
Chloride: 98 mmol/L (ref 98–110)
Creat: 0.92 mg/dL (ref 0.70–1.11)
Glucose, Bld: 129 mg/dL — ABNORMAL HIGH (ref 65–99)
Potassium: 4.4 mmol/L (ref 3.5–5.3)
Sodium: 135 mmol/L (ref 135–146)

## 2018-12-04 ENCOUNTER — Encounter: Payer: Self-pay | Admitting: Family Medicine

## 2018-12-10 ENCOUNTER — Other Ambulatory Visit: Payer: Self-pay

## 2018-12-10 ENCOUNTER — Encounter (HOSPITAL_COMMUNITY): Payer: Self-pay | Admitting: Emergency Medicine

## 2018-12-10 ENCOUNTER — Emergency Department (HOSPITAL_COMMUNITY): Payer: Medicare Other

## 2018-12-10 ENCOUNTER — Inpatient Hospital Stay (HOSPITAL_COMMUNITY): Payer: Medicare Other

## 2018-12-10 ENCOUNTER — Inpatient Hospital Stay (HOSPITAL_COMMUNITY)
Admission: EM | Admit: 2018-12-10 | Discharge: 2018-12-15 | DRG: 025 | Disposition: A | Discharge status: Hospice | Payer: Medicare Other | Attending: Pulmonary Disease | Admitting: Pulmonary Disease

## 2018-12-10 DIAGNOSIS — B179 Acute viral hepatitis, unspecified: Secondary | ICD-10-CM | POA: Diagnosis not present

## 2018-12-10 DIAGNOSIS — S0101XA Laceration without foreign body of scalp, initial encounter: Secondary | ICD-10-CM | POA: Diagnosis present

## 2018-12-10 DIAGNOSIS — R64 Cachexia: Secondary | ICD-10-CM | POA: Diagnosis present

## 2018-12-10 DIAGNOSIS — J9600 Acute respiratory failure, unspecified whether with hypoxia or hypercapnia: Secondary | ICD-10-CM | POA: Diagnosis not present

## 2018-12-10 DIAGNOSIS — R569 Unspecified convulsions: Secondary | ICD-10-CM | POA: Diagnosis not present

## 2018-12-10 DIAGNOSIS — R413 Other amnesia: Secondary | ICD-10-CM | POA: Diagnosis present

## 2018-12-10 DIAGNOSIS — I160 Hypertensive urgency: Secondary | ICD-10-CM | POA: Diagnosis present

## 2018-12-10 DIAGNOSIS — G9341 Metabolic encephalopathy: Secondary | ICD-10-CM | POA: Diagnosis not present

## 2018-12-10 DIAGNOSIS — R296 Repeated falls: Secondary | ICD-10-CM | POA: Diagnosis present

## 2018-12-10 DIAGNOSIS — R253 Fasciculation: Secondary | ICD-10-CM | POA: Diagnosis not present

## 2018-12-10 DIAGNOSIS — L89309 Pressure ulcer of unspecified buttock, unspecified stage: Secondary | ICD-10-CM | POA: Diagnosis present

## 2018-12-10 DIAGNOSIS — R0689 Other abnormalities of breathing: Secondary | ICD-10-CM | POA: Diagnosis not present

## 2018-12-10 DIAGNOSIS — E785 Hyperlipidemia, unspecified: Secondary | ICD-10-CM | POA: Diagnosis present

## 2018-12-10 DIAGNOSIS — Z4659 Encounter for fitting and adjustment of other gastrointestinal appliance and device: Secondary | ICD-10-CM

## 2018-12-10 DIAGNOSIS — S066X9A Traumatic subarachnoid hemorrhage with loss of consciousness of unspecified duration, initial encounter: Secondary | ICD-10-CM | POA: Diagnosis present

## 2018-12-10 DIAGNOSIS — W19XXXA Unspecified fall, initial encounter: Secondary | ICD-10-CM | POA: Diagnosis present

## 2018-12-10 DIAGNOSIS — I959 Hypotension, unspecified: Secondary | ICD-10-CM | POA: Diagnosis not present

## 2018-12-10 DIAGNOSIS — K8020 Calculus of gallbladder without cholecystitis without obstruction: Secondary | ICD-10-CM | POA: Diagnosis present

## 2018-12-10 DIAGNOSIS — R945 Abnormal results of liver function studies: Secondary | ICD-10-CM | POA: Diagnosis not present

## 2018-12-10 DIAGNOSIS — Z66 Do not resuscitate: Secondary | ICD-10-CM | POA: Diagnosis not present

## 2018-12-10 DIAGNOSIS — Z7189 Other specified counseling: Secondary | ICD-10-CM

## 2018-12-10 DIAGNOSIS — Z87891 Personal history of nicotine dependence: Secondary | ICD-10-CM

## 2018-12-10 DIAGNOSIS — Z87440 Personal history of urinary (tract) infections: Secondary | ICD-10-CM

## 2018-12-10 DIAGNOSIS — S065XAA Traumatic subdural hemorrhage with loss of consciousness status unknown, initial encounter: Secondary | ICD-10-CM | POA: Diagnosis present

## 2018-12-10 DIAGNOSIS — M5030 Other cervical disc degeneration, unspecified cervical region: Secondary | ICD-10-CM | POA: Diagnosis present

## 2018-12-10 DIAGNOSIS — F32A Depression, unspecified: Secondary | ICD-10-CM | POA: Diagnosis present

## 2018-12-10 DIAGNOSIS — I480 Paroxysmal atrial fibrillation: Secondary | ICD-10-CM | POA: Diagnosis present

## 2018-12-10 DIAGNOSIS — R740 Nonspecific elevation of levels of transaminase and lactic acid dehydrogenase [LDH]: Secondary | ICD-10-CM | POA: Diagnosis not present

## 2018-12-10 DIAGNOSIS — F418 Other specified anxiety disorders: Secondary | ICD-10-CM | POA: Diagnosis present

## 2018-12-10 DIAGNOSIS — K859 Acute pancreatitis without necrosis or infection, unspecified: Secondary | ICD-10-CM | POA: Diagnosis present

## 2018-12-10 DIAGNOSIS — K8051 Calculus of bile duct without cholangitis or cholecystitis with obstruction: Secondary | ICD-10-CM | POA: Diagnosis present

## 2018-12-10 DIAGNOSIS — Z7982 Long term (current) use of aspirin: Secondary | ICD-10-CM

## 2018-12-10 DIAGNOSIS — K861 Other chronic pancreatitis: Secondary | ICD-10-CM | POA: Diagnosis present

## 2018-12-10 DIAGNOSIS — I4891 Unspecified atrial fibrillation: Secondary | ICD-10-CM | POA: Diagnosis not present

## 2018-12-10 DIAGNOSIS — Y92018 Other place in single-family (private) house as the place of occurrence of the external cause: Secondary | ICD-10-CM

## 2018-12-10 DIAGNOSIS — Z515 Encounter for palliative care: Secondary | ICD-10-CM

## 2018-12-10 DIAGNOSIS — IMO0002 Reserved for concepts with insufficient information to code with codable children: Secondary | ICD-10-CM | POA: Diagnosis present

## 2018-12-10 DIAGNOSIS — Z950 Presence of cardiac pacemaker: Secondary | ICD-10-CM

## 2018-12-10 DIAGNOSIS — S065X9A Traumatic subdural hemorrhage with loss of consciousness of unspecified duration, initial encounter: Secondary | ICD-10-CM | POA: Diagnosis present

## 2018-12-10 DIAGNOSIS — Z880 Allergy status to penicillin: Secondary | ICD-10-CM

## 2018-12-10 DIAGNOSIS — I350 Nonrheumatic aortic (valve) stenosis: Secondary | ICD-10-CM | POA: Diagnosis present

## 2018-12-10 DIAGNOSIS — Y92009 Unspecified place in unspecified non-institutional (private) residence as the place of occurrence of the external cause: Secondary | ICD-10-CM | POA: Diagnosis not present

## 2018-12-10 DIAGNOSIS — Z1159 Encounter for screening for other viral diseases: Secondary | ICD-10-CM

## 2018-12-10 DIAGNOSIS — R29810 Facial weakness: Secondary | ICD-10-CM | POA: Diagnosis not present

## 2018-12-10 DIAGNOSIS — E7800 Pure hypercholesterolemia, unspecified: Secondary | ICD-10-CM | POA: Diagnosis present

## 2018-12-10 DIAGNOSIS — Z8673 Personal history of transient ischemic attack (TIA), and cerebral infarction without residual deficits: Secondary | ICD-10-CM

## 2018-12-10 DIAGNOSIS — J9601 Acute respiratory failure with hypoxia: Secondary | ICD-10-CM | POA: Diagnosis not present

## 2018-12-10 DIAGNOSIS — Z8043 Family history of malignant neoplasm of testis: Secondary | ICD-10-CM

## 2018-12-10 DIAGNOSIS — F329 Major depressive disorder, single episode, unspecified: Secondary | ICD-10-CM | POA: Diagnosis present

## 2018-12-10 DIAGNOSIS — I10 Essential (primary) hypertension: Secondary | ICD-10-CM | POA: Diagnosis present

## 2018-12-10 DIAGNOSIS — R748 Abnormal levels of other serum enzymes: Secondary | ICD-10-CM

## 2018-12-10 DIAGNOSIS — Z952 Presence of prosthetic heart valve: Secondary | ICD-10-CM

## 2018-12-10 DIAGNOSIS — Z9049 Acquired absence of other specified parts of digestive tract: Secondary | ICD-10-CM

## 2018-12-10 DIAGNOSIS — Z85038 Personal history of other malignant neoplasm of large intestine: Secondary | ICD-10-CM

## 2018-12-10 DIAGNOSIS — I77811 Abdominal aortic ectasia: Secondary | ICD-10-CM | POA: Diagnosis present

## 2018-12-10 DIAGNOSIS — I4430 Unspecified atrioventricular block: Secondary | ICD-10-CM | POA: Diagnosis present

## 2018-12-10 DIAGNOSIS — Z885 Allergy status to narcotic agent status: Secondary | ICD-10-CM

## 2018-12-10 DIAGNOSIS — Z978 Presence of other specified devices: Secondary | ICD-10-CM

## 2018-12-10 DIAGNOSIS — R7401 Elevation of levels of liver transaminase levels: Secondary | ICD-10-CM

## 2018-12-10 LAB — CBC WITH DIFFERENTIAL/PLATELET
Abs Immature Granulocytes: 0.25 10*3/uL — ABNORMAL HIGH (ref 0.00–0.07)
Basophils Absolute: 0 10*3/uL (ref 0.0–0.1)
Basophils Relative: 0 %
Eosinophils Absolute: 0 10*3/uL (ref 0.0–0.5)
Eosinophils Relative: 0 %
HCT: 41.6 % (ref 39.0–52.0)
Hemoglobin: 13.7 g/dL (ref 13.0–17.0)
Immature Granulocytes: 3 %
Lymphocytes Relative: 3 %
Lymphs Abs: 0.3 10*3/uL — ABNORMAL LOW (ref 0.7–4.0)
MCH: 33.3 pg (ref 26.0–34.0)
MCHC: 32.9 g/dL (ref 30.0–36.0)
MCV: 101.2 fL — ABNORMAL HIGH (ref 80.0–100.0)
Monocytes Absolute: 0.6 10*3/uL (ref 0.1–1.0)
Monocytes Relative: 6 %
Neutro Abs: 8.6 10*3/uL — ABNORMAL HIGH (ref 1.7–7.7)
Neutrophils Relative %: 88 %
Platelets: 232 10*3/uL (ref 150–400)
RBC: 4.11 MIL/uL — ABNORMAL LOW (ref 4.22–5.81)
RDW: 14.7 % (ref 11.5–15.5)
WBC: 9.8 10*3/uL (ref 4.0–10.5)
nRBC: 0 % (ref 0.0–0.2)

## 2018-12-10 LAB — URINALYSIS, ROUTINE W REFLEX MICROSCOPIC
Bacteria, UA: NONE SEEN
Bilirubin Urine: NEGATIVE
Glucose, UA: NEGATIVE mg/dL
Hgb urine dipstick: NEGATIVE
Ketones, ur: NEGATIVE mg/dL
Leukocytes,Ua: NEGATIVE
Nitrite: NEGATIVE
Protein, ur: 30 mg/dL — AB
Specific Gravity, Urine: 1.018 (ref 1.005–1.030)
pH: 6 (ref 5.0–8.0)

## 2018-12-10 LAB — COMPREHENSIVE METABOLIC PANEL
ALT: 154 U/L — ABNORMAL HIGH (ref 0–44)
AST: 98 U/L — ABNORMAL HIGH (ref 15–41)
Albumin: 3.1 g/dL — ABNORMAL LOW (ref 3.5–5.0)
Alkaline Phosphatase: 701 U/L — ABNORMAL HIGH (ref 38–126)
Anion gap: 9 (ref 5–15)
BUN: 14 mg/dL (ref 8–23)
CO2: 23 mmol/L (ref 22–32)
Calcium: 9 mg/dL (ref 8.9–10.3)
Chloride: 100 mmol/L (ref 98–111)
Creatinine, Ser: 0.7 mg/dL (ref 0.61–1.24)
GFR calc Af Amer: >60 mL/min (ref >60)
GFR calc non Af Amer: >60 mL/min (ref >60)
Glucose, Bld: 143 mg/dL — ABNORMAL HIGH (ref 70–99)
Potassium: 4.2 mmol/L (ref 3.5–5.1)
Sodium: 132 mmol/L — ABNORMAL LOW (ref 135–145)
Total Bilirubin: 2.4 mg/dL — ABNORMAL HIGH (ref 0.3–1.2)
Total Protein: 5.8 g/dL — ABNORMAL LOW (ref 6.5–8.1)

## 2018-12-10 LAB — CBG MONITORING, ED: Glucose-Capillary: 136 mg/dL — ABNORMAL HIGH (ref 70–99)

## 2018-12-10 LAB — SARS CORONAVIRUS 2 BY RT PCR (HOSPITAL ORDER, PERFORMED IN ~~LOC~~ HOSPITAL LAB): SARS Coronavirus 2: NEGATIVE

## 2018-12-10 LAB — BRAIN NATRIURETIC PEPTIDE: B Natriuretic Peptide: 1078.9 pg/mL — ABNORMAL HIGH (ref 0.0–100.0)

## 2018-12-10 MED ORDER — ONDANSETRON HCL 4 MG PO TABS: 4.0000 mg | ORAL_TABLET | Freq: Four times a day (QID) | ORAL | Status: DC | PRN

## 2018-12-10 MED ORDER — ONDANSETRON HCL 4 MG/2ML IJ SOLN: 4.0000 mg | Freq: Four times a day (QID) | INTRAMUSCULAR | Status: DC | PRN

## 2018-12-10 MED ORDER — LABETALOL HCL 5 MG/ML IV SOLN
10.0000 mg | Freq: Once | INTRAVENOUS | Status: AC
  Administered 2018-12-10: 22:00:00 10 mg via INTRAVENOUS

## 2018-12-10 MED ORDER — IOHEXOL 300 MG/ML  SOLN
100.0000 mL | Freq: Once | INTRAMUSCULAR | Status: AC | PRN
  Administered 2018-12-10: 17:00:00 100 mL via INTRAVENOUS

## 2018-12-10 MED ORDER — LABETALOL HCL 5 MG/ML IV SOLN
10.0000 mg | Freq: Once | INTRAVENOUS | Status: AC
  Administered 2018-12-10: 19:00:00 10 mg via INTRAVENOUS
  Filled 2018-12-10: qty 4

## 2018-12-10 MED ORDER — DEXTROSE-NACL 5-0.45 % IV SOLN
INTRAVENOUS | Status: DC
  Administered 2018-12-10: via INTRAVENOUS

## 2018-12-10 MED ORDER — ONDANSETRON HCL 4 MG/2ML IJ SOLN
4.0000 mg | Freq: Once | INTRAMUSCULAR | Status: AC
  Administered 2018-12-10: 4 mg via INTRAVENOUS
  Filled 2018-12-10: qty 2

## 2018-12-10 NOTE — H&P (Signed)
History and Physical   Andrew Ill. ZOX:[id number] DOB: 20-Jul-1931 DOA: 12/10/2018  Referring MD/NP/PA: Dr. Madilyn Hook  PCP: Carlis Stable, [place]   Outpatient Specialists: None  Patient coming from: Home  Chief Complaint: Fall  HPI: Andrew Hughlett. is a 85 y.o. male with medical history significant of CVA, atrial fibrillation, aortic stenosis, hyperlipidemia, colon cancer, hypertension, recurrent gallstone pancreatitis, actinic keratosis and recurrent UTIs who came into the ER after a fall.  Patient has had a recurrent falls apparently.  He was found in the closet with evidence of head trauma.  Patient came to the ER by EMS and was evaluated.  He was initially confused but currently fully awake and communicating.  Patient was found to have subdural hematoma.  Patient also noted to have transaminitis with complaint of some abdominal pain but no nausea or vomiting.  He also has what appears to be possible common bile duct stones.  He has had history of gallstones in the past with recurrent pancreatitis.  No evidence of pancreatitis at this point.  Patient has been evaluated by neurosurgery as well as CCM.  He is stable enough to go to progressive care instead of ICU and we will be admitting him as such..  ED Course: Temperature is 97.9 blood pressure is 213/65 pulse 79 respiratory rate of 26 oxygen sat 78% on room air but 100% on 2 L.  White count is 9.8, hemoglobin 13.7 and platelets 232.  Sodium is 132 potassium 4.2 glucose 143 creatinine 0.70.  Albumin 3.1.  AST of 98 ALT 154 total protein 5.8 and total bilirubin 2.4.  BNP 1078.  Urinalysis essentially negative.  CT head and cervical spine showed multilevel degenerative disc disease.  There was a large subdural hematoma overlying the right cerebral cortex and right tentorial region.  There was 5 mm of right to left midline shift.  Also maximal thickness of 15 mm.  Another focus of subarachnoid hemorrhage is noted in the right  parietal cortex as well as a left subdural hematoma overlying left parietal cortex.  Patient has been evaluated by neurosurgery.  No immediate need for surgical intervention and he will be admitted for management.  Review of Systems: As per HPI otherwise 10 point review of systems negative.    Past Medical History:  Diagnosis Date   Actinic keratoses    Aortic stenosis    Atrial fibrillation (HCC)    CVA (cerebral vascular accident) (HCC)    High cholesterol    History of colon cancer    History of CVA (cerebrovascular accident)    Hypertension    Lower urinary tract symptoms (LUTS)    Pancreatitis, recurrent     Past Surgical History:  Procedure Laterality Date   ABDOMINAL SURGERY     CHOLECYSTECTOMY     COLON RESECTION     MOHS SURGERY     Right eyebrow   PACEMAKER IMPLANT     VALVE REPLACEMENT       reports that he has quit smoking. His smoking use included cigarettes. He has a 5.00 pack-year smoking history. He has never used smokeless tobacco. He reports current alcohol use. He reports that he does not use drugs.  Allergies  Allergen Reactions   Epinephrine Other (See Comments)    Dizziness/double vision   Penicillins Rash   Vicodin [Hydrocodone-Acetaminophen] Nausea Only    Mood changes, Fatigue    Family History  Problem Relation Age of Onset   Testicular cancer Father  Prior to Admission medications   Medication Sig Start Date End Date Taking? Authorizing Provider  AMBULATORY NON FORMULARY MEDICATION Motorized wheelchair. Disp 1 R26.9 08/22/18   Rodolph Bongorey, Evan S, MD  amiodarone (PACERONE) 200 MG tablet TAKE 1 TABLET BY MOUTH EVERY DAY 10/05/18   Carlis Stableummings, Charley Elizabeth, PA-C  aspirin 81 MG tablet Take 81 mg by mouth daily.     [provider]  Cholecalciferol (VITAMIN D3 PO) Take 1 tablet by mouth daily.    [provider]  clotrimazole-betamethasone (LOTRISONE) cream Apply 1 application topically 2 (two) times  daily. To affected skin until rash resolves then continue for 5-7 days after that 08/08/18   Sunnie NielsenAlexander, Natalie, DO  cyanocobalamin ([facility] VITAMIN B12) 1000 MCG tablet Take 1,000 mcg by mouth daily.     [provider]  DULoxetine (CYMBALTA) 20 MG capsule Take 2 capsules (40 mg total) by mouth daily. 08/15/18   Rodolph Bongorey, Evan S, MD  ezetimibe (ZETIA) 10 MG tablet TAKE 1 TABLET BY MOUTH EVERY DAY 10/09/18   Carlis Stableummings, Charley Elizabeth, PA-C  famotidine (PEPCID) 20 MG tablet Take 1 tablet (20 mg total) by mouth 2 (two) times daily as needed for heartburn or indigestion. 09/04/18   Carlis Stableummings, Charley Elizabeth, PA-C  ketoconazole (NIZORAL) 2 % shampoo  06/10/18   [provider]  NONFORMULARY OR COMPOUNDED ITEM Gel donut cushion 09/04/18   Carlis Stableummings, Charley Elizabeth, [place]  NONFORMULARY OR COMPOUNDED ITEM Gel mattress topper for pressure relief 09/04/18   Carlis Stableummings, Charley Elizabeth, PA-C  tamsulosin (FLOMAX) 0.4 MG CAPS capsule TAKE 1 CAPSULE BY MOUTH EVERY DAY 07/17/18   Carlis Stableummings, Charley Elizabeth, PA-C  valsartan (DIOVAN) 80 MG tablet Take 1 tablet (80 mg total) by mouth daily. 10/25/18   Lewayne Buntingrenshaw, Brian S, MD    Physical Exam: Vitals:   12/10/18 2100 12/10/18 2256 12/10/18 2300 12/10/18 2357  BP: (!) 192/61 (!) 192/69 (!) 202/60 (!) 190/49  Pulse: 63  62 (!) 57  Resp: (!) 25  18 17   Temp:   97.9 F (36.6 C)   TempSrc:   Axillary   SpO2: 94%  97% 96%  Weight:    63.9 kg  Height:    5\' 6"  (1.676 m)      Constitutional: Acutely ill looking, mildly agitated Vitals:   12/10/18 2100 12/10/18 2256 12/10/18 2300 12/10/18 2357  BP: (!) 192/61 (!) 192/69 (!) 202/60 (!) 190/49  Pulse: 63  62 (!) 57  Resp: (!) 25  18 17   Temp:   97.9 F (36.6 C)   TempSrc:   Axillary   SpO2: 94%  97% 96%  Weight:    63.9 kg  Height:    5\' 6"  (1.676 m)   Eyes: PERRL, lids and conjunctivae normal ENMT: Mucous membranes are moist. Posterior pharynx clear of any exudate or lesions.Normal dentition.    Neck: normal, supple, no masses, no thyromegaly Respiratory: clear to auscultation bilaterally, no wheezing, no crackles. Normal respiratory effort. No accessory muscle use.  Cardiovascular: Tachycardic no murmurs / rubs / gallops. No extremity edema. 2+ pedal pulses. No carotid bruits.  Abdomen: no tenderness, no masses palpated. No hepatosplenomegaly. Bowel sounds positive.  Musculoskeletal: no clubbing / cyanosis. No joint deformity upper and lower extremities. Good ROM, no contractures. Normal muscle tone.  Skin: Mild rashes in the face, lesions, ulcers. No induration Neurologic: CN 2-12 grossly intact. Sensation intact, DTR normal. Strength 5/5 in all 4.  Psychiatric: Fully awake and communicating, mild anxiety with irritability    Labs on  Admission: I have personally reviewed following labs and imaging studies  CBC: Recent Labs  Lab 12/10/18 1425  WBC 9.8  NEUTROABS 8.6*  HGB 13.7  HCT 41.6  MCV 101.2*  PLT 637   Basic Metabolic Panel: Recent Labs  Lab 12/10/18 1425  NA 132*  K 4.2  CL 100  CO2 23  GLUCOSE 143*  BUN 14  CREATININE 0.70  CALCIUM 9.0   GFR: Estimated Creatinine Clearance: 60.9 mL/min (by C-G formula based on SCr of 0.7 mg/dL). Liver Function Tests: Recent Labs  Lab 12/10/18 1425  AST 98*  ALT 154*  ALKPHOS 701*  BILITOT 2.4*  PROT 5.8*  ALBUMIN 3.1*   No results for input(s): LIPASE, AMYLASE in the last 168 hours. No results for input(s): AMMONIA in the last 168 hours. Coagulation Profile: No results for input(s): INR, PROTIME in the last 168 hours. Cardiac Enzymes: No results for input(s): CKTOTAL, CKMB, CKMBINDEX, TROPONINI in the last 168 hours. BNP (last 3 results) No results for input(s): PROBNP in the last 8760 hours. HbA1C: No results for input(s): HGBA1C in the last 72 hours. CBG: Recent Labs  Lab 12/10/18 1322  GLUCAP 136*   Lipid Profile: No results for input(s): CHOL, HDL, LDLCALC, TRIG, CHOLHDL, LDLDIRECT in the  last 72 hours. Thyroid Function Tests: No results for input(s): TSH, T4TOTAL, FREET4, T3FREE, THYROIDAB in the last 72 hours. Anemia Panel: No results for input(s): VITAMINB12, FOLATE, FERRITIN, TIBC, IRON, RETICCTPCT in the last 72 hours. Urine analysis:    Component Value Date/Time   COLORURINE YELLOW 12/10/2018 [place] 12/10/2018 1804   LABSPEC 1.018 12/10/2018 1804   PHURINE 6.0 12/10/2018 1804   GLUCOSEU NEGATIVE 12/10/2018 1804   HGBUR NEGATIVE 12/10/2018 1804   BILIRUBINUR NEGATIVE 12/10/2018 1804   KETONESUR NEGATIVE 12/10/2018 1804   PROTEINUR 30 (A) 12/10/2018 1804   NITRITE NEGATIVE 12/10/2018 1804   LEUKOCYTESUR NEGATIVE 12/10/2018 1804   Sepsis Labs: @LABRCNTIP (procalcitonin:4,lacticidven:4) ) Recent Results (from the past 240 hour(s))  SARS Coronavirus 2 (CEPHEID - Performed in [place] [facility] hospital lab), Hosp Order     Status: None   Collection Time: 12/10/18  3:51 PM   Specimen: Nasopharyngeal Swab  Result Value Ref Range Status   SARS Coronavirus 2 NEGATIVE NEGATIVE Final    Comment: (NOTE) If result is NEGATIVE SARS-CoV-2 target nucleic acids are NOT DETECTED. The SARS-CoV-2 RNA is generally detectable in upper and lower  respiratory specimens during the acute phase of infection. The lowest  concentration of SARS-CoV-2 viral copies this assay can detect is 250  copies / mL. A negative result does not preclude SARS-CoV-2 infection  and should not be used as the sole basis for treatment or other  patient management decisions.  A negative result may occur with  improper specimen collection / handling, submission of specimen other  than nasopharyngeal swab, presence of viral mutation(s) within the  areas targeted by this assay, and inadequate number of viral copies  (<250 copies / mL). A negative result must be combined with clinical  observations, patient history, and epidemiological information. If result is POSITIVE SARS-CoV-2 target  nucleic acids are DETECTED. The SARS-CoV-2 RNA is generally detectable in upper and lower  respiratory specimens dur ing the acute phase of infection.  Positive  results are indicative of active infection with SARS-CoV-2.  Clinical  correlation with patient history and other diagnostic information is  necessary to determine patient infection status.  Positive results do  not rule out bacterial infection or  co-infection with other viruses. If result is PRESUMPTIVE POSTIVE SARS-CoV-2 nucleic acids MAY BE PRESENT.   A presumptive positive result was obtained on the submitted specimen  and confirmed on repeat testing.  While 2019 novel coronavirus  (SARS-CoV-2) nucleic acids may be present in the submitted sample  additional confirmatory testing may be necessary for epidemiological  and / or clinical management purposes  to differentiate between  SARS-CoV-2 and other Sarbecovirus currently known to infect humans.  If clinically indicated additional testing with an alternate test  methodology [phone]) is advised. The SARS-CoV-2 RNA is generally  detectable in upper and lower respiratory sp ecimens during the acute  phase of infection. The expected result is Negative. Fact Sheet for Patients:  BoilerBrush.com.cy Fact Sheet for Healthcare Providers: https://pope.com/ This test is not yet approved or cleared by the [place] FDA and has been authorized for detection and/or diagnosis of SARS-CoV-2 by FDA under an Emergency Use Authorization (EUA).  This EUA will remain in effect (meaning this test can be used) for the duration of the COVID-19 declaration under Section 564(b)(1) of the Act, 21 U.S.C. section 360bbb-3(b)(1), unless the authorization is terminated or revoked sooner. Performed at [facility], [address], [place], [place] [address]      Radiological Exams on Admission: Ct Head Wo Contrast  Result Date:  12/10/2018 CLINICAL DATA:  Posttraumatic headache after fall. EXAM: CT HEAD WITHOUT CONTRAST CT CERVICAL SPINE WITHOUT CONTRAST TECHNIQUE: Multidetector CT imaging of the head and cervical spine was performed following the standard protocol without intravenous contrast. Multiplanar CT image reconstructions of the cervical spine were also generated. COMPARISON:  CT scan of March 27, 2018. FINDINGS: CT HEAD FINDINGS Brain: Ventricular size is within normal limits. Mild chronic ischemic white matter disease is noted. Mild diffuse cortical atrophy is noted. Large subdural hematoma is seen overlying the right cerebral cortex and right tentorial region. Subdural hemorrhage is also noted in the right parafalcine region. This results in 5 mm of right to left midline shift. Maximum thickness of 15 mm is noted. Focus of subarachnoid hemorrhage is noted in right parietal cortex. Small left subdural hematoma is seen overlying left parietal cortex. Vascular: No hyperdense vessel or unexpected calcification. Skull: Normal. Negative for fracture or focal lesion. Sinuses/Orbits: No acute finding. Other: None. CT CERVICAL SPINE FINDINGS Alignment: Grade 1 retrolisthesis of C5-6 is noted secondary to severe degenerative disc disease at this level. Grade 1 anterolisthesis of C7-T1 is noted secondary to posterior facet joint hypertrophy. Skull base and vertebrae: No acute fracture. No primary bone lesion or focal pathologic process. Soft tissues and spinal canal: No prevertebral fluid or swelling. No visible canal hematoma. Disc levels: Severe degenerative disc disease is noted at C3-4, C4-5, C5-6, C6-7 and C7-T1. Upper chest: Negative. Other: Degenerative changes are seen involving posterior facet joints bilaterally. IMPRESSION: Large subdural hematoma is seen overlying right cerebral cortex and right tentorial region, resulting in 5 mm of right to left midline shift. Demonstrates a maximum thickness of 15 mm. Focus of subarachnoid  hemorrhage is noted in the right parietal cortex. Small left subdural hematoma is seen overlying left parietal cortex. Critical Value/emergent results were called by telephone at the time of interpretation on 12/10/2018 at 3:45 pm to Dr. Tilden Fossa , who verbally acknowledged these results. Severe multilevel degenerative disc disease is noted in the cervical spine. No fracture or acute abnormality is noted. Electronically Signed   By: Lupita Raider M.D.   On: 12/10/2018 15:46  Ct Chest W Contrast  Result Date: 12/10/2018 CLINICAL DATA:  Abdominal trauma.  Vomiting.  Head bleed. EXAM: CT CHEST, ABDOMEN, AND PELVIS WITH CONTRAST TECHNIQUE: Multidetector CT imaging of the chest, abdomen and pelvis was performed following the standard protocol during bolus administration of intravenous contrast. CONTRAST:  OMNIPAQUE IOHEXOL 300 MG/ML  SOLN COMPARISON:  Chest radiograph of earlier in the day.  No prior CTs. FINDINGS: CT CHEST FINDINGS Cardiovascular: Pacer. Aortic valve repair. Aortic and branch vessel atherosclerosis. Mild cardiomegaly, without pericardial effusion. Multivessel coronary artery atherosclerosis. No aortic laceration or mediastinal hematoma. Mediastinum/Nodes: No mediastinal or hilar adenopathy. Lungs/Pleura: No pleural fluid. Minimal left pleural thickening. No pneumothorax. No pulmonary contusion or consolidation. Subpleural interstitial prominence is suspicious for mild interstitial lung disease. Musculoskeletal: Degenerate changes of both glenohumeral joints, worse on the right. CT ABDOMEN PELVIS FINDINGS Hepatobiliary: No focal liver lesion. Cholecystectomy. Mild intrahepatic biliary duct dilatation. The common duct measures 1.4 cm in the porta hepatis on coronal image 54. A distal common duct stent originates at the level of the pancreatic head and terminates at the inferior genu of the duodenum. Pancreas: Normal pancreas, without duct dilatation or acute inflammation. Spleen: Normal in  size, without focal abnormality. Adrenals/Urinary Tract: Normal right adrenal gland. Mild left adrenal thickening. Bilateral too small to characterize renal lesions. Left renal upper pole cysts. Mild bladder distension. Stomach/Bowel: Normal stomach, without wall thickening. Surgical sutures within the rectum. Normal terminal ileum. Normal small bowel. Vascular/Lymphatic: Advanced aortic and branch vessel atherosclerosis. Infrarenal abdominal aortic ectasia at 3.1 cm. Distal aortic/left common iliac artery stent graft. No abdominopelvic adenopathy. Reproductive: Mild prostatomegaly. Other: No significant free fluid. Musculoskeletal: Mild osteopenia. Presumed bone island in the right iliac. Lumbosacral spondylosis. IMPRESSION: 1. No acute or posttraumatic deformity identified. 2. Stent within the distal common duct, terminating at the inferior genu of the duodenum. Intra and extrahepatic biliary duct dilatation above the stent. Correlate with bilirubin levels. The stent may need to be repositioned or replaced to traverse the presumed area of stricture. 3. Coronary artery atherosclerosis. Aortic Atherosclerosis (ICD10-I70.0). 4. Infrarenal abdominal aortic ectasia at 3.1 cm. Recommend followup by ultrasound in 3 years. This recommendation follows ACR consensus guidelines: White Paper of the ACR Incidental Findings Committee II on Vascular Findings. J Am Coll Radiol 2013; 10:789-794. Aortic aneurysm NOS (ICD10-I71.9). Electronically Signed   By: Jeronimo Greaves M.D.   On: 12/10/2018 18:16   Ct Cervical Spine Wo Contrast  Result Date: 12/10/2018 CLINICAL DATA:  Posttraumatic headache after fall. EXAM: CT HEAD WITHOUT CONTRAST CT CERVICAL SPINE WITHOUT CONTRAST TECHNIQUE: Multidetector CT imaging of the head and cervical spine was performed following the standard protocol without intravenous contrast. Multiplanar CT image reconstructions of the cervical spine were also generated. COMPARISON:  CT scan of March 27, 2018.  FINDINGS: CT HEAD FINDINGS Brain: Ventricular size is within normal limits. Mild chronic ischemic white matter disease is noted. Mild diffuse cortical atrophy is noted. Large subdural hematoma is seen overlying the right cerebral cortex and right tentorial region. Subdural hemorrhage is also noted in the right parafalcine region. This results in 5 mm of right to left midline shift. Maximum thickness of 15 mm is noted. Focus of subarachnoid hemorrhage is noted in right parietal cortex. Small left subdural hematoma is seen overlying left parietal cortex. Vascular: No hyperdense vessel or unexpected calcification. Skull: Normal. Negative for fracture or focal lesion. Sinuses/Orbits: No acute finding. Other: None. CT CERVICAL SPINE FINDINGS Alignment: Grade 1 retrolisthesis of C5-6 is noted secondary to  severe degenerative disc disease at this level. Grade 1 anterolisthesis of C7-T1 is noted secondary to posterior facet joint hypertrophy. Skull base and vertebrae: No acute fracture. No primary bone lesion or focal pathologic process. Soft tissues and spinal canal: No prevertebral fluid or swelling. No visible canal hematoma. Disc levels: Severe degenerative disc disease is noted at C3-4, C4-5, C5-6, C6-7 and C7-T1. Upper chest: Negative. Other: Degenerative changes are seen involving posterior facet joints bilaterally. IMPRESSION: Large subdural hematoma is seen overlying right cerebral cortex and right tentorial region, resulting in 5 mm of right to left midline shift. Demonstrates a maximum thickness of 15 mm. Focus of subarachnoid hemorrhage is noted in the right parietal cortex. Small left subdural hematoma is seen overlying left parietal cortex. Critical Value/emergent results were called by telephone at the time of interpretation on 12/10/2018 at 3:45 pm to Dr. Tilden Fossa , who verbally acknowledged these results. Severe multilevel degenerative disc disease is noted in the cervical spine. No fracture or acute  abnormality is noted. Electronically Signed   By: Lupita Raider M.D.   On: 12/10/2018 15:46   Ct Abdomen Pelvis W Contrast  Result Date: 12/10/2018 CLINICAL DATA:  Abdominal trauma.  Vomiting.  Head bleed. EXAM: CT CHEST, ABDOMEN, AND PELVIS WITH CONTRAST TECHNIQUE: Multidetector CT imaging of the chest, abdomen and pelvis was performed following the standard protocol during bolus administration of intravenous contrast. CONTRAST:  OMNIPAQUE IOHEXOL 300 MG/ML  SOLN COMPARISON:  Chest radiograph of earlier in the day.  No prior CTs. FINDINGS: CT CHEST FINDINGS Cardiovascular: Pacer. Aortic valve repair. Aortic and branch vessel atherosclerosis. Mild cardiomegaly, without pericardial effusion. Multivessel coronary artery atherosclerosis. No aortic laceration or mediastinal hematoma. Mediastinum/Nodes: No mediastinal or hilar adenopathy. Lungs/Pleura: No pleural fluid. Minimal left pleural thickening. No pneumothorax. No pulmonary contusion or consolidation. Subpleural interstitial prominence is suspicious for mild interstitial lung disease. Musculoskeletal: Degenerate changes of both glenohumeral joints, worse on the right. CT ABDOMEN PELVIS FINDINGS Hepatobiliary: No focal liver lesion. Cholecystectomy. Mild intrahepatic biliary duct dilatation. The common duct measures 1.4 cm in the porta hepatis on coronal image 54. A distal common duct stent originates at the level of the pancreatic head and terminates at the inferior genu of the duodenum. Pancreas: Normal pancreas, without duct dilatation or acute inflammation. Spleen: Normal in size, without focal abnormality. Adrenals/Urinary Tract: Normal right adrenal gland. Mild left adrenal thickening. Bilateral too small to characterize renal lesions. Left renal upper pole cysts. Mild bladder distension. Stomach/Bowel: Normal stomach, without wall thickening. Surgical sutures within the rectum. Normal terminal ileum. Normal small bowel. Vascular/Lymphatic:  Advanced aortic and branch vessel atherosclerosis. Infrarenal abdominal aortic ectasia at 3.1 cm. Distal aortic/left common iliac artery stent graft. No abdominopelvic adenopathy. Reproductive: Mild prostatomegaly. Other: No significant free fluid. Musculoskeletal: Mild osteopenia. Presumed bone island in the right iliac. Lumbosacral spondylosis. IMPRESSION: 1. No acute or posttraumatic deformity identified. 2. Stent within the distal common duct, terminating at the inferior genu of the duodenum. Intra and extrahepatic biliary duct dilatation above the stent. Correlate with bilirubin levels. The stent may need to be repositioned or replaced to traverse the presumed area of stricture. 3. Coronary artery atherosclerosis. Aortic Atherosclerosis (ICD10-I70.0). 4. Infrarenal abdominal aortic ectasia at 3.1 cm. Recommend followup by ultrasound in 3 years. This recommendation follows ACR consensus guidelines: White Paper of the ACR Incidental Findings Committee II on Vascular Findings. J Am Coll Radiol 2013; 10:789-794. Aortic aneurysm NOS (ICD10-I71.9). Electronically Signed   By: Hosie Spangle.D.  On: 12/10/2018 18:16   Dg Chest Port 1 View  Result Date: 12/10/2018 CLINICAL DATA:  Vomiting. Fall. Hypertension. Pacemaker. Ex-smoker. EXAM: PORTABLE CHEST 1 VIEW COMPARISON:  03/30/2018 report. 03/27/2018 images and report. FINDINGS: Dual lead pacer. Midline trachea. Moderate cardiomegaly. Atherosclerosis in the transverse aorta. Right costophrenic angle minimally excluded. No pleural effusion or pneumothorax. No congestive failure. Suggestion of mild hyperinflation with interstitial thickening. No lobar consolidation. IMPRESSION: Cardiomegaly without congestive failure. Mild hyperinflation. Aortic Atherosclerosis (ICD10-I70.0).  In Electronically Signed   By: Jeronimo GreavesKyle  Talbot M.D.   On: 12/10/2018 16:59   [place] Abdomen Limited Ruq  Result Date: 12/10/2018 CLINICAL DATA:  85 y/o  M; elevated liver function test. EXAM:  ULTRASOUND ABDOMEN LIMITED RIGHT UPPER QUADRANT COMPARISON:  None. FINDINGS: Gallbladder: Cholecystectomy. Common bile duct: Diameter: 11.7 mm Liver: Intrahepatic biliary ductal dilatation. No focal liver abnormality identified. Portal vein is patent on color Doppler imaging with normal direction of blood flow towards the liver. IMPRESSION: Intra and extrahepatic biliary ductal dilatation as seen on prior CT of abdomen and pelvis. The distal common bile duct is not visible due to bowel gas shadow. Electronically Signed   By: Mitzi HansenLance  Furusawa-Stratton M.D.   On: 12/10/2018 21:53    EKG: Independently reviewed.  It shows sinus rhythm with a rate of 66, evidence of LVH by voltage criteria, poor R wave progression.  Prolonged PR interval, right bundle branch block possible left active fascicular block.  Appears to be unchanged from previous.  Assessment/Plan Principal Problem:   SDH (subdural hematoma) (HCC) Active Problems:   Pancreatitis, recurrent   History of CVA (cerebrovascular accident)   Hypertension goal BP (blood pressure) < 140/90   Atrial fibrillation (HCC)   Depression   Multiple falls   Gallstone of bile duct with obstruction     #1 intracranial hemorrhage: Multiple subdural and subarachnoid hemorrhages from trauma.  Appreciate neurosurgical consult.  No emergency surgical procedure planned.  Admit the patient to progressive care.  Supportive measures.  Follow-up head CTs.  Follow neurosurgical recommendations.  We will aim admitting leaving systolic pressure less than 180.  #2 hypertensive urgency: Patient will be given IV labetalol as needed.  Resume his home regimen since he is more awake and communicating.  Adjust blood pressure medications with a target systolic of 180 or less.  #3 history of CVAs: Due to acute bleed patient cannot be on any aspirin or Plavix.  Monitor closely.  #4 recurrent pancreatitis: No evidence of pancreatitis at the moment but increased LFTs.  This could  be due to some obstructive CBD stone.  We will get right upper quadrant abdominal ultrasound to evaluate.  #5 transaminitis: As per #4.  #6 history of recurrent falls: Most likely gait abnormalities.  Patient will get excessive PT and OT once cleared by neurosurgery.  #7 paroxysmal atrial fibrillation: Currently in sinus rhythm.  No anticoagulation especially with intracranial bleeds.  Monitor closely.  #8 history of depression with anxiety: Resume home regimen as soon as practicable.   DVT prophylaxis: SCDs Code Status: Full code Family Communication: Family not reachable tonight.  Patient fully awake and alert and communicating so discussed care with him Disposition Plan: To be determined Consults called: Dr. Johnsie Cancelstergaard, neurosurgery Admission status: Inpatient  Severity of Illness: The appropriate patient status for this patient is INPATIENT. Inpatient status is judged to be reasonable and necessary in order to provide the required intensity of service to ensure the patient's safety. The patient's presenting symptoms, physical exam findings, and initial radiographic  and laboratory data in the context of their chronic comorbidities is felt to place them at high risk for further clinical deterioration. Furthermore, it is not anticipated that the patient will be medically stable for discharge from the hospital within 2 midnights of admission. The following factors support the patient status of inpatient.   " The patient's presenting symptoms include fall with head injury. " The worrisome physical exam findings include mild agitation. " The initial radiographic and laboratory data are worrisome because of evidence of subdural hematoma on head CT. " The chronic co-morbidities include history of CVA.   * I certify that at the point of admission it is my clinical judgment that the patient will require inpatient hospital care spanning beyond 2 midnights from the point of admission due to high  intensity of service, high risk for further deterioration and high frequency of surveillance required.Lonia Blood*    Valina Maes,LAWAL MD [facility] Pager [phone]  If 7PM-7AM, please contact night-coverage [email or web address] Password [email or web address]  12/11/2018, 12:06 AM

## 2018-12-10 NOTE — ED Notes (Signed)
Unable to due swallow screen at this time due to nausea

## 2018-12-10 NOTE — ED Notes (Signed)
Pt desatted to 74% on room air, while awake and with good wave form on SP02 monitor. Pt did not respond to Nasal cannula at 4L, O2 only improved @ 6L. MD aware.

## 2018-12-10 NOTE — Progress Notes (Signed)
Pt admit BP 202/60. Paged PCCM for PRNs as none are ordered. Waiting for response.

## 2018-12-10 NOTE — Consult Note (Signed)
Neurosurgery Consultation  Reason for Consult: Subdural hematoma Referring Physician: Ralene Bathe  CC: Head trauma  HPI: This is a 85 y.o. man s/p fall, amnestic to the event, brought in by family.  He had some vomiting, which sounds like the reason they decided to bring him to the ED, he was also a little off his cognitive baseline after the fall. During my examination, he has a mild headache that is sharp and localized retro-orbital OS. His chief complaint is abdominal pain and he is perseverative that he needs to have a BM but can't. He denies any new numbness / weakness / parasthesias, no current nausea.    ROS: A 14 point ROS was performed and is negative except as noted in the HPI.   PMHx:  Past Medical History:  Diagnosis Date  . Actinic keratoses   . Aortic stenosis   . Atrial fibrillation ([place])   . CVA (cerebral vascular accident) ([place])   . High cholesterol   . History of colon cancer   . History of CVA (cerebrovascular accident)   . Hypertension   . Lower urinary tract symptoms (LUTS)   . Pancreatitis, recurrent    FamHx:  Family History  Problem Relation Age of Onset  . Testicular cancer Father    SocHx:  reports that he has quit smoking. His smoking use included cigarettes. He has a 5.00 pack-year smoking history. He has never used smokeless tobacco. He reports current alcohol use. He reports that he does not use drugs.  Exam: Vital signs in last 24 hours: Temp:  [97.7 F (36.5 C)] 97.7 F (36.5 C) (07/05 1323) Pulse Rate:  [63-79] 71 (07/05 1830) Resp:  [13-24] 23 (07/05 1830) BP: (190-205)/(59-75) 200/73 (07/05 1830) SpO2:  [78 %-100 %] 100 % (07/05 1830) Weight:  [72.6 kg] 72.6 kg (07/05 1325) General: Awake, alert, cooperative, lying in bed in NAD Head: normocephalic and atruamatic HEENT: neck supple Pulmonary: breathing room air comfortably, no evidence of increased work of breathing Cardiac: RRR Abdomen: Tender, was resistant to me completing my abdominal  exam Extremities: warm and well perfused x4, decreased bulk diffusely Neuro: AOx3, PERRL, EOMI, FS Strength 5/5 x4, SILTx4, no drift   Assessment and Plan: 85 y.o. man s/p fall, on ASA81. [place] personally reviewed, which shows a right acute SDH measuring roughly 35mm with 9mm of midline shift at the septum pellucidum. On exam, neurologically intact.  -no acute neurosurgical intervention indicated at this time. Pt looks better than his imaging, which is reassuring. He has appropriate age-related atrophy, which is likely helping significantly. Will follow clinically, no need for scheduled repeat imaging.  -recommend step-down overnight with telemetry -goal SBP<180 overnight then can relax BP from a neurosurgical perspective -please call with any concerns or questions  Judith Part, MD 12/10/18 8:49 PM [facility]

## 2018-12-10 NOTE — ED Triage Notes (Signed)
Patient arrives with son who he lives with, stating he was woken up by patient at 2am after pt fell while trying to get to bathroom. States he got the patient back into bed and when the caregiver came at 730 am the patient was on the floor in his closet. Skin tear to the left arm wrapped by family. Patient has been c/o feeling lethargic and off. Son gave patient 2 tylenol around 1030 this am and shortly after patient had an episode of emesis. Patient c/o left sided headache.   Patient has history of stroke that left him weaker on the left side. Patient usually ambulates with walker.

## 2018-12-10 NOTE — ED Notes (Signed)
Pt resting quietly at this time with eyes closed 

## 2018-12-10 NOTE — ED Provider Notes (Addendum)
[facility] EMERGENCY DEPARTMENT Provider Note   CSN: [id number] Arrival date & time: 12/10/18  1302    History   Chief Complaint Chief Complaint  Patient presents with   Fall    HPI Andrew Wrede. is a 85 y.o. male.     Andrew Churn. Andrew Resides. Is an 85 y/o male who presents to the ED with due to a fall with subsequent lethargy and vomiting. Andrew Soto had two fall events the morning of 12/10/2018. At 2:00 a.m. Andrew Soto fell and his son helped him to bed, with no complaints. Later that morning, Andrew Soto was found in his closet by his son and home health worker with no recollection of the second fall. Per his son, Andrew Soto was lethargic and "not his usual self." Andrew Soto stated that he felt nauseous and vomited after breakfast. He took a nap after breakfast, which is out of character for him. He has had two tylenol, and came to the ED. The patient complains of a headache on the left side of his head and nausea with no other symptoms.   The history is provided by the patient and a relative.    Past Medical History:  Diagnosis Date   Actinic keratoses    Aortic stenosis    Atrial fibrillation (HCC)    CVA (cerebral vascular accident) (HCC)    High cholesterol    History of colon cancer    History of CVA (cerebrovascular accident)    Hypertension    Lower urinary tract symptoms (LUTS)    Pancreatitis, recurrent     Patient Active Problem List   Diagnosis Date Noted   Pressure ulcer of right buttock, stage 2 (HCC) 09/04/2018   Multiple falls 09/04/2018   Weakness of both lower extremities 09/04/2018   Acquired left foot drop 09/04/2018   Uses walker 09/04/2018   Dyspepsia 09/04/2018   Therapeutic opioid induced constipation 07/12/2018   Nausea without vomiting 07/12/2018   History of syncope 07/12/2018   Depression 07/12/2018   Encounter for long-term (current) use of medications 07/12/2018   Drug-induced constipation  06/30/2018   Lumbar spinal stenosis 06/23/2018   Aortic atherosclerosis (HCC) 05/26/2018   Muscle weakness of lower extremity 04/23/2018   Gait disturbance, post-stroke 04/23/2018   History of fall 04/23/2018   Hypertension goal BP (blood pressure) < 140/90 04/23/2018   Pacemaker 04/23/2018   Atrial fibrillation (HCC) 04/23/2018   Lower urinary tract symptoms (LUTS)    Pancreatitis, recurrent    History of CVA (cerebrovascular accident)    History of colon cancer     Past Surgical History:  Procedure Laterality Date   ABDOMINAL SURGERY     CHOLECYSTECTOMY     COLON RESECTION     MOHS SURGERY     Right eyebrow   PACEMAKER IMPLANT     VALVE REPLACEMENT          Home Medications    Prior to Admission medications   Medication Sig Start Date End Date Taking? Authorizing Provider  AMBULATORY NON FORMULARY MEDICATION Motorized wheelchair. Disp 1 R26.9 08/22/18   Rodolph Bong, MD  amiodarone (PACERONE) 200 MG tablet TAKE 1 TABLET BY MOUTH EVERY DAY 10/05/18   Carlis Stable, PA-C  aspirin 81 MG tablet Take 81 mg by mouth daily.     [provider]  Cholecalciferol (VITAMIN D3 PO) Take 1 tablet by mouth daily.    [provider]  clotrimazole-betamethasone (LOTRISONE) cream Apply 1 application  topically 2 (two) times daily. To affected skin until rash resolves then continue for 5-7 days after that 08/08/18   Sunnie NielsenAlexander, Natalie, DO  cyanocobalamin ([employer] VITAMIN B12) 1000 MCG tablet Take 1,000 mcg by mouth daily.     [provider]  DULoxetine (CYMBALTA) 20 MG capsule Take 2 capsules (40 mg total) by mouth daily. 08/15/18   Rodolph Bongorey, Evan S, MD  ezetimibe (ZETIA) 10 MG tablet TAKE 1 TABLET BY MOUTH EVERY DAY 10/09/18   Carlis Stableummings, Charley Elizabeth, PA-C  famotidine (PEPCID) 20 MG tablet Take 1 tablet (20 mg total) by mouth 2 (two) times daily as needed for heartburn or indigestion. 09/04/18   Carlis Stableummings, Charley Elizabeth, PA-C    ketoconazole (NIZORAL) 2 % shampoo  06/10/18   [provider]  NONFORMULARY OR COMPOUNDED ITEM Gel donut cushion 09/04/18   Carlis Stableummings, Charley Elizabeth, [place]  NONFORMULARY OR COMPOUNDED ITEM Gel mattress topper for pressure relief 09/04/18   Carlis Stableummings, Charley Elizabeth, PA-C  tamsulosin (FLOMAX) 0.4 MG CAPS capsule TAKE 1 CAPSULE BY MOUTH EVERY DAY 07/17/18   Carlis Stableummings, Charley Elizabeth, PA-C  valsartan (DIOVAN) 80 MG tablet Take 1 tablet (80 mg total) by mouth daily. 10/25/18   Lewayne Buntingrenshaw, Brian S, MD    Family History Family History  Problem Relation Age of Onset   Testicular cancer Father     Social History Social History   Tobacco Use   Smoking status: Former Smoker    Packs/day: 1.00    Years: 5.00    Pack years: 5.00    Types: Cigarettes   Smokeless tobacco: Never Used  Substance Use Topics   Alcohol use: Yes    Comment: glass wine daily   Drug use: Never     Allergies   Epinephrine, Penicillins, and Vicodin [hydrocodone-acetaminophen]   Review of Systems Review of Systems  Constitutional: Negative for activity change, chills and fever.  HENT: Negative for drooling, ear pain, facial swelling and sore throat.   Eyes: Negative for pain and visual disturbance.  Respiratory: Negative for cough and shortness of breath.   Cardiovascular: Negative for chest pain and palpitations.  Gastrointestinal: Positive for blood in stool, constipation and vomiting. Negative for abdominal pain and diarrhea.       Notices blood on tissue paper on excessive wiping.   Genitourinary: Negative for dysuria and hematuria.  Musculoskeletal: Negative for arthralgias and back pain.  Skin: Positive for wound. Negative for color change and rash.       R. Upper extremity laceration from a previous fall.   Neurological: Negative for seizures and syncope.  All other systems reviewed and are negative.    Physical Exam Updated Vital Signs BP (!) 200/73    Pulse 71    Temp 97.7 F  (36.5 C) (Oral)    Resp (!) 23    Wt 72.6 kg    SpO2 100%    BMI 25.82 kg/m   Physical Exam Vitals signs and nursing note reviewed.  Constitutional:      Appearance: He is well-developed and normal weight. He is ill-appearing.     Comments: lethargic  HENT:     Head: Normocephalic and atraumatic.  Eyes:     General:        Right eye: No discharge.        Left eye: No discharge.     Conjunctiva/sclera: Conjunctivae normal.  Neck:     Musculoskeletal: Neck supple.  Cardiovascular:     Rate and Rhythm: Normal rate and regular rhythm.  Heart sounds: No murmur.  Pulmonary:     Effort: Pulmonary effort is normal. No respiratory distress.     Breath sounds: Normal breath sounds.  Abdominal:     Palpations: Abdomen is soft.     Tenderness: There is no abdominal tenderness.  Musculoskeletal:        General: No tenderness, deformity or signs of injury.  Skin:    General: Skin is warm and dry.     Coloration: Skin is not jaundiced or pale.     Findings: Bruising present. No erythema or rash.      ED Treatments / Results  Labs (all labs ordered are listed, but only abnormal results are displayed) Labs Reviewed  COMPREHENSIVE METABOLIC PANEL - Abnormal; Notable for the following components:      Result Value   Sodium 132 (*)    Glucose, Bld 143 (*)    Total Protein 5.8 (*)    Albumin 3.1 (*)    AST 98 (*)    ALT 154 (*)    Alkaline Phosphatase 701 (*)    Total Bilirubin 2.4 (*)    All other components within normal limits  CBC WITH DIFFERENTIAL/PLATELET - Abnormal; Notable for the following components:   RBC 4.11 (*)    MCV 101.2 (*)    Neutro Abs 8.6 (*)    Lymphs Abs 0.3 (*)    Abs Immature Granulocytes 0.25 (*)    All other components within normal limits  URINALYSIS, ROUTINE W REFLEX MICROSCOPIC - Abnormal; Notable for the following components:   Protein, ur 30 (*)    All other components within normal limits  CBG MONITORING, ED - Abnormal; Notable for the  following components:   Glucose-Capillary 136 (*)    All other components within normal limits  SARS CORONAVIRUS 2 (HOSPITAL ORDER, PERFORMED IN [facility])  URINE CULTURE  BRAIN NATRIURETIC PEPTIDE  TROPONIN I (HIGH SENSITIVITY)  TROPONIN I (HIGH SENSITIVITY)    EKG EKG Interpretation  Date/Time:  Sunday December 10 2018 13:23:51 EDT Ventricular Rate:  66 PR Interval:    QRS Duration: 168 QT Interval:  474 QTC Calculation: 497 R Axis:   -77 Text Interpretation:  Sinus rhythm Prolonged PR interval RBBB and LAFB LVH with secondary repolarization abnormality Confirmed by Tilden Fossaees, Elizabeth [phone]([id number]) on 12/10/2018 5:15:37 PM   Radiology Ct Head Wo Contrast  Result Date: 12/10/2018 CLINICAL DATA:  Posttraumatic headache after fall. EXAM: CT HEAD WITHOUT CONTRAST CT CERVICAL SPINE WITHOUT CONTRAST TECHNIQUE: Multidetector CT imaging of the head and cervical spine was performed following the standard protocol without intravenous contrast. Multiplanar CT image reconstructions of the cervical spine were also generated. COMPARISON:  CT scan of March 27, 2018. FINDINGS: CT HEAD FINDINGS Brain: Ventricular size is within normal limits. Mild chronic ischemic white matter disease is noted. Mild diffuse cortical atrophy is noted. Large subdural hematoma is seen overlying the right cerebral cortex and right tentorial region. Subdural hemorrhage is also noted in the right parafalcine region. This results in 5 mm of right to left midline shift. Maximum thickness of 15 mm is noted. Focus of subarachnoid hemorrhage is noted in right parietal cortex. Small left subdural hematoma is seen overlying left parietal cortex. Vascular: No hyperdense vessel or unexpected calcification. Skull: Normal. Negative for fracture or focal lesion. Sinuses/Orbits: No acute finding. Other: None. CT CERVICAL SPINE FINDINGS Alignment: Grade 1 retrolisthesis of C5-6 is noted secondary to severe degenerative disc disease at this  level. Grade 1 anterolisthesis of C7-T1  is noted secondary to posterior facet joint hypertrophy. Skull base and vertebrae: No acute fracture. No primary bone lesion or focal pathologic process. Soft tissues and spinal canal: No prevertebral fluid or swelling. No visible canal hematoma. Disc levels: Severe degenerative disc disease is noted at C3-4, C4-5, C5-6, C6-7 and C7-T1. Upper chest: Negative. Other: Degenerative changes are seen involving posterior facet joints bilaterally. IMPRESSION: Large subdural hematoma is seen overlying right cerebral cortex and right tentorial region, resulting in 5 mm of right to left midline shift. Demonstrates a maximum thickness of 15 mm. Focus of subarachnoid hemorrhage is noted in the right parietal cortex. Small left subdural hematoma is seen overlying left parietal cortex. Critical Value/emergent results were called by telephone at the time of interpretation on 12/10/2018 at 3:45 pm to Dr. Quintella Reichert , who verbally acknowledged these results. Severe multilevel degenerative disc disease is noted in the cervical spine. No fracture or acute abnormality is noted. Electronically Signed   By: Marijo Conception M.D.   On: 12/10/2018 15:46   Ct Chest W Contrast  Result Date: 12/10/2018 CLINICAL DATA:  Abdominal trauma.  Vomiting.  Head bleed. EXAM: CT CHEST, ABDOMEN, AND PELVIS WITH CONTRAST TECHNIQUE: Multidetector CT imaging of the chest, abdomen and pelvis was performed following the standard protocol during bolus administration of intravenous contrast. CONTRAST:  174mL OMNIPAQUE IOHEXOL 300 MG/ML  SOLN COMPARISON:  Chest radiograph of earlier in the day.  No prior CTs. FINDINGS: CT CHEST FINDINGS Cardiovascular: Pacer. Aortic valve repair. Aortic and branch vessel atherosclerosis. Mild cardiomegaly, without pericardial effusion. Multivessel coronary artery atherosclerosis. No aortic laceration or mediastinal hematoma. Mediastinum/Nodes: No mediastinal or hilar adenopathy.  Lungs/Pleura: No pleural fluid. Minimal left pleural thickening. No pneumothorax. No pulmonary contusion or consolidation. Subpleural interstitial prominence is suspicious for mild interstitial lung disease. Musculoskeletal: Degenerate changes of both glenohumeral joints, worse on the right. CT ABDOMEN PELVIS FINDINGS Hepatobiliary: No focal liver lesion. Cholecystectomy. Mild intrahepatic biliary duct dilatation. The common duct measures 1.4 cm in the porta hepatis on coronal image 54. A distal common duct stent originates at the level of the pancreatic head and terminates at the inferior genu of the duodenum. Pancreas: Normal pancreas, without duct dilatation or acute inflammation. Spleen: Normal in size, without focal abnormality. Adrenals/Urinary Tract: Normal right adrenal gland. Mild left adrenal thickening. Bilateral too small to characterize renal lesions. Left renal upper pole cysts. Mild bladder distension. Stomach/Bowel: Normal stomach, without wall thickening. Surgical sutures within the rectum. Normal terminal ileum. Normal small bowel. Vascular/Lymphatic: Advanced aortic and branch vessel atherosclerosis. Infrarenal abdominal aortic ectasia at 3.1 cm. Distal aortic/left common iliac artery stent graft. No abdominopelvic adenopathy. Reproductive: Mild prostatomegaly. Other: No significant free fluid. Musculoskeletal: Mild osteopenia. Presumed bone island in the right iliac. Lumbosacral spondylosis. IMPRESSION: 1. No acute or posttraumatic deformity identified. 2. Stent within the distal common duct, terminating at the inferior genu of the duodenum. Intra and extrahepatic biliary duct dilatation above the stent. Correlate with bilirubin levels. The stent may need to be repositioned or replaced to traverse the presumed area of stricture. 3. Coronary artery atherosclerosis. Aortic Atherosclerosis (ICD10-I70.0). 4. Infrarenal abdominal aortic ectasia at 3.1 cm. Recommend followup by ultrasound in 3 years.  This recommendation follows ACR consensus guidelines: White Paper of the ACR Incidental Findings Committee II on Vascular Findings. J Am Coll Radiol 2013; 10:789-794. Aortic aneurysm NOS (ICD10-I71.9). Electronically Signed   By: Abigail Miyamoto M.D.   On: 12/10/2018 18:16   Ct Cervical Spine Wo Contrast  Result  Date: 12/10/2018 CLINICAL DATA:  Posttraumatic headache after fall. EXAM: CT HEAD WITHOUT CONTRAST CT CERVICAL SPINE WITHOUT CONTRAST TECHNIQUE: Multidetector CT imaging of the head and cervical spine was performed following the standard protocol without intravenous contrast. Multiplanar CT image reconstructions of the cervical spine were also generated. COMPARISON:  CT scan of March 27, 2018. FINDINGS: CT HEAD FINDINGS Brain: Ventricular size is within normal limits. Mild chronic ischemic white matter disease is noted. Mild diffuse cortical atrophy is noted. Large subdural hematoma is seen overlying the right cerebral cortex and right tentorial region. Subdural hemorrhage is also noted in the right parafalcine region. This results in 5 mm of right to left midline shift. Maximum thickness of 15 mm is noted. Focus of subarachnoid hemorrhage is noted in right parietal cortex. Small left subdural hematoma is seen overlying left parietal cortex. Vascular: No hyperdense vessel or unexpected calcification. Skull: Normal. Negative for fracture or focal lesion. Sinuses/Orbits: No acute finding. Other: None. CT CERVICAL SPINE FINDINGS Alignment: Grade 1 retrolisthesis of C5-6 is noted secondary to severe degenerative disc disease at this level. Grade 1 anterolisthesis of C7-T1 is noted secondary to posterior facet joint hypertrophy. Skull base and vertebrae: No acute fracture. No primary bone lesion or focal pathologic process. Soft tissues and spinal canal: No prevertebral fluid or swelling. No visible canal hematoma. Disc levels: Severe degenerative disc disease is noted at C3-4, C4-5, C5-6, C6-7 and C7-T1. Upper  chest: Negative. Other: Degenerative changes are seen involving posterior facet joints bilaterally. IMPRESSION: Large subdural hematoma is seen overlying right cerebral cortex and right tentorial region, resulting in 5 mm of right to left midline shift. Demonstrates a maximum thickness of 15 mm. Focus of subarachnoid hemorrhage is noted in the right parietal cortex. Small left subdural hematoma is seen overlying left parietal cortex. Critical Value/emergent results were called by telephone at the time of interpretation on 12/10/2018 at 3:45 pm to Dr. Tilden FossaELIZABETH REES , who verbally acknowledged these results. Severe multilevel degenerative disc disease is noted in the cervical spine. No fracture or acute abnormality is noted. Electronically Signed   By: Lupita RaiderJames  Green Jr M.D.   On: 12/10/2018 15:46   Ct Abdomen Pelvis W Contrast  Result Date: 12/10/2018 CLINICAL DATA:  Abdominal trauma.  Vomiting.  Head bleed. EXAM: CT CHEST, ABDOMEN, AND PELVIS WITH CONTRAST TECHNIQUE: Multidetector CT imaging of the chest, abdomen and pelvis was performed following the standard protocol during bolus administration of intravenous contrast. CONTRAST:  100mL OMNIPAQUE IOHEXOL 300 MG/ML  SOLN COMPARISON:  Chest radiograph of earlier in the day.  No prior CTs. FINDINGS: CT CHEST FINDINGS Cardiovascular: Pacer. Aortic valve repair. Aortic and branch vessel atherosclerosis. Mild cardiomegaly, without pericardial effusion. Multivessel coronary artery atherosclerosis. No aortic laceration or mediastinal hematoma. Mediastinum/Nodes: No mediastinal or hilar adenopathy. Lungs/Pleura: No pleural fluid. Minimal left pleural thickening. No pneumothorax. No pulmonary contusion or consolidation. Subpleural interstitial prominence is suspicious for mild interstitial lung disease. Musculoskeletal: Degenerate changes of both glenohumeral joints, worse on the right. CT ABDOMEN PELVIS FINDINGS Hepatobiliary: No focal liver lesion. Cholecystectomy. Mild  intrahepatic biliary duct dilatation. The common duct measures 1.4 cm in the porta hepatis on coronal image 54. A distal common duct stent originates at the level of the pancreatic head and terminates at the inferior genu of the duodenum. Pancreas: Normal pancreas, without duct dilatation or acute inflammation. Spleen: Normal in size, without focal abnormality. Adrenals/Urinary Tract: Normal right adrenal gland. Mild left adrenal thickening. Bilateral too small to characterize renal lesions. Left renal  upper pole cysts. Mild bladder distension. Stomach/Bowel: Normal stomach, without wall thickening. Surgical sutures within the rectum. Normal terminal ileum. Normal small bowel. Vascular/Lymphatic: Advanced aortic and branch vessel atherosclerosis. Infrarenal abdominal aortic ectasia at 3.1 cm. Distal aortic/left common iliac artery stent graft. No abdominopelvic adenopathy. Reproductive: Mild prostatomegaly. Other: No significant free fluid. Musculoskeletal: Mild osteopenia. Presumed bone island in the right iliac. Lumbosacral spondylosis. IMPRESSION: 1. No acute or posttraumatic deformity identified. 2. Stent within the distal common duct, terminating at the inferior genu of the duodenum. Intra and extrahepatic biliary duct dilatation above the stent. Correlate with bilirubin levels. The stent may need to be repositioned or replaced to traverse the presumed area of stricture. 3. Coronary artery atherosclerosis. Aortic Atherosclerosis (ICD10-I70.0). 4. Infrarenal abdominal aortic ectasia at 3.1 cm. Recommend followup by ultrasound in 3 years. This recommendation follows ACR consensus guidelines: White Paper of the ACR Incidental Findings Committee II on Vascular Findings. J Am Coll Radiol 2013; 10:789-794. Aortic aneurysm NOS (ICD10-I71.9). Electronically Signed   By: Jeronimo Greaves M.D.   On: 12/10/2018 18:16   Dg Chest Port 1 View  Result Date: 12/10/2018 CLINICAL DATA:  Vomiting. Fall. Hypertension. Pacemaker.  Ex-smoker. EXAM: PORTABLE CHEST 1 VIEW COMPARISON:  03/30/2018 report. 03/27/2018 images and report. FINDINGS: Dual lead pacer. Midline trachea. Moderate cardiomegaly. Atherosclerosis in the transverse aorta. Right costophrenic angle minimally excluded. No pleural effusion or pneumothorax. No congestive failure. Suggestion of mild hyperinflation with interstitial thickening. No lobar consolidation. IMPRESSION: Cardiomegaly without congestive failure. Mild hyperinflation. Aortic Atherosclerosis (ICD10-I70.0).  In Electronically Signed   By: Jeronimo Greaves M.D.   On: 12/10/2018 16:59    Procedures Procedures (including critical care time)  Medications Ordered in ED Medications  ondansetron (ZOFRAN) injection 4 mg (4 mg Intravenous Given 12/10/18 1511)  iohexol (OMNIPAQUE) 300 MG/ML solution 100 mL (100 mLs Intravenous Contrast Given 12/10/18 1721)  labetalol (NORMODYNE) injection 10 mg (10 mg Intravenous Given 12/10/18 1853)  ondansetron (ZOFRAN) injection 4 mg (4 mg Intravenous Given 12/10/18 1850)     Initial Impression / Assessment and Plan / ED Course  I have reviewed the triage vital signs and the nursing notes.  Pertinent labs & imaging results that were available during my care of the patient were reviewed by me and considered in my medical decision making (see chart for details).  Clinical Course as of Dec 11 843  Wynelle Link Dec 10, 2018  1908 CT Head Wo Contrast [SW]  2108 Dr. Mikeal Hawthorne to admit. Appreciate his involvement.    [AM]    Clinical Course User Index [AM] Elisha Ponder, PA-C [SW] Dolan Amen, MD  Assessment:  Andrew Churn. Vogelsang is a 85 y/o male that comes to the ED with subdural hematoma  Plan: - CBC with Differential - CMP r/o electrolyte disorder. - Urinalysis with reflex microscopic r/o UTI. - CT cervical spine without contrast r/o fracture. - CT head without contrast r/o bleed and trauma.  - Neurosurgery consultation - Continuation of care is still ongoing signing out to  [facility]         Final Clinical Impressions(s) / ED Diagnoses   Final diagnoses:  Subdural hematoma [facility])    ED Discharge Orders    None       Dolan Amen, MD 12/10/18 Jerilee Field, MD 12/11/18 1191    Tilden Fossa, MD 12/13/18 1152

## 2018-12-10 NOTE — ED Provider Notes (Signed)
Physical Exam  BP (!) 200/73   Pulse 71   Temp 97.7 F (36.5 C) (Oral)   Resp (!) 23   Wt 72.6 kg   SpO2 100%   BMI 25.82 kg/m   Assumed care from Dr. Tilden FossaElizabeth Rees at [phone]. Briefly, the patient is a 85 y.o. male with PMHx of  has a past medical history of Actinic keratoses, Aortic stenosis, Atrial fibrillation (HCC), CVA (cerebral vascular accident) (HCC), High cholesterol, History of colon cancer, History of CVA (cerebrovascular accident), Hypertension, Lower urinary tract symptoms (LUTS), and Pancreatitis, recurrent. here with multiple falls, and being found in the closet today after he had fallen to the ground.  He did sustain head trauma.  Found to have subdural hematoma with shift here in the emergency department.  Currently awaiting bed placement based on critical care recommendations.   Labs Reviewed  COMPREHENSIVE METABOLIC PANEL - Abnormal; Notable for the following components:      Result Value   Sodium 132 (*)    Glucose, Bld 143 (*)    Total Protein 5.8 (*)    Albumin 3.1 (*)    AST 98 (*)    ALT 154 (*)    Alkaline Phosphatase 701 (*)    Total Bilirubin 2.4 (*)    All other components within normal limits  CBC WITH DIFFERENTIAL/PLATELET - Abnormal; Notable for the following components:   RBC 4.11 (*)    MCV 101.2 (*)    Neutro Abs 8.6 (*)    Lymphs Abs 0.3 (*)    Abs Immature Granulocytes 0.25 (*)    All other components within normal limits  URINALYSIS, ROUTINE W REFLEX MICROSCOPIC - Abnormal; Notable for the following components:   Protein, ur 30 (*)    All other components within normal limits  BRAIN NATRIURETIC PEPTIDE - Abnormal; Notable for the following components:   B Natriuretic Peptide 1,078.9 (*)    All other components within normal limits  CBG MONITORING, ED - Abnormal; Notable for the following components:   Glucose-Capillary 136 (*)    All other components within normal limits  SARS CORONAVIRUS 2 (HOSPITAL ORDER, PERFORMED IN [place]  [facility])  URINE CULTURE  TROPONIN I (HIGH SENSITIVITY)  TROPONIN I (HIGH SENSITIVITY)    Course of Care:   Physical Exam Vitals signs and nursing note reviewed.  Constitutional:      General: He is not in acute distress.    Appearance: He is well-developed. He is not diaphoretic.     Comments: Lying in bed. Slightly confused and does not always answer questions appropriately but has coherent speech.   HENT:     Head: Normocephalic and atraumatic.  Eyes:     General:        Right eye: No discharge.        Left eye: No discharge.     Conjunctiva/sclera: Conjunctivae normal.     Comments: EOMs normal to gross examination.  Neck:     Musculoskeletal: Normal range of motion.  Cardiovascular:     Rate and Rhythm: Normal rate and regular rhythm.     Comments: Intact, 2+ radial pulse. Pulmonary:     Comments: Converses comfortably.  Abdominal:     General: There is no distension.  Musculoskeletal: Normal range of motion.  Skin:    General: Skin is warm and dry.  Neurological:     Mental Status: He is alert.     Comments: Cranial nerves intact to gross observation. Patient moves extremities without  difficulty.  Psychiatric:        Behavior: Behavior normal.        Thought Content: Thought content normal.        Judgment: Judgment normal.     ED Course/Procedures   Clinical Course as of Dec 10 2106  Nancy Fetter Dec 10, 2018  1908 CT Head Wo Contrast [SW]  2108 Dr. Jonelle Sidle to admit. Appreciate his involvement.    [AM]    Clinical Course User Index [AM] Albesa Seen, PA-C [SW] Maudie Mercury, MD    Procedures  MDM   8:51 PM Per Dr. Zada Finders, pt can go to SDU with medical management for transaminitis/biliary issues. Will page medicine. Appreciate his involvement.   Dr. Jonelle Sidle of [facility] hospitalist to admit.  I appreciate his involvement.  Recommends right upper quadrant ultrasound.  Order placed.    Tamala Julian 12/10/18 2109    Quintella Reichert,  MD 12/13/18 1135

## 2018-12-10 NOTE — ED Notes (Signed)
MD currently at bedside.,  Will come back to draw labs.

## 2018-12-10 NOTE — ED Notes (Signed)
Pt returned from CT.  Son at bedside.  Pt remains alert and oriented.

## 2018-12-10 NOTE — ED Notes (Signed)
Pt returned from CT. Pt continues to c/o pain behind left eye.  Pt remains alert and oriented x's 3.   Pt's son remains at bedside

## 2018-12-10 NOTE — ED Notes (Signed)
Pt to CT at this time.

## 2018-12-11 ENCOUNTER — Encounter (HOSPITAL_COMMUNITY): Payer: Self-pay | Admitting: Anesthesiology

## 2018-12-11 ENCOUNTER — Telehealth: Payer: Self-pay | Admitting: Physician Assistant

## 2018-12-11 DIAGNOSIS — R945 Abnormal results of liver function studies: Secondary | ICD-10-CM

## 2018-12-11 LAB — COMPREHENSIVE METABOLIC PANEL
ALT: 129 U/L — ABNORMAL HIGH (ref 0–44)
AST: 66 U/L — ABNORMAL HIGH (ref 15–41)
Albumin: 3 g/dL — ABNORMAL LOW (ref 3.5–5.0)
Alkaline Phosphatase: 660 U/L — ABNORMAL HIGH (ref 38–126)
Anion gap: 12 (ref 5–15)
BUN: 13 mg/dL (ref 8–23)
CO2: 24 mmol/L (ref 22–32)
Calcium: 9 mg/dL (ref 8.9–10.3)
Chloride: 97 mmol/L — ABNORMAL LOW (ref 98–111)
Creatinine, Ser: 0.78 mg/dL (ref 0.61–1.24)
GFR calc Af Amer: >60 mL/min (ref >60)
GFR calc non Af Amer: >60 mL/min (ref >60)
Glucose, Bld: 160 mg/dL — ABNORMAL HIGH (ref 70–99)
Potassium: 4.1 mmol/L (ref 3.5–5.1)
Sodium: 133 mmol/L — ABNORMAL LOW (ref 135–145)
Total Bilirubin: 1.7 mg/dL — ABNORMAL HIGH (ref 0.3–1.2)
Total Protein: 5.9 g/dL — ABNORMAL LOW (ref 6.5–8.1)

## 2018-12-11 LAB — CBC
HCT: 39.1 % (ref 39.0–52.0)
Hemoglobin: 13 g/dL (ref 13.0–17.0)
MCH: 33 pg (ref 26.0–34.0)
MCHC: 33.2 g/dL (ref 30.0–36.0)
MCV: 99.2 fL (ref 80.0–100.0)
Platelets: 235 10*3/uL (ref 150–400)
RBC: 3.94 MIL/uL — ABNORMAL LOW (ref 4.22–5.81)
RDW: 14.5 % (ref 11.5–15.5)
WBC: 12.3 10*3/uL — ABNORMAL HIGH (ref 4.0–10.5)
nRBC: 0 % (ref 0.0–0.2)

## 2018-12-11 LAB — URINE CULTURE: Culture: NO GROWTH

## 2018-12-11 LAB — TROPONIN I (HIGH SENSITIVITY)
Troponin I (High Sensitivity): 43 ng/L — ABNORMAL HIGH (ref <18)
Troponin I (High Sensitivity): 75 ng/L — ABNORMAL HIGH (ref <18)

## 2018-12-11 LAB — MRSA PCR SCREENING: MRSA by PCR: NEGATIVE

## 2018-12-11 MED ORDER — LABETALOL HCL 5 MG/ML IV SOLN
10.0000 mg | INTRAVENOUS | Status: DC | PRN
  Administered 2018-12-12 (×3): 10 mg via INTRAVENOUS
  Filled 2018-12-11 (×3): qty 4

## 2018-12-11 MED ORDER — AMIODARONE HCL 200 MG PO TABS
200.0000 mg | ORAL_TABLET | Freq: Every day | ORAL | Status: DC
  Filled 2018-12-11: qty 1

## 2018-12-11 MED ORDER — HYDRALAZINE HCL 20 MG/ML IJ SOLN
10.0000 mg | Freq: Once | INTRAMUSCULAR | Status: AC
  Administered 2018-12-11: 10 mg via INTRAVENOUS
  Filled 2018-12-11: qty 1

## 2018-12-11 MED ORDER — FAMOTIDINE 20 MG PO TABS
20.0000 mg | ORAL_TABLET | Freq: Two times a day (BID) | ORAL | Status: DC | PRN
  Filled 2018-12-11: qty 1

## 2018-12-11 MED ORDER — IRBESARTAN 75 MG PO TABS
75.0000 mg | ORAL_TABLET | Freq: Every day | ORAL | Status: DC
  Administered 2018-12-11: 75 mg via ORAL
  Filled 2018-12-11: qty 1

## 2018-12-11 MED ORDER — EZETIMIBE 10 MG PO TABS
10.0000 mg | ORAL_TABLET | Freq: Every day | ORAL | Status: DC
  Administered 2018-12-11: 10 mg via ORAL
  Filled 2018-12-11 (×2): qty 1

## 2018-12-11 MED ORDER — DULOXETINE HCL 20 MG PO CPEP
40.0000 mg | ORAL_CAPSULE | Freq: Every day | ORAL | Status: DC
  Administered 2018-12-11: 17:00:00 40 mg via ORAL
  Filled 2018-12-11 (×4): qty 2

## 2018-12-11 MED ORDER — POLYETHYLENE GLYCOL 3350 17 G PO PACK
17.0000 g | PACK | Freq: Every day | ORAL | Status: DC
  Administered 2018-12-11: 17 g via ORAL
  Filled 2018-12-11 (×2): qty 1

## 2018-12-11 MED ORDER — TAMSULOSIN HCL 0.4 MG PO CAPS
0.4000 mg | ORAL_CAPSULE | Freq: Every day | ORAL | Status: DC
  Administered 2018-12-11: 17:00:00 0.4 mg via ORAL
  Filled 2018-12-11 (×2): qty 1

## 2018-12-11 NOTE — Progress Notes (Signed)
Patient arrived to unit hypertensive. Given one time dose 10 mg hydralazine to maintain SBP <180. Oriented x3 MAE. Troponin elevated. MD notified.

## 2018-12-11 NOTE — Progress Notes (Signed)
Neurosurgery Service Progress Note  Subjective: No acute events overnight, no significant headaches overnight   Objective: Vitals:   12/11/18 0500 12/11/18 0600 12/11/18 0700 12/11/18 0759  BP: (!) 141/50 (!) 149/46 (!) 151/45 (!) 147/46  Pulse: 61 (!) 58 60 61  Resp: 17 18 17 20   Temp:    98.2 F (36.8 C)  TempSrc:    Oral  SpO2: 99% 99% 99% 97%  Weight:      Height:       Temp (24hrs), Avg:98 F (36.7 C), Min:97.7 F (36.5 C), Max:98.2 F (36.8 C)  CBC Latest Ref Rng & Units 12/11/2018 12/10/2018 08/14/2018  WBC 4.0 - 10.5 K/uL 12.3(H) 9.8 9.9  Hemoglobin 13.0 - 17.0 g/dL 13.0 13.7 14.5  Hematocrit 39.0 - 52.0 % 39.1 41.6 42.4  Platelets 150 - 400 K/uL 235 232 220   BMP Latest Ref Rng & Units 12/11/2018 12/10/2018 11/03/2018  Glucose 70 - 99 mg/dL 160(H) 143(H) 129(H)  BUN 8 - 23 mg/dL 13 14 17   Creatinine 0.61 - 1.24 mg/dL 0.78 0.70 0.92  BUN/Creat Ratio 6 - 22 (calc) - - NOT APPLICABLE  Sodium 235 - 145 mmol/L 133(L) 132(L) 135  Potassium 3.5 - 5.1 mmol/L 4.1 4.2 4.4  Chloride 98 - 111 mmol/L 97(L) 100 98  CO2 22 - 32 mmol/L 24 23 26   Calcium 8.9 - 10.3 mg/dL 9.0 9.0 9.7    Intake/Output Summary (Last 24 hours) at 12/11/2018 0926 Last data filed at 12/11/2018 0900 Gross per 24 hour  Intake 460.73 ml  Output 200 ml  Net 260.73 ml    Current Facility-Administered Medications:  .  dextrose 5 %-0.45 % sodium chloride infusion, , Intravenous, Continuous, Garba, Mohammad L, MD, Last Rate: 50 mL/hr at 12/11/18 0900 .  irbesartan (AVAPRO) tablet 75 mg, 75 mg, Oral, Daily, Adhikari, Amrit, MD, 75 mg at 12/11/18 0916 .  labetalol (NORMODYNE) injection 10 mg, 10 mg, Intravenous, Q2H PRN, Adhikari, Amrit, MD .  ondansetron (ZOFRAN) tablet 4 mg, 4 mg, Oral, Q6H PRN **OR** ondansetron (ZOFRAN) injection 4 mg, 4 mg, Intravenous, Q6H PRN, Garba, Mohammad L, MD .  polyethylene glycol (MIRALAX / GLYCOLAX) packet 17 g, 17 g, Oral, Daily, Adhikari, Amrit, MD, 17 g at 12/11/18  3614   Physical Exam: AOx3, PERRL, EOMI, FS, Strength 5/5 x4, SILTx4, no drift  Assessment & Plan: 85 y.o. man s/p fall with large R SDH, on ASA81.  -looks remarkably improved today, much more interactive, doesn't even have a pronator drift. I suspect that he will continue to be asymptomatic from this hemorrhage, yesterday's mental status was likely due to severe concussion. I talked to his son about, post-discharge, the chance of subdural expansion and osmotic pull in 1-2 weeks and to let me know if he starts having changes in cognition / balance / strength.   Andrew Soto  12/11/18 9:26 AM

## 2018-12-11 NOTE — Anesthesia Preprocedure Evaluation (Deleted)
Anesthesia Evaluation    Reviewed: Allergy & Precautions, Patient's Chart, lab work & pertinent test results  Airway        Dental   Pulmonary former smoker,           Cardiovascular hypertension, Pt. on medications and Pt. on home beta blockers + dysrhythmias + pacemaker + Valvular Problems/Murmurs AS   ECHO: 1. Left ventricle exhibits mild to moderate concentric hypertrophy with preserved systolic function. Visually estimated ejection fraction is 60 to 65%. Impaired left ventricular relaxation. 2. Moderate left atrial enlargement 3.The aortic valve was not well visualized. There is history of possible TAVR surgery. Clinical correlation suggested. If more data is necessary patient may be a candidate for transesophageal echocardiography. The velocities across that valve were satisfactory and almost within normal limits. Mild aortic regurgitation. 3. Marked mitral annular calcification. 4. Ascending aorta was measured at 4.2 cm.   Neuro/Psych PSYCHIATRIC DISORDERS Depression CVA    GI/Hepatic negative GI ROS, Neg liver ROS,   Endo/Other  negative endocrine ROS  Renal/GU negative Renal ROS     Musculoskeletal negative musculoskeletal ROS (+)   Abdominal   Peds  Hematology HLD   Anesthesia Other Findings elevated lfts,  dilated bile duct, previous stent to CBD vs pancreatic duct.  Reproductive/Obstetrics                            Anesthesia Physical Anesthesia Plan  ASA: III  Anesthesia Plan: General   Post-op Pain Management:    Induction: Intravenous  PONV Risk Score and Plan: 3 and Ondansetron, Dexamethasone and Treatment may vary due to age or medical condition  Airway Management Planned: Oral ETT  Additional Equipment:   Intra-op Plan:   Post-operative Plan:   Informed Consent:   Plan Discussed with: CRNA  Anesthesia Plan Comments:         Anesthesia Quick  Evaluation

## 2018-12-11 NOTE — Telephone Encounter (Signed)
He fell last night and again this morning. Seems very weak, complaining of headache (did not hit head during falls, seemingly) and he does not generally have headaches. Very lethargic, and just asked to go back to bed due to not feeling well. Just need to get him in to see someone as a precautionary step. If you arent available, we will gladly see someone else.   FYI: Son says, Mr Andrew Soto is in [facility] for Subdural Hematoma

## 2018-12-11 NOTE — Progress Notes (Signed)
PT Cancellation Note  Patient Details Name: Andrew Soto. MRN: [id number] DOB: 07-Mar-1932   Cancelled Treatment:    Reason Eval/Treat Not Completed: Patient not medically ready Pt with troponin level of 75. Will await until cleared by Cardiology or initiation of anticoagulation prior to PT evaluation per protocol.   Marguarite Arbour A Otisha Spickler 12/11/2018, 1:14 PM Wray Kearns, PT, DPT Acute Rehabilitation Services Pager [phone] Office [phone]

## 2018-12-11 NOTE — Progress Notes (Signed)
Lab called with critical troponin of 75. PCCM paged to notify. Waiting for response.

## 2018-12-11 NOTE — Consult Note (Addendum)
[facility] Consult: 11:19 AM 12/11/2018  LOS: 1 day    Referring Provider: Dr Tawanna Solo  Primary Care Physician:  Trixie Dredge, PA-C in [place].  Primary Gastroenterologist:  unassigned     Reason for Consultation:  Elevated LFTs and dilated bile ducts.     HPI: Andrew Soto. is a 85 y.o. male.  PMH arrhythmia.  AV block, pacemaker in place.  Heart valve repair.  CVA, left-sided weakness.  Decubitus of buttock.  Scalp laceration.  Balance and gait instability, Frequent falls.  Syncopal spells.  Colon cancer with resection in 2006, did not require radiation or chemotherapy.  Patient denies subsequent follow-up, surveillance colonoscopy after that. Multiple bouts of pancreatitis of unknown clear etiology.  Patient is a poor historian, son thinks bouts began 110 or 2016.  All of these occurred while he was living in [place].  He was admitted to [facility] chole[facility] 2018 or 2019. Pt also says a later MD flet Atorvastatin may have caused pancreatitis and med switched to Zetia.  Patient has no recall of ERCP or biliary stent placement.  Patient tells me he moved to [facility] from [place] in 11/2017.  He is living with his son and his son's family.  Admitted yesterday after fall at home, lethargy and non-bloody vomiting. Hypertensive urgency in ED.  Imaging confirms large right SDH with left shift, smaller left SDH, SAH on right.  No need for neurosurgical intervention at present.  Elevated Troponins and BNP.    LFTs elevated.  t bili 2.4, alk phos 701, AST/ALT 98/154.  These normal 08/14/18. "Mild" acute pancreatitis in Feb 2020 according to a MD office visit note CTAP with contrast: Stent in distal CBD.  Dilated intra and extrahepatic  ducts.  1.4 cm CBD ? Need to reposition the stent.  CAD.  Infrarenal abdominal aortic ectasia.     Pt denies abdominal pain, anorexia.  Only nausea, vomiting was yesterday. Says wt is pretty stable.  Drinks small glass red wine daily.      Past Medical History:  Diagnosis Date   Actinic keratoses    Aortic stenosis    Atrial fibrillation (HCC)    CVA (cerebral vascular accident) ([place])    High cholesterol    History of colon cancer    History of CVA (cerebrovascular accident)    Hypertension    Lower urinary tract symptoms (LUTS)    Pancreatitis, recurrent     Past Surgical History:  Procedure Laterality Date   ABDOMINAL SURGERY     CHOLECYSTECTOMY     COLON RESECTION     MOHS SURGERY     Right eyebrow   PACEMAKER IMPLANT     VALVE REPLACEMENT      Prior to Admission medications   Medication Sig Start Date End Date Taking? Authorizing Provider  AMBULATORY NON FORMULARY MEDICATION Motorized wheelchair. Disp 1 R26.9 08/22/18   Gregor Hams, MD  amiodarone (PACERONE) 200 MG tablet TAKE 1 TABLET BY MOUTH EVERY DAY 10/05/18   Maisie Fus,  Elson Areas, PA-C  aspirin 81 MG tablet Take 81 mg by mouth daily.     [provider]  Cholecalciferol (VITAMIN D3 PO) Take 1 tablet by mouth daily.    [provider]  cyanocobalamin ([employer] VITAMIN B12) 1000 MCG tablet Take 1,000 mcg by mouth daily.     [provider]  DULoxetine (CYMBALTA) 20 MG capsule Take 2 capsules (40 mg total) by mouth daily. 08/15/18   Gregor Hams, MD  ezetimibe (ZETIA) 10 MG tablet TAKE 1 TABLET BY MOUTH EVERY DAY 10/09/18   Trixie Dredge, PA-C  famotidine (PEPCID) 20 MG tablet Take 1 tablet (20 mg total) by mouth 2 (two) times daily as needed for heartburn or indigestion. 09/04/18   Trixie Dredge, PA-C  ketoconazole (NIZORAL) 2 % shampoo  06/10/18   [provider]  NONFORMULARY OR COMPOUNDED ITEM Gel donut cushion 09/04/18   Trixie Dredge, [place]  NONFORMULARY OR COMPOUNDED ITEM Gel mattress topper for pressure relief 09/04/18   Trixie Dredge, PA-C  tamsulosin (FLOMAX) 0.4 MG CAPS capsule TAKE 1 CAPSULE BY MOUTH EVERY DAY 07/17/18   Trixie Dredge, PA-C  valsartan (DIOVAN) 80 MG tablet Take 1 tablet (80 mg total) by mouth daily. 10/25/18   Lelon Perla, MD    Scheduled Meds:  irbesartan  75 mg Oral Daily   polyethylene glycol  17 g Oral Daily   Infusions:  dextrose 5 % and 0.45% NaCl 50 mL/hr at 12/11/18 0900   PRN Meds: labetalol, ondansetron **OR** ondansetron (ZOFRAN) IV   Allergies as of 12/10/2018 - Review Complete 12/10/2018  Allergen Reaction Noted   Epinephrine Other (See Comments) 12/27/2017   Penicillins Rash 12/27/2017   Vicodin [hydrocodone-acetaminophen] Nausea Only 07/12/2018    Family History  Problem Relation Age of Onset   Testicular cancer Father     Social History   Socioeconomic History   Marital status: Widowed    Spouse name: Not on file   Number of children: 6   Years of education: Not on file   Highest education level: Not on file  Occupational History   Not on file  Social Needs   Financial resource strain: Not on file   Food insecurity    Worry: Not on file    Inability: Not on file   Transportation needs    Medical: Not on file    Non-medical: Not on file  Tobacco Use   Smoking status: Former Smoker    Packs/day: 1.00    Years: 5.00    Pack years: 5.00    Types: Cigarettes   Smokeless tobacco: Never Used  Substance and Sexual Activity   Alcohol use: Yes    Comment: glass wine daily   Drug use: Never   Sexual activity: Not Currently    Birth control/protection: Abstinence  Lifestyle   Physical activity    Days per week: Not on file    Minutes per session: Not on file   Stress: Not on file  Relationships   Social connections    Talks on phone: Not on file    Gets together: Not on file     Attends religious service: Not on file    Active member of club or organization: Not on file    Attends meetings of clubs or organizations: Not on file    Relationship status: Not on file   Intimate partner violence    Fear of current or ex partner: Not on file  Emotionally abused: Not on file    Physically abused: Not on file    Forced sexual activity: Not on file  Other Topics Concern   Not on file  Social History Narrative   Not on file    REVIEW OF SYSTEMS: Constitutional:  Weakness and lots of falls ENT:  No nose bleeds Pulm:  No DOB or cough but not active CV:  No palpitations, no LE edema.  GU:  No hematuria, no frequency GI:  Per HPI.  No heartburn or dysphagia.  Constipation, BM's ~ 2 x week.  Uses dulcolax and colace prn.   Heme:  Bruises easily, no unusual bleeding   Transfusions:  None.   Neuro:  + headache.  No peripheral tingling or numbness Derm:  No itching, no rash.  + sores from skin tears.   Endocrine:  No sweats or chills.  No polyuria or dysuria Immunization:  Not queried.   Travel:  None beyond local counties in last few months.    PHYSICAL EXAM: Vital signs in last 24 hours: Vitals:   12/11/18 0700 12/11/18 0759  BP: (!) 151/45 (!) 147/46  Pulse: 60 61  Resp: 17 20  Temp:  98.2 F (36.8 C)  SpO2: 99% 97%   Wt Readings from Last 3 Encounters:  12/10/18 63.9 kg  10/25/18 68 kg  09/04/18 65.8 kg    General: frail, thin, chronically ill looking. Head:  No obvious head trauma.  Facial rubor  Eyes:  No icterus or pallor Ears:  Slightly HPH  Nose:  No congestion or discharge Mouth:  Oral mucosa pink, clear, slightly dry.  Tongue midline.  Poor dentition Neck:  No mass, no JVD Lungs:  Crackles in bases, slight dyspnea when speaking.  + wet cough Heart: RRR with slight syst murmer Abdomen:  Soft, NT, ND.  No HSM, masses, bruits.  Well healed midline scar..   Rectal: deferred   Musc/Skeltl: no joint redness.  Arthritic deformity in fingers.   Kyphosis. Extremities:  No CCE  Neurologic:  Oriented to place, time, self, situation.  Rambling historian with multiple digressive stories, difficult to stay on point, needed redirecting to gain accurate history.     Skin:  Multiple purpura on arms.  Nodes:  No cervical adenopathy   Psych:  Cooperative, affect flat.    Intake/Output from previous day: 07/05 0701 - 07/06 0700 In: 360.8 [I.V.:360.8] Out: 200 [Urine:200] Intake/Output this shift: Total I/O In: 339.9 [P.O.:240; I.V.:99.9] Out: 275 [Urine:275]  LAB RESULTS: Recent Labs    12/10/18 1425 12/11/18 0239  WBC 9.8 12.3*  HGB 13.7 13.0  HCT 41.6 39.1  PLT 232 235   BMET Lab Results  Component Value Date   NA 133 (L) 12/11/2018   NA 132 (L) 12/10/2018   NA 135 11/03/2018   K 4.1 12/11/2018   K 4.2 12/10/2018   K 4.4 11/03/2018   CL 97 (L) 12/11/2018   CL 100 12/10/2018   CL 98 11/03/2018   CO2 24 12/11/2018   CO2 23 12/10/2018   CO2 26 11/03/2018   GLUCOSE 160 (H) 12/11/2018   GLUCOSE 143 (H) 12/10/2018   GLUCOSE 129 (H) 11/03/2018   BUN 13 12/11/2018   BUN 14 12/10/2018   BUN 17 11/03/2018   CREATININE 0.78 12/11/2018   CREATININE 0.70 12/10/2018   CREATININE 0.92 11/03/2018   CALCIUM 9.0 12/11/2018   CALCIUM 9.0 12/10/2018   CALCIUM 9.7 11/03/2018   LFT Recent Labs    12/10/18 1425 12/11/18 0239  PROT 5.8* 5.9*  ALBUMIN 3.1* 3.0*  AST 98* 66*  ALT 154* 129*  ALKPHOS 701* 660*  BILITOT 2.4* 1.7*   PT/INR No results found for: INR, PROTIME Hepatitis Panel No results for input(s): HEPBSAG, HCVAB, HEPAIGM, HEPBIGM in the last 72 hours. C-Diff No components found for: CDIFF Lipase     Component Value Date/Time   LIPASE 26 08/14/2018 1026    Drugs of Abuse  No results found for: LABOPIA, COCAINSCRNUR, LABBENZ, AMPHETMU, THCU, LABBARB   RADIOLOGY STUDIES: Ct Head Wo Contrast  Result Date: 12/10/2018 CLINICAL DATA:  Posttraumatic headache after fall. EXAM: CT HEAD WITHOUT CONTRAST CT  CERVICAL SPINE WITHOUT CONTRAST TECHNIQUE: Multidetector CT imaging of the head and cervical spine was performed following the standard protocol without intravenous contrast. Multiplanar CT image reconstructions of the cervical spine were also generated. COMPARISON:  CT scan of March 27, 2018. FINDINGS: CT HEAD FINDINGS Brain: Ventricular size is within normal limits. Mild chronic ischemic white matter disease is noted. Mild diffuse cortical atrophy is noted. Large subdural hematoma is seen overlying the right cerebral cortex and right tentorial region. Subdural hemorrhage is also noted in the right parafalcine region. This results in 5 mm of right to left midline shift. Maximum thickness of 15 mm is noted. Focus of subarachnoid hemorrhage is noted in right parietal cortex. Small left subdural hematoma is seen overlying left parietal cortex. Vascular: No hyperdense vessel or unexpected calcification. Skull: Normal. Negative for fracture or focal lesion. Sinuses/Orbits: No acute finding. Other: None. CT CERVICAL SPINE FINDINGS Alignment: Grade 1 retrolisthesis of C5-6 is noted secondary to severe degenerative disc disease at this level. Grade 1 anterolisthesis of C7-T1 is noted secondary to posterior facet joint hypertrophy. Skull base and vertebrae: No acute fracture. No primary bone lesion or focal pathologic process. Soft tissues and spinal canal: No prevertebral fluid or swelling. No visible canal hematoma. Disc levels: Severe degenerative disc disease is noted at C3-4, C4-5, C5-6, C6-7 and C7-T1. Upper chest: Negative. Other: Degenerative changes are seen involving posterior facet joints bilaterally. IMPRESSION: Large subdural hematoma is seen overlying right cerebral cortex and right tentorial region, resulting in 5 mm of right to left midline shift. Demonstrates a maximum thickness of 15 mm. Focus of subarachnoid hemorrhage is noted in the right parietal cortex. Small left subdural hematoma is seen overlying  left parietal cortex. Critical Value/emergent results were called by telephone at the time of interpretation on 12/10/2018 at 3:45 pm to Dr. Quintella Reichert , who verbally acknowledged these results. Severe multilevel degenerative disc disease is noted in the cervical spine. No fracture or acute abnormality is noted. Electronically Signed   By: Marijo Conception M.D.   On: 12/10/2018 15:46   Ct Chest W Contrast  Result Date: 12/10/2018 CLINICAL DATA:  Abdominal trauma.  Vomiting.  Head bleed. EXAM: CT CHEST, ABDOMEN, AND PELVIS WITH CONTRAST TECHNIQUE: Multidetector CT imaging of the chest, abdomen and pelvis was performed following the standard protocol during bolus administration of intravenous contrast. CONTRAST:  187m OMNIPAQUE IOHEXOL 300 MG/ML  SOLN COMPARISON:  Chest radiograph of earlier in the day.  No prior CTs. FINDINGS: CT CHEST FINDINGS Cardiovascular: Pacer. Aortic valve repair. Aortic and branch vessel atherosclerosis. Mild cardiomegaly, without pericardial effusion. Multivessel coronary artery atherosclerosis. No aortic laceration or mediastinal hematoma. Mediastinum/Nodes: No mediastinal or hilar adenopathy. Lungs/Pleura: No pleural fluid. Minimal left pleural thickening. No pneumothorax. No pulmonary contusion or consolidation. Subpleural interstitial prominence is suspicious for mild interstitial lung disease. Musculoskeletal: Degenerate changes of  both glenohumeral joints, worse on the right. CT ABDOMEN PELVIS FINDINGS Hepatobiliary: No focal liver lesion. Cholecystectomy. Mild intrahepatic biliary duct dilatation. The common duct measures 1.4 cm in the porta hepatis on coronal image 54. A distal common duct stent originates at the level of the pancreatic head and terminates at the inferior genu of the duodenum. Pancreas: Normal pancreas, without duct dilatation or acute inflammation. Spleen: Normal in size, without focal abnormality. Adrenals/Urinary Tract: Normal right adrenal gland. Mild left  adrenal thickening. Bilateral too small to characterize renal lesions. Left renal upper pole cysts. Mild bladder distension. Stomach/Bowel: Normal stomach, without wall thickening. Surgical sutures within the rectum. Normal terminal ileum. Normal small bowel. Vascular/Lymphatic: Advanced aortic and branch vessel atherosclerosis. Infrarenal abdominal aortic ectasia at 3.1 cm. Distal aortic/left common iliac artery stent graft. No abdominopelvic adenopathy. Reproductive: Mild prostatomegaly. Other: No significant free fluid. Musculoskeletal: Mild osteopenia. Presumed bone island in the right iliac. Lumbosacral spondylosis. IMPRESSION: 1. No acute or posttraumatic deformity identified. 2. Stent within the distal common duct, terminating at the inferior genu of the duodenum. Intra and extrahepatic biliary duct dilatation above the stent. Correlate with bilirubin levels. The stent may need to be repositioned or replaced to traverse the presumed area of stricture. 3. Coronary artery atherosclerosis. Aortic Atherosclerosis (ICD10-I70.0). 4. Infrarenal abdominal aortic ectasia at 3.1 cm. Recommend followup by ultrasound in 3 years. This recommendation follows ACR consensus guidelines: White Paper of the ACR Incidental Findings Committee II on Vascular Findings. J Am Coll Radiol 2013; 10:789-794. Aortic aneurysm NOS (ICD10-I71.9). Electronically Signed   By: Abigail Miyamoto M.D.   On: 12/10/2018 18:16   Ct Cervical Spine Wo Contrast  Result Date: 12/10/2018 CLINICAL DATA:  Posttraumatic headache after fall. EXAM: CT HEAD WITHOUT CONTRAST CT CERVICAL SPINE WITHOUT CONTRAST TECHNIQUE: Multidetector CT imaging of the head and cervical spine was performed following the standard protocol without intravenous contrast. Multiplanar CT image reconstructions of the cervical spine were also generated. COMPARISON:  CT scan of March 27, 2018. FINDINGS: CT HEAD FINDINGS Brain: Ventricular size is within normal limits. Mild chronic  ischemic white matter disease is noted. Mild diffuse cortical atrophy is noted. Large subdural hematoma is seen overlying the right cerebral cortex and right tentorial region. Subdural hemorrhage is also noted in the right parafalcine region. This results in 5 mm of right to left midline shift. Maximum thickness of 15 mm is noted. Focus of subarachnoid hemorrhage is noted in right parietal cortex. Small left subdural hematoma is seen overlying left parietal cortex. Vascular: No hyperdense vessel or unexpected calcification. Skull: Normal. Negative for fracture or focal lesion. Sinuses/Orbits: No acute finding. Other: None. CT CERVICAL SPINE FINDINGS Alignment: Grade 1 retrolisthesis of C5-6 is noted secondary to severe degenerative disc disease at this level. Grade 1 anterolisthesis of C7-T1 is noted secondary to posterior facet joint hypertrophy. Skull base and vertebrae: No acute fracture. No primary bone lesion or focal pathologic process. Soft tissues and spinal canal: No prevertebral fluid or swelling. No visible canal hematoma. Disc levels: Severe degenerative disc disease is noted at C3-4, C4-5, C5-6, C6-7 and C7-T1. Upper chest: Negative. Other: Degenerative changes are seen involving posterior facet joints bilaterally. IMPRESSION: Large subdural hematoma is seen overlying right cerebral cortex and right tentorial region, resulting in 5 mm of right to left midline shift. Demonstrates a maximum thickness of 15 mm. Focus of subarachnoid hemorrhage is noted in the right parietal cortex. Small left subdural hematoma is seen overlying left parietal cortex. Critical Value/emergent results were called  by telephone at the time of interpretation on 12/10/2018 at 3:45 pm to Dr. Quintella Reichert , who verbally acknowledged these results. Severe multilevel degenerative disc disease is noted in the cervical spine. No fracture or acute abnormality is noted. Electronically Signed   By: Marijo Conception M.D.   On: 12/10/2018  15:46   Ct Abdomen Pelvis W Contrast  Result Date: 12/10/2018 CLINICAL DATA:  Abdominal trauma.  Vomiting.  Head bleed. EXAM: CT CHEST, ABDOMEN, AND PELVIS WITH CONTRAST TECHNIQUE: Multidetector CT imaging of the chest, abdomen and pelvis was performed following the standard protocol during bolus administration of intravenous contrast. CONTRAST:  170m OMNIPAQUE IOHEXOL 300 MG/ML  SOLN COMPARISON:  Chest radiograph of earlier in the day.  No prior CTs. FINDINGS: CT CHEST FINDINGS Cardiovascular: Pacer. Aortic valve repair. Aortic and branch vessel atherosclerosis. Mild cardiomegaly, without pericardial effusion. Multivessel coronary artery atherosclerosis. No aortic laceration or mediastinal hematoma. Mediastinum/Nodes: No mediastinal or hilar adenopathy. Lungs/Pleura: No pleural fluid. Minimal left pleural thickening. No pneumothorax. No pulmonary contusion or consolidation. Subpleural interstitial prominence is suspicious for mild interstitial lung disease. Musculoskeletal: Degenerate changes of both glenohumeral joints, worse on the right. CT ABDOMEN PELVIS FINDINGS Hepatobiliary: No focal liver lesion. Cholecystectomy. Mild intrahepatic biliary duct dilatation. The common duct measures 1.4 cm in the porta hepatis on coronal image 54. A distal common duct stent originates at the level of the pancreatic head and terminates at the inferior genu of the duodenum. Pancreas: Normal pancreas, without duct dilatation or acute inflammation. Spleen: Normal in size, without focal abnormality. Adrenals/Urinary Tract: Normal right adrenal gland. Mild left adrenal thickening. Bilateral too small to characterize renal lesions. Left renal upper pole cysts. Mild bladder distension. Stomach/Bowel: Normal stomach, without wall thickening. Surgical sutures within the rectum. Normal terminal ileum. Normal small bowel. Vascular/Lymphatic: Advanced aortic and branch vessel atherosclerosis. Infrarenal abdominal aortic ectasia at 3.1  cm. Distal aortic/left common iliac artery stent graft. No abdominopelvic adenopathy. Reproductive: Mild prostatomegaly. Other: No significant free fluid. Musculoskeletal: Mild osteopenia. Presumed bone island in the right iliac. Lumbosacral spondylosis. IMPRESSION: 1. No acute or posttraumatic deformity identified. 2. Stent within the distal common duct, terminating at the inferior genu of the duodenum. Intra and extrahepatic biliary duct dilatation above the stent. Correlate with bilirubin levels. The stent may need to be repositioned or replaced to traverse the presumed area of stricture. 3. Coronary artery atherosclerosis. Aortic Atherosclerosis (ICD10-I70.0). 4. Infrarenal abdominal aortic ectasia at 3.1 cm. Recommend followup by ultrasound in 3 years. This recommendation follows ACR consensus guidelines: White Paper of the ACR Incidental Findings Committee II on Vascular Findings. J Am Coll Radiol 2013; 10:789-794. Aortic aneurysm NOS (ICD10-I71.9). Electronically Signed   By: KAbigail MiyamotoM.D.   On: 12/10/2018 18:16   Dg Chest Port 1 View  Result Date: 12/10/2018 CLINICAL DATA:  Vomiting. Fall. Hypertension. Pacemaker. Ex-smoker. EXAM: PORTABLE CHEST 1 VIEW COMPARISON:  03/30/2018 report. 03/27/2018 images and report. FINDINGS: Dual lead pacer. Midline trachea. Moderate cardiomegaly. Atherosclerosis in the transverse aorta. Right costophrenic angle minimally excluded. No pleural effusion or pneumothorax. No congestive failure. Suggestion of mild hyperinflation with interstitial thickening. No lobar consolidation. IMPRESSION: Cardiomegaly without congestive failure. Mild hyperinflation. Aortic Atherosclerosis (ICD10-I70.0).  In Electronically Signed   By: KAbigail MiyamotoM.D.   On: 12/10/2018 16:59   [place]Abdomen Limited Ruq  Result Date: 12/10/2018 CLINICAL DATA:  85y/o  M; elevated liver function test. EXAM: ULTRASOUND ABDOMEN LIMITED RIGHT UPPER QUADRANT COMPARISON:  None. FINDINGS: Gallbladder:  Cholecystectomy. Common  bile duct: Diameter: 11.7 mm Liver: Intrahepatic biliary ductal dilatation. No focal liver abnormality identified. Portal vein is patent on color Doppler imaging with normal direction of blood flow towards the liver. IMPRESSION: Intra and extrahepatic biliary ductal dilatation as seen on prior CT of abdomen and pelvis. The distal common bile duct is not visible due to bowel gas shadow. Electronically Signed   By: Kristine Garbe M.D.   On: 12/10/2018 21:53     IMPRESSION:   *   Abnormal LFTs in pt with hx (biliary vs med induced?) pancreatitis and previous placement of CBD stent.  ? Stent occlusion.    *   SDH, SAH post fall.  Hx frequent falls  *   Remote colectomy for colon cancer in 2006.    *   Elevated Troponins and BNP.  No cards consult  Previous AV repair, previous pacemaker for AV block.      PLAN:     *   ERCP?  *  Spoke with pts son Andrew Soto.  He is aware that there is stent in place but not sure why, or when placed.  He is going to reach out to the [place] MDs and have them fax discharge summaries, ERCP report (and colonoscopy report if he did have one post surgery.     Azucena Freed  12/11/2018, 11:19 AM Phone [phone]  ________________________________________________________________________  [facility] MD note:  I personally examined the patient, reviewed the data and agree with the assessment and plan described above.  He fell and has suffered a small SDH.  His cardiac enzymes are elevated.  Liver tests are also elevated.  CT shows he has a stent that probably resides in his distal CBD but has likely migrated out of ideal position. He doesn't recall having ERCP.  Family is not sure why he has a stent in place. We will try to get records from [place].  He will probably need repeat ERCP here and given the very elevated cardiac enzymes I think cardiology input would be helpful.  For now, follow LFTs.     Owens Loffler, MD [facility] Pager [phone]   Addendum: My PA spoke with cardiology and they did not feel he needed a full consult.  Will plan on ERCP tomorrow, currently scheduled for 0730.  I spoke with his son Andrew Soto who understands and agrees.  The patient usually signs his own consent.

## 2018-12-11 NOTE — Telephone Encounter (Signed)
Patient is currently admitted at the hospital Wait to schedule hospital follow-up after discharge

## 2018-12-11 NOTE — H&P (View-Only) (Signed)
[facility] Consult: 11:19 AM 12/11/2018  LOS: 1 day    Referring Provider: Dr Tawanna Solo  Primary Care Physician:  Trixie Dredge, PA-C in [place].  Primary Gastroenterologist:  unassigned     Reason for Consultation:  Elevated LFTs and dilated bile ducts.     HPI: Andrew Soto. is a 85 y.o. male.  PMH arrhythmia.  AV block, pacemaker in place.  Heart valve repair.  CVA, left-sided weakness.  Decubitus of buttock.  Scalp laceration.  Balance and gait instability, Frequent falls.  Syncopal spells.  Colon cancer with resection in 2006, did not require radiation or chemotherapy.  Patient denies subsequent follow-up, surveillance colonoscopy after that. Multiple bouts of pancreatitis of unknown clear etiology.  Patient is a poor historian, son thinks bouts began 110 or 2016.  All of these occurred while he was living in [place].  He was admitted to [facility] chole[facility] 2018 or 2019. Pt also says a later MD flet Atorvastatin may have caused pancreatitis and med switched to Zetia.  Patient has no recall of ERCP or biliary stent placement.  Patient tells me he moved to [facility] from [place] in 11/2017.  He is living with his son and his son's family.  Admitted yesterday after fall at home, lethargy and non-bloody vomiting. Hypertensive urgency in ED.  Imaging confirms large right SDH with left shift, smaller left SDH, SAH on right.  No need for neurosurgical intervention at present.  Elevated Troponins and BNP.    LFTs elevated.  t bili 2.4, alk phos 701, AST/ALT 98/154.  These normal 08/14/18. "Mild" acute pancreatitis in Feb 2020 according to a MD office visit note CTAP with contrast: Stent in distal CBD.  Dilated intra and extrahepatic  ducts.  1.4 cm CBD ? Need to reposition the stent.  CAD.  Infrarenal abdominal aortic ectasia.     Pt denies abdominal pain, anorexia.  Only nausea, vomiting was yesterday. Says wt is pretty stable.  Drinks small glass red wine daily.      Past Medical History:  Diagnosis Date   Actinic keratoses    Aortic stenosis    Atrial fibrillation (HCC)    CVA (cerebral vascular accident) ([place])    High cholesterol    History of colon cancer    History of CVA (cerebrovascular accident)    Hypertension    Lower urinary tract symptoms (LUTS)    Pancreatitis, recurrent     Past Surgical History:  Procedure Laterality Date   ABDOMINAL SURGERY     CHOLECYSTECTOMY     COLON RESECTION     MOHS SURGERY     Right eyebrow   PACEMAKER IMPLANT     VALVE REPLACEMENT      Prior to Admission medications   Medication Sig Start Date End Date Taking? Authorizing Provider  AMBULATORY NON FORMULARY MEDICATION Motorized wheelchair. Disp 1 R26.9 08/22/18   Gregor Hams, MD  amiodarone (PACERONE) 200 MG tablet TAKE 1 TABLET BY MOUTH EVERY DAY 10/05/18   Maisie Fus,  Elson Areas, PA-C  aspirin 81 MG tablet Take 81 mg by mouth daily.     [provider]  Cholecalciferol (VITAMIN D3 PO) Take 1 tablet by mouth daily.    [provider]  cyanocobalamin ([employer] VITAMIN B12) 1000 MCG tablet Take 1,000 mcg by mouth daily.     [provider]  DULoxetine (CYMBALTA) 20 MG capsule Take 2 capsules (40 mg total) by mouth daily. 08/15/18   Gregor Hams, MD  ezetimibe (ZETIA) 10 MG tablet TAKE 1 TABLET BY MOUTH EVERY DAY 10/09/18   Trixie Dredge, PA-C  famotidine (PEPCID) 20 MG tablet Take 1 tablet (20 mg total) by mouth 2 (two) times daily as needed for heartburn or indigestion. 09/04/18   Trixie Dredge, PA-C  ketoconazole (NIZORAL) 2 % shampoo  06/10/18   [provider]  NONFORMULARY OR COMPOUNDED ITEM Gel donut cushion 09/04/18   Trixie Dredge, [place]  NONFORMULARY OR COMPOUNDED ITEM Gel mattress topper for pressure relief 09/04/18   Trixie Dredge, PA-C  tamsulosin (FLOMAX) 0.4 MG CAPS capsule TAKE 1 CAPSULE BY MOUTH EVERY DAY 07/17/18   Trixie Dredge, PA-C  valsartan (DIOVAN) 80 MG tablet Take 1 tablet (80 mg total) by mouth daily. 10/25/18   Lelon Perla, MD    Scheduled Meds:  irbesartan  75 mg Oral Daily   polyethylene glycol  17 g Oral Daily   Infusions:  dextrose 5 % and 0.45% NaCl 50 mL/hr at 12/11/18 0900   PRN Meds: labetalol, ondansetron **OR** ondansetron (ZOFRAN) IV   Allergies as of 12/10/2018 - Review Complete 12/10/2018  Allergen Reaction Noted   Epinephrine Other (See Comments) 12/27/2017   Penicillins Rash 12/27/2017   Vicodin [hydrocodone-acetaminophen] Nausea Only 07/12/2018    Family History  Problem Relation Age of Onset   Testicular cancer Father     Social History   Socioeconomic History   Marital status: Widowed    Spouse name: Not on file   Number of children: 6   Years of education: Not on file   Highest education level: Not on file  Occupational History   Not on file  Social Needs   Financial resource strain: Not on file   Food insecurity    Worry: Not on file    Inability: Not on file   Transportation needs    Medical: Not on file    Non-medical: Not on file  Tobacco Use   Smoking status: Former Smoker    Packs/day: 1.00    Years: 5.00    Pack years: 5.00    Types: Cigarettes   Smokeless tobacco: Never Used  Substance and Sexual Activity   Alcohol use: Yes    Comment: glass wine daily   Drug use: Never   Sexual activity: Not Currently    Birth control/protection: Abstinence  Lifestyle   Physical activity    Days per week: Not on file    Minutes per session: Not on file   Stress: Not on file  Relationships   Social connections    Talks on phone: Not on file    Gets together: Not on file     Attends religious service: Not on file    Active member of club or organization: Not on file    Attends meetings of clubs or organizations: Not on file    Relationship status: Not on file   Intimate partner violence    Fear of current or ex partner: Not on file  Emotionally abused: Not on file    Physically abused: Not on file    Forced sexual activity: Not on file  Other Topics Concern   Not on file  Social History Narrative   Not on file    REVIEW OF SYSTEMS: Constitutional:  Weakness and lots of falls ENT:  No nose bleeds Pulm:  No DOB or cough but not active CV:  No palpitations, no LE edema.  GU:  No hematuria, no frequency GI:  Per HPI.  No heartburn or dysphagia.  Constipation, BM's ~ 2 x week.  Uses dulcolax and colace prn.   Heme:  Bruises easily, no unusual bleeding   Transfusions:  None.   Neuro:  + headache.  No peripheral tingling or numbness Derm:  No itching, no rash.  + sores from skin tears.   Endocrine:  No sweats or chills.  No polyuria or dysuria Immunization:  Not queried.   Travel:  None beyond local counties in last few months.    PHYSICAL EXAM: Vital signs in last 24 hours: Vitals:   12/11/18 0700 12/11/18 0759  BP: (!) 151/45 (!) 147/46  Pulse: 60 61  Resp: 17 20  Temp:  98.2 F (36.8 C)  SpO2: 99% 97%   Wt Readings from Last 3 Encounters:  12/10/18 63.9 kg  10/25/18 68 kg  09/04/18 65.8 kg    General: frail, thin, chronically ill looking. Head:  No obvious head trauma.  Facial rubor  Eyes:  No icterus or pallor Ears:  Slightly HPH  Nose:  No congestion or discharge Mouth:  Oral mucosa pink, clear, slightly dry.  Tongue midline.  Poor dentition Neck:  No mass, no JVD Lungs:  Crackles in bases, slight dyspnea when speaking.  + wet cough Heart: RRR with slight syst murmer Abdomen:  Soft, NT, ND.  No HSM, masses, bruits.  Well healed midline scar..   Rectal: deferred   Musc/Skeltl: no joint redness.  Arthritic deformity in fingers.   Kyphosis. Extremities:  No CCE  Neurologic:  Oriented to place, time, self, situation.  Rambling historian with multiple digressive stories, difficult to stay on point, needed redirecting to gain accurate history.     Skin:  Multiple purpura on arms.  Nodes:  No cervical adenopathy   Psych:  Cooperative, affect flat.    Intake/Output from previous day: 07/05 0701 - 07/06 0700 In: 360.8 [I.V.:360.8] Out: 200 [Urine:200] Intake/Output this shift: Total I/O In: 339.9 [P.O.:240; I.V.:99.9] Out: 275 [Urine:275]  LAB RESULTS: Recent Labs    12/10/18 1425 12/11/18 0239  WBC 9.8 12.3*  HGB 13.7 13.0  HCT 41.6 39.1  PLT 232 235   BMET Lab Results  Component Value Date   NA 133 (L) 12/11/2018   NA 132 (L) 12/10/2018   NA 135 11/03/2018   K 4.1 12/11/2018   K 4.2 12/10/2018   K 4.4 11/03/2018   CL 97 (L) 12/11/2018   CL 100 12/10/2018   CL 98 11/03/2018   CO2 24 12/11/2018   CO2 23 12/10/2018   CO2 26 11/03/2018   GLUCOSE 160 (H) 12/11/2018   GLUCOSE 143 (H) 12/10/2018   GLUCOSE 129 (H) 11/03/2018   BUN 13 12/11/2018   BUN 14 12/10/2018   BUN 17 11/03/2018   CREATININE 0.78 12/11/2018   CREATININE 0.70 12/10/2018   CREATININE 0.92 11/03/2018   CALCIUM 9.0 12/11/2018   CALCIUM 9.0 12/10/2018   CALCIUM 9.7 11/03/2018   LFT Recent Labs    12/10/18 1425 12/11/18 0239  PROT 5.8* 5.9*  ALBUMIN 3.1* 3.0*  AST 98* 66*  ALT 154* 129*  ALKPHOS 701* 660*  BILITOT 2.4* 1.7*   PT/INR No results found for: INR, PROTIME Hepatitis Panel No results for input(s): HEPBSAG, HCVAB, HEPAIGM, HEPBIGM in the last 72 hours. C-Diff No components found for: CDIFF Lipase     Component Value Date/Time   LIPASE 26 08/14/2018 1026    Drugs of Abuse  No results found for: LABOPIA, COCAINSCRNUR, LABBENZ, AMPHETMU, THCU, LABBARB   RADIOLOGY STUDIES: Ct Head Wo Contrast  Result Date: 12/10/2018 CLINICAL DATA:  Posttraumatic headache after fall. EXAM: CT HEAD WITHOUT CONTRAST CT  CERVICAL SPINE WITHOUT CONTRAST TECHNIQUE: Multidetector CT imaging of the head and cervical spine was performed following the standard protocol without intravenous contrast. Multiplanar CT image reconstructions of the cervical spine were also generated. COMPARISON:  CT scan of March 27, 2018. FINDINGS: CT HEAD FINDINGS Brain: Ventricular size is within normal limits. Mild chronic ischemic white matter disease is noted. Mild diffuse cortical atrophy is noted. Large subdural hematoma is seen overlying the right cerebral cortex and right tentorial region. Subdural hemorrhage is also noted in the right parafalcine region. This results in 5 mm of right to left midline shift. Maximum thickness of 15 mm is noted. Focus of subarachnoid hemorrhage is noted in right parietal cortex. Small left subdural hematoma is seen overlying left parietal cortex. Vascular: No hyperdense vessel or unexpected calcification. Skull: Normal. Negative for fracture or focal lesion. Sinuses/Orbits: No acute finding. Other: None. CT CERVICAL SPINE FINDINGS Alignment: Grade 1 retrolisthesis of C5-6 is noted secondary to severe degenerative disc disease at this level. Grade 1 anterolisthesis of C7-T1 is noted secondary to posterior facet joint hypertrophy. Skull base and vertebrae: No acute fracture. No primary bone lesion or focal pathologic process. Soft tissues and spinal canal: No prevertebral fluid or swelling. No visible canal hematoma. Disc levels: Severe degenerative disc disease is noted at C3-4, C4-5, C5-6, C6-7 and C7-T1. Upper chest: Negative. Other: Degenerative changes are seen involving posterior facet joints bilaterally. IMPRESSION: Large subdural hematoma is seen overlying right cerebral cortex and right tentorial region, resulting in 5 mm of right to left midline shift. Demonstrates a maximum thickness of 15 mm. Focus of subarachnoid hemorrhage is noted in the right parietal cortex. Small left subdural hematoma is seen overlying  left parietal cortex. Critical Value/emergent results were called by telephone at the time of interpretation on 12/10/2018 at 3:45 pm to Dr. Quintella Reichert , who verbally acknowledged these results. Severe multilevel degenerative disc disease is noted in the cervical spine. No fracture or acute abnormality is noted. Electronically Signed   By: Marijo Conception M.D.   On: 12/10/2018 15:46   Ct Chest W Contrast  Result Date: 12/10/2018 CLINICAL DATA:  Abdominal trauma.  Vomiting.  Head bleed. EXAM: CT CHEST, ABDOMEN, AND PELVIS WITH CONTRAST TECHNIQUE: Multidetector CT imaging of the chest, abdomen and pelvis was performed following the standard protocol during bolus administration of intravenous contrast. CONTRAST:  187m OMNIPAQUE IOHEXOL 300 MG/ML  SOLN COMPARISON:  Chest radiograph of earlier in the day.  No prior CTs. FINDINGS: CT CHEST FINDINGS Cardiovascular: Pacer. Aortic valve repair. Aortic and branch vessel atherosclerosis. Mild cardiomegaly, without pericardial effusion. Multivessel coronary artery atherosclerosis. No aortic laceration or mediastinal hematoma. Mediastinum/Nodes: No mediastinal or hilar adenopathy. Lungs/Pleura: No pleural fluid. Minimal left pleural thickening. No pneumothorax. No pulmonary contusion or consolidation. Subpleural interstitial prominence is suspicious for mild interstitial lung disease. Musculoskeletal: Degenerate changes of  both glenohumeral joints, worse on the right. CT ABDOMEN PELVIS FINDINGS Hepatobiliary: No focal liver lesion. Cholecystectomy. Mild intrahepatic biliary duct dilatation. The common duct measures 1.4 cm in the porta hepatis on coronal image 54. A distal common duct stent originates at the level of the pancreatic head and terminates at the inferior genu of the duodenum. Pancreas: Normal pancreas, without duct dilatation or acute inflammation. Spleen: Normal in size, without focal abnormality. Adrenals/Urinary Tract: Normal right adrenal gland. Mild left  adrenal thickening. Bilateral too small to characterize renal lesions. Left renal upper pole cysts. Mild bladder distension. Stomach/Bowel: Normal stomach, without wall thickening. Surgical sutures within the rectum. Normal terminal ileum. Normal small bowel. Vascular/Lymphatic: Advanced aortic and branch vessel atherosclerosis. Infrarenal abdominal aortic ectasia at 3.1 cm. Distal aortic/left common iliac artery stent graft. No abdominopelvic adenopathy. Reproductive: Mild prostatomegaly. Other: No significant free fluid. Musculoskeletal: Mild osteopenia. Presumed bone island in the right iliac. Lumbosacral spondylosis. IMPRESSION: 1. No acute or posttraumatic deformity identified. 2. Stent within the distal common duct, terminating at the inferior genu of the duodenum. Intra and extrahepatic biliary duct dilatation above the stent. Correlate with bilirubin levels. The stent may need to be repositioned or replaced to traverse the presumed area of stricture. 3. Coronary artery atherosclerosis. Aortic Atherosclerosis (ICD10-I70.0). 4. Infrarenal abdominal aortic ectasia at 3.1 cm. Recommend followup by ultrasound in 3 years. This recommendation follows ACR consensus guidelines: White Paper of the ACR Incidental Findings Committee II on Vascular Findings. J Am Coll Radiol 2013; 10:789-794. Aortic aneurysm NOS (ICD10-I71.9). Electronically Signed   By: Abigail Miyamoto M.D.   On: 12/10/2018 18:16   Ct Cervical Spine Wo Contrast  Result Date: 12/10/2018 CLINICAL DATA:  Posttraumatic headache after fall. EXAM: CT HEAD WITHOUT CONTRAST CT CERVICAL SPINE WITHOUT CONTRAST TECHNIQUE: Multidetector CT imaging of the head and cervical spine was performed following the standard protocol without intravenous contrast. Multiplanar CT image reconstructions of the cervical spine were also generated. COMPARISON:  CT scan of March 27, 2018. FINDINGS: CT HEAD FINDINGS Brain: Ventricular size is within normal limits. Mild chronic  ischemic white matter disease is noted. Mild diffuse cortical atrophy is noted. Large subdural hematoma is seen overlying the right cerebral cortex and right tentorial region. Subdural hemorrhage is also noted in the right parafalcine region. This results in 5 mm of right to left midline shift. Maximum thickness of 15 mm is noted. Focus of subarachnoid hemorrhage is noted in right parietal cortex. Small left subdural hematoma is seen overlying left parietal cortex. Vascular: No hyperdense vessel or unexpected calcification. Skull: Normal. Negative for fracture or focal lesion. Sinuses/Orbits: No acute finding. Other: None. CT CERVICAL SPINE FINDINGS Alignment: Grade 1 retrolisthesis of C5-6 is noted secondary to severe degenerative disc disease at this level. Grade 1 anterolisthesis of C7-T1 is noted secondary to posterior facet joint hypertrophy. Skull base and vertebrae: No acute fracture. No primary bone lesion or focal pathologic process. Soft tissues and spinal canal: No prevertebral fluid or swelling. No visible canal hematoma. Disc levels: Severe degenerative disc disease is noted at C3-4, C4-5, C5-6, C6-7 and C7-T1. Upper chest: Negative. Other: Degenerative changes are seen involving posterior facet joints bilaterally. IMPRESSION: Large subdural hematoma is seen overlying right cerebral cortex and right tentorial region, resulting in 5 mm of right to left midline shift. Demonstrates a maximum thickness of 15 mm. Focus of subarachnoid hemorrhage is noted in the right parietal cortex. Small left subdural hematoma is seen overlying left parietal cortex. Critical Value/emergent results were called  by telephone at the time of interpretation on 12/10/2018 at 3:45 pm to Dr. Quintella Reichert , who verbally acknowledged these results. Severe multilevel degenerative disc disease is noted in the cervical spine. No fracture or acute abnormality is noted. Electronically Signed   By: Marijo Conception M.D.   On: 12/10/2018  15:46   Ct Abdomen Pelvis W Contrast  Result Date: 12/10/2018 CLINICAL DATA:  Abdominal trauma.  Vomiting.  Head bleed. EXAM: CT CHEST, ABDOMEN, AND PELVIS WITH CONTRAST TECHNIQUE: Multidetector CT imaging of the chest, abdomen and pelvis was performed following the standard protocol during bolus administration of intravenous contrast. CONTRAST:  170m OMNIPAQUE IOHEXOL 300 MG/ML  SOLN COMPARISON:  Chest radiograph of earlier in the day.  No prior CTs. FINDINGS: CT CHEST FINDINGS Cardiovascular: Pacer. Aortic valve repair. Aortic and branch vessel atherosclerosis. Mild cardiomegaly, without pericardial effusion. Multivessel coronary artery atherosclerosis. No aortic laceration or mediastinal hematoma. Mediastinum/Nodes: No mediastinal or hilar adenopathy. Lungs/Pleura: No pleural fluid. Minimal left pleural thickening. No pneumothorax. No pulmonary contusion or consolidation. Subpleural interstitial prominence is suspicious for mild interstitial lung disease. Musculoskeletal: Degenerate changes of both glenohumeral joints, worse on the right. CT ABDOMEN PELVIS FINDINGS Hepatobiliary: No focal liver lesion. Cholecystectomy. Mild intrahepatic biliary duct dilatation. The common duct measures 1.4 cm in the porta hepatis on coronal image 54. A distal common duct stent originates at the level of the pancreatic head and terminates at the inferior genu of the duodenum. Pancreas: Normal pancreas, without duct dilatation or acute inflammation. Spleen: Normal in size, without focal abnormality. Adrenals/Urinary Tract: Normal right adrenal gland. Mild left adrenal thickening. Bilateral too small to characterize renal lesions. Left renal upper pole cysts. Mild bladder distension. Stomach/Bowel: Normal stomach, without wall thickening. Surgical sutures within the rectum. Normal terminal ileum. Normal small bowel. Vascular/Lymphatic: Advanced aortic and branch vessel atherosclerosis. Infrarenal abdominal aortic ectasia at 3.1  cm. Distal aortic/left common iliac artery stent graft. No abdominopelvic adenopathy. Reproductive: Mild prostatomegaly. Other: No significant free fluid. Musculoskeletal: Mild osteopenia. Presumed bone island in the right iliac. Lumbosacral spondylosis. IMPRESSION: 1. No acute or posttraumatic deformity identified. 2. Stent within the distal common duct, terminating at the inferior genu of the duodenum. Intra and extrahepatic biliary duct dilatation above the stent. Correlate with bilirubin levels. The stent may need to be repositioned or replaced to traverse the presumed area of stricture. 3. Coronary artery atherosclerosis. Aortic Atherosclerosis (ICD10-I70.0). 4. Infrarenal abdominal aortic ectasia at 3.1 cm. Recommend followup by ultrasound in 3 years. This recommendation follows ACR consensus guidelines: White Paper of the ACR Incidental Findings Committee II on Vascular Findings. J Am Coll Radiol 2013; 10:789-794. Aortic aneurysm NOS (ICD10-I71.9). Electronically Signed   By: KAbigail MiyamotoM.D.   On: 12/10/2018 18:16   Dg Chest Port 1 View  Result Date: 12/10/2018 CLINICAL DATA:  Vomiting. Fall. Hypertension. Pacemaker. Ex-smoker. EXAM: PORTABLE CHEST 1 VIEW COMPARISON:  03/30/2018 report. 03/27/2018 images and report. FINDINGS: Dual lead pacer. Midline trachea. Moderate cardiomegaly. Atherosclerosis in the transverse aorta. Right costophrenic angle minimally excluded. No pleural effusion or pneumothorax. No congestive failure. Suggestion of mild hyperinflation with interstitial thickening. No lobar consolidation. IMPRESSION: Cardiomegaly without congestive failure. Mild hyperinflation. Aortic Atherosclerosis (ICD10-I70.0).  In Electronically Signed   By: KAbigail MiyamotoM.D.   On: 12/10/2018 16:59   [place]Abdomen Limited Ruq  Result Date: 12/10/2018 CLINICAL DATA:  85y/o  M; elevated liver function test. EXAM: ULTRASOUND ABDOMEN LIMITED RIGHT UPPER QUADRANT COMPARISON:  None. FINDINGS: Gallbladder:  Cholecystectomy. Common  bile duct: Diameter: 11.7 mm Liver: Intrahepatic biliary ductal dilatation. No focal liver abnormality identified. Portal vein is patent on color Doppler imaging with normal direction of blood flow towards the liver. IMPRESSION: Intra and extrahepatic biliary ductal dilatation as seen on prior CT of abdomen and pelvis. The distal common bile duct is not visible due to bowel gas shadow. Electronically Signed   By: Kristine Garbe M.D.   On: 12/10/2018 21:53     IMPRESSION:   *   Abnormal LFTs in pt with hx (biliary vs med induced?) pancreatitis and previous placement of CBD stent.  ? Stent occlusion.    *   SDH, SAH post fall.  Hx frequent falls  *   Remote colectomy for colon cancer in 2006.    *   Elevated Troponins and BNP.  No cards consult  Previous AV repair, previous pacemaker for AV block.      PLAN:     *   ERCP?  *  Spoke with pts son Andrew Soto.  He is aware that there is stent in place but not sure why, or when placed.  He is going to reach out to the [place] MDs and have them fax discharge summaries, ERCP report (and colonoscopy report if he did have one post surgery.     Azucena Freed  12/11/2018, 11:19 AM Phone [phone]  ________________________________________________________________________  [facility] MD note:  I personally examined the patient, reviewed the data and agree with the assessment and plan described above.  He fell and has suffered a small SDH.  His cardiac enzymes are elevated.  Liver tests are also elevated.  CT shows he has a stent that probably resides in his distal CBD but has likely migrated out of ideal position. He doesn't recall having ERCP.  Family is not sure why he has a stent in place. We will try to get records from [place].  He will probably need repeat ERCP here and given the very elevated cardiac enzymes I think cardiology input would be helpful.  For now, follow LFTs.     Owens Loffler, MD [facility] Pager [phone]   Addendum: My PA spoke with cardiology and they did not feel he needed a full consult.  Will plan on ERCP tomorrow, currently scheduled for 0730.  I spoke with his son Andrew Soto who understands and agrees.  The patient usually signs his own consent.

## 2018-12-11 NOTE — Progress Notes (Signed)
PROGRESS NOTE    Andrew Soto.  ZOX:[id number] DOB: 26-Jun-1931 DOA: 12/10/2018 PCP: Carlis Stable, PA-C   Brief Narrative:  Patient is 85 year old male with history of CVA, atrial fibrillation, aortic stenosis, hyperlipidemia, colon cancer, hypertension, recurrent gallstone pancreatitis, recurrent UTI who presented to the emergency department after a fall.  CT head showedLarge subdural hematoma overlying right cerebral cortex and right tentorial region, resulting in 5 mm of right to left midline shift. CT of abdomen and pelvis showed stent within the distal common duct, terminating at the inferior genu of the duodenum. Intra and extrahepatic biliary duct dilatation above the stent.  He was found to have elevated ALP, ALT and AST.  Complaining of some abdominal pain on presentation.  Neurosurgery, GI consulted.  No plan for intervention for subdural hematoma.  Assessment & Plan:   Principal Problem:   SDH (subdural hematoma) (HCC) Active Problems:   Pancreatitis, recurrent   History of CVA (cerebrovascular accident)   Hypertension goal BP (blood pressure) < 140/90   Atrial fibrillation (HCC)   Depression   Multiple falls   Gallstone of bile duct with obstruction  Intracranial hemorrhage: CT finding as above.  Neurosurgery was consulted.  No plan for intervention.  Continue to monitor mental status, frequent neurological checks.  Monitor blood pressure.  No need for scheduled repeat imaging as per neurosurgery.  Elevated liver enzymes/dilated biliary ducts: CT finding as above.  He had biliary stent placed in the past but patient does not remember the exact details. GI following.  He was complaining of abdomen pain on presentation.  This morning he was not complaining of any abdominal pain.  His abdomen is soft and has mild generalized tenderness.  We will continue to monitor liver enzymes.  Hypertensive urgency: Continue PRN meds.  Will start his home antihypertensives.   Monitor blood pressure  History of CVAs: On aspirin at home which has been on hold.  History of recurrent pancreatitis: Suspected to be from statin.  Imaging does not show any pancreatic inflammation.  Abdominal pain has resolved.  History of paroxysmal A. fib: Currently in normal sinus rhythm.  Not on anticoagulation.  On amiodarone at home.  Hyperlipidemia: On Zetia  History of depression/anxiety: We will resume his home regimen  Frequent falls: Physical therapy consulted.  Elevated BNP/troponin: Very nonspecific.  No sign of heart failure.  Patient denies any chest pain.  No indication for further intervention           DVT prophylaxis: SCD Code Status: Full Family Communication: Called son.Phone not received. Voice mail left Disposition Plan: Home in 1 to 2 days.  Waiting for PT evaluation.   Consultants: Neurosurgery, GI  Procedures: None  Antimicrobials:  Anti-infectives (From admission, onward)   None      Subjective:  Patient seen and examined the bedside this morning.  Looks comfortable.  Alert and oriented.  Hemodynamically stable.  Denies any headache, nausea or vomiting.  Abdominal pain has improved.  Objective: Vitals:   12/11/18 0600 12/11/18 0700 12/11/18 0759 12/11/18 1132  BP: (!) 149/46 (!) 151/45 (!) 147/46 (!) 152/45  Pulse: (!) 58 60 61 (!) 57  Resp: Temp:   98.2 F (36.8 C) 98.2 F (36.8 C)  TempSrc:   Oral Oral  SpO2: 99% 99% 97%   Weight:      Height:        Intake/Output Summary (Last 24 hours) at 12/11/2018 1358 Last data filed at 12/11/2018 1300  Gross per 24 hour  Intake 900.7 ml  Output 575 ml  Net 325.7 ml   Filed Weights   12/10/18 1325 12/10/18 2357  Weight: 72.6 kg 63.9 kg    Examination:  General exam: Not in distress, debilitated elderly gentleman,  HEENT:PERRL,Oral mucosa moist, Ear/Nose normal on gross exam Respiratory system: Bilateral equal air entry, normal vesicular breath sounds, no wheezes or  crackles  Cardiovascular system: S1 & S2 heard, RRR. No JVD, murmurs, rubs, gallops or clicks. No pedal edema. Gastrointestinal system: Abdomen is nondistended, soft . No organomegaly or masses felt. Normal bowel sounds heard.  Mild generalized tenderness Central nervous system: Alert and oriented. No focal neurological deficits. Extremities: No edema, no clubbing ,no cyanosis, distal peripheral pulses palpable. Skin: No rashes, lesions or ulcers,no icterus ,no pallor    Data Reviewed: I have personally reviewed following labs and imaging studies  CBC: Recent Labs  Lab 12/10/18 1425 12/11/18 0239  WBC 9.8 12.3*  NEUTROABS 8.6*  --   HGB 13.7 13.0  HCT 41.6 39.1  MCV 101.2* 99.2  PLT 232 235   Basic Metabolic Panel: Recent Labs  Lab 12/10/18 1425 12/11/18 0239  NA 132* 133*  K 4.2 4.1  CL 100 97*  CO2 23 24  GLUCOSE 143* 160*  BUN 14 13  CREATININE 0.70 0.78  CALCIUM 9.0 9.0   GFR: Estimated Creatinine Clearance: 60.9 mL/min (by C-G formula based on SCr of 0.78 mg/dL). Liver Function Tests: Recent Labs  Lab 12/10/18 1425 12/11/18 0239  AST 98* 66*  ALT 154* 129*  ALKPHOS 701* 660*  BILITOT 2.4* 1.7*  PROT 5.8* 5.9*  ALBUMIN 3.1* 3.0*   No results for input(s): LIPASE, AMYLASE in the last 168 hours. No results for input(s): AMMONIA in the last 168 hours. Coagulation Profile: No results for input(s): INR, PROTIME in the last 168 hours. Cardiac Enzymes: No results for input(s): CKTOTAL, CKMB, CKMBINDEX, TROPONINI in the last 168 hours. BNP (last 3 results) No results for input(s): PROBNP in the last 8760 hours. HbA1C: No results for input(s): HGBA1C in the last 72 hours. CBG: Recent Labs  Lab 12/10/18 1322  GLUCAP 136*   Lipid Profile: No results for input(s): CHOL, HDL, LDLCALC, TRIG, CHOLHDL, LDLDIRECT in the last 72 hours. Thyroid Function Tests: No results for input(s): TSH, T4TOTAL, FREET4, T3FREE, THYROIDAB in the last 72 hours. Anemia  Panel: No results for input(s): VITAMINB12, FOLATE, FERRITIN, TIBC, IRON, RETICCTPCT in the last 72 hours. Sepsis Labs: No results for input(s): PROCALCITON, LATICACIDVEN in the last 168 hours.  Recent Results (from the past 240 hour(s))  SARS Coronavirus 2 (CEPHEID - Performed in [facility] lab), Hosp Order     Status: None   Collection Time: 12/10/18  3:51 PM   Specimen: Nasopharyngeal Swab  Result Value Ref Range Status   SARS Coronavirus 2 NEGATIVE NEGATIVE Final    Comment: (NOTE) If result is NEGATIVE SARS-CoV-2 target nucleic acids are NOT DETECTED. The SARS-CoV-2 RNA is generally detectable in upper and lower  respiratory specimens during the acute phase of infection. The lowest  concentration of SARS-CoV-2 viral copies this assay can detect is 250  copies / mL. A negative result does not preclude SARS-CoV-2 infection  and should not be used as the sole basis for treatment or other  patient management decisions.  A negative result may occur with  improper specimen collection / handling, submission of specimen other  than nasopharyngeal swab, presence of viral mutation(s) within the  areas  targeted by this assay, and inadequate number of viral copies  (<250 copies / mL). A negative result must be combined with clinical  observations, patient history, and epidemiological information. If result is POSITIVE SARS-CoV-2 target nucleic acids are DETECTED. The SARS-CoV-2 RNA is generally detectable in upper and lower  respiratory specimens dur ing the acute phase of infection.  Positive  results are indicative of active infection with SARS-CoV-2.  Clinical  correlation with patient history and other diagnostic information is  necessary to determine patient infection status.  Positive results do  not rule out bacterial infection or co-infection with other viruses. If result is PRESUMPTIVE POSTIVE SARS-CoV-2 nucleic acids MAY BE PRESENT.   A presumptive positive result was  obtained on the submitted specimen  and confirmed on repeat testing.  While 2019 novel coronavirus  (SARS-CoV-2) nucleic acids may be present in the submitted sample  additional confirmatory testing may be necessary for epidemiological  and / or clinical management purposes  to differentiate between  SARS-CoV-2 and other Sarbecovirus currently known to infect humans.  If clinically indicated additional testing with an alternate test  methodology [phone]) is advised. The SARS-CoV-2 RNA is generally  detectable in upper and lower respiratory sp ecimens during the acute  phase of infection. The expected result is Negative. Fact Sheet for Patients:  StrictlyIdeas.no Fact Sheet for Healthcare Providers: BankingDealers.co.za This test is not yet approved or cleared by the [place] FDA and has been authorized for detection and/or diagnosis of SARS-CoV-2 by FDA under an Emergency Use Authorization (EUA).  This EUA will remain in effect (meaning this test can be used) for the duration of the COVID-19 declaration under Section 564(b)(1) of the Act, 21 U.S.C. section 360bbb-3(b)(1), unless the authorization is terminated or revoked sooner. Performed at [facility], [place] [address], [place], [place] [address]   Urine culture     Status: None   Collection Time: 12/10/18  6:04 PM   Specimen: Urine, Clean Catch  Result Value Ref Range Status   Specimen Description URINE, CLEAN CATCH  Final   Special Requests NONE  Final   Culture   Final    NO GROWTH Performed at [facility], [place] [address], [place], [place] [address]    Report Status 12/11/2018 FINAL  Final  MRSA PCR Screening     Status: None   Collection Time: 12/11/18  2:45 AM   Specimen: Nasal Mucosa; Nasopharyngeal  Result Value Ref Range Status   MRSA by PCR NEGATIVE NEGATIVE Final    Comment:        The GeneXpert MRSA Assay (FDA approved for NASAL specimens only),  is one component of a comprehensive MRSA colonization surveillance program. It is not intended to diagnose MRSA infection nor to guide or monitor treatment for MRSA infections. Performed at [facility], [place] [address], [place], [place] [address]          Radiology Studies: Ct Head Wo Contrast  Result Date: 12/10/2018 CLINICAL DATA:  Posttraumatic headache after fall. EXAM: CT HEAD WITHOUT CONTRAST CT CERVICAL SPINE WITHOUT CONTRAST TECHNIQUE: Multidetector CT imaging of the head and cervical spine was performed following the standard protocol without intravenous contrast. Multiplanar CT image reconstructions of the cervical spine were also generated. COMPARISON:  CT scan of March 27, 2018. FINDINGS: CT HEAD FINDINGS Brain: Ventricular size is within normal limits. Mild chronic ischemic white matter disease is noted. Mild diffuse cortical atrophy is noted. Large subdural hematoma is seen overlying  the right cerebral cortex and right tentorial region. Subdural hemorrhage is also noted in the right parafalcine region. This results in 5 mm of right to left midline shift. Maximum thickness of 15 mm is noted. Focus of subarachnoid hemorrhage is noted in right parietal cortex. Small left subdural hematoma is seen overlying left parietal cortex. Vascular: No hyperdense vessel or unexpected calcification. Skull: Normal. Negative for fracture or focal lesion. Sinuses/Orbits: No acute finding. Other: None. CT CERVICAL SPINE FINDINGS Alignment: Grade 1 retrolisthesis of C5-6 is noted secondary to severe degenerative disc disease at this level. Grade 1 anterolisthesis of C7-T1 is noted secondary to posterior facet joint hypertrophy. Skull base and vertebrae: No acute fracture. No primary bone lesion or focal pathologic process. Soft tissues and spinal canal: No prevertebral fluid or swelling. No visible canal hematoma. Disc levels: Severe degenerative disc disease is noted at C3-4, C4-5, C5-6, C6-7 and  C7-T1. Upper chest: Negative. Other: Degenerative changes are seen involving posterior facet joints bilaterally. IMPRESSION: Large subdural hematoma is seen overlying right cerebral cortex and right tentorial region, resulting in 5 mm of right to left midline shift. Demonstrates a maximum thickness of 15 mm. Focus of subarachnoid hemorrhage is noted in the right parietal cortex. Small left subdural hematoma is seen overlying left parietal cortex. Critical Value/emergent results were called by telephone at the time of interpretation on 12/10/2018 at 3:45 pm to Dr. Tilden FossaELIZABETH REES , who verbally acknowledged these results. Severe multilevel degenerative disc disease is noted in the cervical spine. No fracture or acute abnormality is noted. Electronically Signed   By: Lupita RaiderJames  Green Jr M.D.   On: 12/10/2018 15:46   Ct Chest W Contrast  Result Date: 12/10/2018 CLINICAL DATA:  Abdominal trauma.  Vomiting.  Head bleed. EXAM: CT CHEST, ABDOMEN, AND PELVIS WITH CONTRAST TECHNIQUE: Multidetector CT imaging of the chest, abdomen and pelvis was performed following the standard protocol during bolus administration of intravenous contrast. CONTRAST:  100mL OMNIPAQUE IOHEXOL 300 MG/ML  SOLN COMPARISON:  Chest radiograph of earlier in the day.  No prior CTs. FINDINGS: CT CHEST FINDINGS Cardiovascular: Pacer. Aortic valve repair. Aortic and branch vessel atherosclerosis. Mild cardiomegaly, without pericardial effusion. Multivessel coronary artery atherosclerosis. No aortic laceration or mediastinal hematoma. Mediastinum/Nodes: No mediastinal or hilar adenopathy. Lungs/Pleura: No pleural fluid. Minimal left pleural thickening. No pneumothorax. No pulmonary contusion or consolidation. Subpleural interstitial prominence is suspicious for mild interstitial lung disease. Musculoskeletal: Degenerate changes of both glenohumeral joints, worse on the right. CT ABDOMEN PELVIS FINDINGS Hepatobiliary: No focal liver lesion. Cholecystectomy.  Mild intrahepatic biliary duct dilatation. The common duct measures 1.4 cm in the porta hepatis on coronal image 54. A distal common duct stent originates at the level of the pancreatic head and terminates at the inferior genu of the duodenum. Pancreas: Normal pancreas, without duct dilatation or acute inflammation. Spleen: Normal in size, without focal abnormality. Adrenals/Urinary Tract: Normal right adrenal gland. Mild left adrenal thickening. Bilateral too small to characterize renal lesions. Left renal upper pole cysts. Mild bladder distension. Stomach/Bowel: Normal stomach, without wall thickening. Surgical sutures within the rectum. Normal terminal ileum. Normal small bowel. Vascular/Lymphatic: Advanced aortic and branch vessel atherosclerosis. Infrarenal abdominal aortic ectasia at 3.1 cm. Distal aortic/left common iliac artery stent graft. No abdominopelvic adenopathy. Reproductive: Mild prostatomegaly. Other: No significant free fluid. Musculoskeletal: Mild osteopenia. Presumed bone island in the right iliac. Lumbosacral spondylosis. IMPRESSION: 1. No acute or posttraumatic deformity identified. 2. Stent within the distal common duct, terminating at the inferior genu of the  duodenum. Intra and extrahepatic biliary duct dilatation above the stent. Correlate with bilirubin levels. The stent may need to be repositioned or replaced to traverse the presumed area of stricture. 3. Coronary artery atherosclerosis. Aortic Atherosclerosis (ICD10-I70.0). 4. Infrarenal abdominal aortic ectasia at 3.1 cm. Recommend followup by ultrasound in 3 years. This recommendation follows ACR consensus guidelines: White Paper of the ACR Incidental Findings Committee II on Vascular Findings. J Am Coll Radiol 2013; 10:789-794. Aortic aneurysm NOS (ICD10-I71.9). Electronically Signed   By: Jeronimo GreavesKyle  Talbot M.D.   On: 12/10/2018 18:16   Ct Cervical Spine Wo Contrast  Result Date: 12/10/2018 CLINICAL DATA:  Posttraumatic headache after  fall. EXAM: CT HEAD WITHOUT CONTRAST CT CERVICAL SPINE WITHOUT CONTRAST TECHNIQUE: Multidetector CT imaging of the head and cervical spine was performed following the standard protocol without intravenous contrast. Multiplanar CT image reconstructions of the cervical spine were also generated. COMPARISON:  CT scan of March 27, 2018. FINDINGS: CT HEAD FINDINGS Brain: Ventricular size is within normal limits. Mild chronic ischemic white matter disease is noted. Mild diffuse cortical atrophy is noted. Large subdural hematoma is seen overlying the right cerebral cortex and right tentorial region. Subdural hemorrhage is also noted in the right parafalcine region. This results in 5 mm of right to left midline shift. Maximum thickness of 15 mm is noted. Focus of subarachnoid hemorrhage is noted in right parietal cortex. Small left subdural hematoma is seen overlying left parietal cortex. Vascular: No hyperdense vessel or unexpected calcification. Skull: Normal. Negative for fracture or focal lesion. Sinuses/Orbits: No acute finding. Other: None. CT CERVICAL SPINE FINDINGS Alignment: Grade 1 retrolisthesis of C5-6 is noted secondary to severe degenerative disc disease at this level. Grade 1 anterolisthesis of C7-T1 is noted secondary to posterior facet joint hypertrophy. Skull base and vertebrae: No acute fracture. No primary bone lesion or focal pathologic process. Soft tissues and spinal canal: No prevertebral fluid or swelling. No visible canal hematoma. Disc levels: Severe degenerative disc disease is noted at C3-4, C4-5, C5-6, C6-7 and C7-T1. Upper chest: Negative. Other: Degenerative changes are seen involving posterior facet joints bilaterally. IMPRESSION: Large subdural hematoma is seen overlying right cerebral cortex and right tentorial region, resulting in 5 mm of right to left midline shift. Demonstrates a maximum thickness of 15 mm. Focus of subarachnoid hemorrhage is noted in the right parietal cortex. Small  left subdural hematoma is seen overlying left parietal cortex. Critical Value/emergent results were called by telephone at the time of interpretation on 12/10/2018 at 3:45 pm to Dr. Tilden FossaELIZABETH REES , who verbally acknowledged these results. Severe multilevel degenerative disc disease is noted in the cervical spine. No fracture or acute abnormality is noted. Electronically Signed   By: Lupita RaiderJames  Green Jr M.D.   On: 12/10/2018 15:46   Ct Abdomen Pelvis W Contrast  Result Date: 12/10/2018 CLINICAL DATA:  Abdominal trauma.  Vomiting.  Head bleed. EXAM: CT CHEST, ABDOMEN, AND PELVIS WITH CONTRAST TECHNIQUE: Multidetector CT imaging of the chest, abdomen and pelvis was performed following the standard protocol during bolus administration of intravenous contrast. CONTRAST:  100mL OMNIPAQUE IOHEXOL 300 MG/ML  SOLN COMPARISON:  Chest radiograph of earlier in the day.  No prior CTs. FINDINGS: CT CHEST FINDINGS Cardiovascular: Pacer. Aortic valve repair. Aortic and branch vessel atherosclerosis. Mild cardiomegaly, without pericardial effusion. Multivessel coronary artery atherosclerosis. No aortic laceration or mediastinal hematoma. Mediastinum/Nodes: No mediastinal or hilar adenopathy. Lungs/Pleura: No pleural fluid. Minimal left pleural thickening. No pneumothorax. No pulmonary contusion or consolidation. Subpleural interstitial prominence is  suspicious for mild interstitial lung disease. Musculoskeletal: Degenerate changes of both glenohumeral joints, worse on the right. CT ABDOMEN PELVIS FINDINGS Hepatobiliary: No focal liver lesion. Cholecystectomy. Mild intrahepatic biliary duct dilatation. The common duct measures 1.4 cm in the porta hepatis on coronal image 54. A distal common duct stent originates at the level of the pancreatic head and terminates at the inferior genu of the duodenum. Pancreas: Normal pancreas, without duct dilatation or acute inflammation. Spleen: Normal in size, without focal abnormality.  Adrenals/Urinary Tract: Normal right adrenal gland. Mild left adrenal thickening. Bilateral too small to characterize renal lesions. Left renal upper pole cysts. Mild bladder distension. Stomach/Bowel: Normal stomach, without wall thickening. Surgical sutures within the rectum. Normal terminal ileum. Normal small bowel. Vascular/Lymphatic: Advanced aortic and branch vessel atherosclerosis. Infrarenal abdominal aortic ectasia at 3.1 cm. Distal aortic/left common iliac artery stent graft. No abdominopelvic adenopathy. Reproductive: Mild prostatomegaly. Other: No significant free fluid. Musculoskeletal: Mild osteopenia. Presumed bone island in the right iliac. Lumbosacral spondylosis. IMPRESSION: 1. No acute or posttraumatic deformity identified. 2. Stent within the distal common duct, terminating at the inferior genu of the duodenum. Intra and extrahepatic biliary duct dilatation above the stent. Correlate with bilirubin levels. The stent may need to be repositioned or replaced to traverse the presumed area of stricture. 3. Coronary artery atherosclerosis. Aortic Atherosclerosis (ICD10-I70.0). 4. Infrarenal abdominal aortic ectasia at 3.1 cm. Recommend followup by ultrasound in 3 years. This recommendation follows ACR consensus guidelines: White Paper of the ACR Incidental Findings Committee II on Vascular Findings. J Am Coll Radiol 2013; 10:789-794. Aortic aneurysm NOS (ICD10-I71.9). Electronically Signed   By: Jeronimo Greaves M.D.   On: 12/10/2018 18:16   Dg Chest Port 1 View  Result Date: 12/10/2018 CLINICAL DATA:  Vomiting. Fall. Hypertension. Pacemaker. Ex-smoker. EXAM: PORTABLE CHEST 1 VIEW COMPARISON:  03/30/2018 report. 03/27/2018 images and report. FINDINGS: Dual lead pacer. Midline trachea. Moderate cardiomegaly. Atherosclerosis in the transverse aorta. Right costophrenic angle minimally excluded. No pleural effusion or pneumothorax. No congestive failure. Suggestion of mild hyperinflation with interstitial  thickening. No lobar consolidation. IMPRESSION: Cardiomegaly without congestive failure. Mild hyperinflation. Aortic Atherosclerosis (ICD10-I70.0).  In Electronically Signed   By: Jeronimo Greaves M.D.   On: 12/10/2018 16:59   US Abdomen Limited Ruq  Result Date: 12/10/2018 CLINICAL DATA:  85 y/o  M; elevated liver function test. EXAM: ULTRASOUND ABDOMEN LIMITED RIGHT UPPER QUADRANT COMPARISON:  None. FINDINGS: Gallbladder: Cholecystectomy. Common bile duct: Diameter: 11.7 mm Liver: Intrahepatic biliary ductal dilatation. No focal liver abnormality identified. Portal vein is patent on color Doppler imaging with normal direction of blood flow towards the liver. IMPRESSION: Intra and extrahepatic biliary ductal dilatation as seen on prior CT of abdomen and pelvis. The distal common bile duct is not visible due to bowel gas shadow. Electronically Signed   By: Mitzi Hansen M.D.   On: 12/10/2018 21:53        Scheduled Meds:  irbesartan  75 mg Oral Daily   polyethylene glycol  17 g Oral Daily   Continuous Infusions:  dextrose 5 % and 0.45% NaCl 50 mL/hr at 12/11/18 1300     LOS: 1 day    Time spent: 35 mins.More than 50% of that time was spent in counseling and/or coordination of care.      Burnadette Pop, MD [facility] Pager [phone]  If 7PM-7AM, please contact night-coverage [email or web address] Password [email or web address] 12/11/2018, 1:58 PM

## 2018-12-12 ENCOUNTER — Encounter (HOSPITAL_COMMUNITY)
Admission: EM | Disposition: A | Discharge status: Other Acute Inpatient Hospital | Payer: Self-pay | Source: Home / Self Care | Attending: Pulmonary Disease

## 2018-12-12 ENCOUNTER — Inpatient Hospital Stay (HOSPITAL_COMMUNITY): Payer: Medicare Other | Admitting: Certified Registered Nurse Anesthetist

## 2018-12-12 ENCOUNTER — Inpatient Hospital Stay (HOSPITAL_COMMUNITY): Payer: Medicare Other

## 2018-12-12 ENCOUNTER — Encounter (HOSPITAL_COMMUNITY): Payer: Self-pay | Admitting: *Deleted

## 2018-12-12 DIAGNOSIS — R7401 Elevation of levels of liver transaminase levels: Secondary | ICD-10-CM

## 2018-12-12 DIAGNOSIS — S065X9A Traumatic subdural hemorrhage with loss of consciousness of unspecified duration, initial encounter: Principal | ICD-10-CM

## 2018-12-12 DIAGNOSIS — R0689 Other abnormalities of breathing: Secondary | ICD-10-CM

## 2018-12-12 DIAGNOSIS — R740 Nonspecific elevation of levels of transaminase and lactic acid dehydrogenase [LDH]: Secondary | ICD-10-CM

## 2018-12-12 HISTORY — PX: CRANIOTOMY: SHX93

## 2018-12-12 LAB — COMPREHENSIVE METABOLIC PANEL
ALT: 103 U/L — ABNORMAL HIGH (ref 0–44)
AST: 51 U/L — ABNORMAL HIGH (ref 15–41)
Albumin: 3.2 g/dL — ABNORMAL LOW (ref 3.5–5.0)
Alkaline Phosphatase: 613 U/L — ABNORMAL HIGH (ref 38–126)
Anion gap: 15 (ref 5–15)
BUN: 17 mg/dL (ref 8–23)
CO2: 22 mmol/L (ref 22–32)
Calcium: 9.4 mg/dL (ref 8.9–10.3)
Chloride: 96 mmol/L — ABNORMAL LOW (ref 98–111)
Creatinine, Ser: 0.81 mg/dL (ref 0.61–1.24)
GFR calc Af Amer: >60 mL/min (ref >60)
GFR calc non Af Amer: >60 mL/min (ref >60)
Glucose, Bld: 130 mg/dL — ABNORMAL HIGH (ref 70–99)
Potassium: 4 mmol/L (ref 3.5–5.1)
Sodium: 133 mmol/L — ABNORMAL LOW (ref 135–145)
Total Bilirubin: 2.4 mg/dL — ABNORMAL HIGH (ref 0.3–1.2)
Total Protein: 6.4 g/dL — ABNORMAL LOW (ref 6.5–8.1)

## 2018-12-12 LAB — ABO/RH: ABO/RH(D): O NEG

## 2018-12-12 LAB — POCT I-STAT 7, (LYTES, BLD GAS, ICA,H+H)
Acid-base deficit: 3 mmol/L — ABNORMAL HIGH (ref 0.0–2.0)
Bicarbonate: 21.6 mmol/L (ref 20.0–28.0)
Calcium, Ion: 1.17 mmol/L (ref 1.15–1.40)
HCT: 30 % — ABNORMAL LOW (ref 39.0–52.0)
Hemoglobin: 10.2 g/dL — ABNORMAL LOW (ref 13.0–17.0)
O2 Saturation: 100 %
Patient temperature: 97.6
Potassium: 3.6 mmol/L (ref 3.5–5.1)
Sodium: 135 mmol/L (ref 135–145)
TCO2: 23 mmol/L (ref 22–32)
pCO2 arterial: 35 mmHg (ref 32.0–48.0)
pH, Arterial: 7.396 (ref 7.350–7.450)
pO2, Arterial: 224 mmHg — ABNORMAL HIGH (ref 83.0–108.0)

## 2018-12-12 LAB — TYPE AND SCREEN
ABO/RH(D): O NEG
Antibody Screen: NEGATIVE

## 2018-12-12 SURGERY — ENDOSCOPIC RETROGRADE CHOLANGIOPANCREATOGRAPHY (ERCP) WITH PROPOFOL
Anesthesia: General

## 2018-12-12 SURGERY — CRANIOTOMY HEMATOMA EVACUATION SUBDURAL
Anesthesia: General | Site: Head | Laterality: Right

## 2018-12-12 SURGERY — CANCELLED PROCEDURE

## 2018-12-12 MED ORDER — EPHEDRINE 5 MG/ML INJ
INTRAVENOUS | Status: AC
  Filled 2018-12-12: qty 10

## 2018-12-12 MED ORDER — LIDOCAINE 2% (20 MG/ML) 5 ML SYRINGE
INTRAMUSCULAR | Status: AC
  Filled 2018-12-12: qty 5

## 2018-12-12 MED ORDER — ROCURONIUM BROMIDE 10 MG/ML (PF) SYRINGE
PREFILLED_SYRINGE | INTRAVENOUS | Status: AC
  Filled 2018-12-12: qty 10

## 2018-12-12 MED ORDER — SODIUM CHLORIDE 0.9 % IV SOLN
INTRAVENOUS | Status: DC | PRN
  Administered 2018-12-12: 17:00:00 25 ug/min via INTRAVENOUS

## 2018-12-12 MED ORDER — BACITRACIN ZINC 500 UNIT/GM EX OINT
TOPICAL_OINTMENT | CUTANEOUS | Status: DC | PRN
  Administered 2018-12-12: 1 via TOPICAL

## 2018-12-12 MED ORDER — FENTANYL CITRATE (PF) 100 MCG/2ML IJ SOLN: 25.0000 ug | INTRAMUSCULAR | Status: DC | PRN

## 2018-12-12 MED ORDER — INDOMETHACIN 50 MG RE SUPP
RECTAL | Status: AC
  Filled 2018-12-12: qty 2

## 2018-12-12 MED ORDER — CLEVIDIPINE BUTYRATE 0.5 MG/ML IV EMUL
0.0000 mg/h | INTRAVENOUS | Status: DC
  Filled 2018-12-12: qty 50

## 2018-12-12 MED ORDER — HYDRALAZINE HCL 20 MG/ML IJ SOLN
10.0000 mg | INTRAMUSCULAR | Status: DC | PRN
  Administered 2018-12-12: 19:00:00 20 mg via INTRAVENOUS
  Administered 2018-12-13: 10 mg via INTRAVENOUS
  Administered 2018-12-13 (×2): 20 mg via INTRAVENOUS
  Administered 2018-12-14: 08:00:00 10 mg via INTRAVENOUS
  Filled 2018-12-12 (×4): qty 1

## 2018-12-12 MED ORDER — BACITRACIN ZINC 500 UNIT/GM EX OINT
TOPICAL_OINTMENT | CUTANEOUS | Status: AC
  Filled 2018-12-12: qty 28.35

## 2018-12-12 MED ORDER — THROMBIN 5000 UNITS EX SOLR
OROMUCOSAL | Status: DC | PRN
  Administered 2018-12-12: 5 mL

## 2018-12-12 MED ORDER — CEFAZOLIN SODIUM 1 G IJ SOLR
INTRAMUSCULAR | Status: AC
  Filled 2018-12-12: qty 20

## 2018-12-12 MED ORDER — CIPROFLOXACIN IN D5W 400 MG/200ML IV SOLN: 400.0000 mg | Freq: Once | INTRAVENOUS | Status: DC

## 2018-12-12 MED ORDER — LIDOCAINE-EPINEPHRINE 1 %-1:100000 IJ SOLN
INTRAMUSCULAR | Status: DC | PRN
  Administered 2018-12-12: 10 mL

## 2018-12-12 MED ORDER — ROCURONIUM BROMIDE 50 MG/5ML IV SOSY
PREFILLED_SYRINGE | INTRAVENOUS | Status: DC | PRN
  Administered 2018-12-12: 50 mg via INTRAVENOUS
  Administered 2018-12-12: 10 mg via INTRAVENOUS

## 2018-12-12 MED ORDER — DEXAMETHASONE SODIUM PHOSPHATE 10 MG/ML IJ SOLN
INTRAMUSCULAR | Status: AC
  Filled 2018-12-12: qty 1

## 2018-12-12 MED ORDER — LABETALOL HCL 5 MG/ML IV SOLN
10.0000 mg | INTRAVENOUS | Status: DC | PRN
  Administered 2018-12-12: 19:00:00 5 mg via INTRAVENOUS
  Administered 2018-12-12: 12:00:00 10 mg via INTRAVENOUS
  Administered 2018-12-12 (×2): 5 mg via INTRAVENOUS
  Administered 2018-12-12 – 2018-12-13 (×5): 10 mg via INTRAVENOUS
  Filled 2018-12-12 (×5): qty 4

## 2018-12-12 MED ORDER — PROPOFOL 1000 MG/100ML IV EMUL
INTRAVENOUS | Status: AC
  Filled 2018-12-12: qty 100

## 2018-12-12 MED ORDER — SODIUM CHLORIDE 0.9 % IV SOLN
INTRAVENOUS | Status: DC | PRN
  Administered 2018-12-12: 500 mL

## 2018-12-12 MED ORDER — FENTANYL BOLUS VIA INFUSION
25.0000 ug | INTRAVENOUS | Status: DC | PRN
  Administered 2018-12-12: 25 ug via INTRAVENOUS
  Filled 2018-12-12: qty 25

## 2018-12-12 MED ORDER — CEFAZOLIN SODIUM-DEXTROSE 2-3 GM-%(50ML) IV SOLR
INTRAVENOUS | Status: DC | PRN
  Administered 2018-12-12: 2 g via INTRAVENOUS

## 2018-12-12 MED ORDER — CHLORHEXIDINE GLUCONATE CLOTH 2 % EX PADS
6.0000 | MEDICATED_PAD | Freq: Every day | CUTANEOUS | Status: DC
  Administered 2018-12-13: 6 via TOPICAL

## 2018-12-12 MED ORDER — PROPOFOL 10 MG/ML IV BOLUS
INTRAVENOUS | Status: DC | PRN
  Administered 2018-12-12: 100 mg via INTRAVENOUS

## 2018-12-12 MED ORDER — FENTANYL CITRATE (PF) 250 MCG/5ML IJ SOLN
INTRAMUSCULAR | Status: AC
  Filled 2018-12-12: qty 5

## 2018-12-12 MED ORDER — ONDANSETRON HCL 4 MG/2ML IJ SOLN
INTRAMUSCULAR | Status: DC | PRN
  Administered 2018-12-12: 4 mg via INTRAVENOUS

## 2018-12-12 MED ORDER — SODIUM CHLORIDE 0.9 % IV BOLUS
500.0000 mL | Freq: Once | INTRAVENOUS | Status: AC
  Administered 2018-12-12: 500 mL via INTRAVENOUS

## 2018-12-12 MED ORDER — SODIUM CHLORIDE 0.9 % IV SOLN
Freq: Once | INTRAVENOUS | Status: AC
  Administered 2018-12-12: 16:00:00 via INTRAVENOUS

## 2018-12-12 MED ORDER — FENTANYL CITRATE (PF) 100 MCG/2ML IJ SOLN
INTRAMUSCULAR | Status: DC | PRN
  Administered 2018-12-12: 50 ug via INTRAVENOUS
  Administered 2018-12-12 (×2): 100 ug via INTRAVENOUS

## 2018-12-12 MED ORDER — LIDOCAINE 2% (20 MG/ML) 5 ML SYRINGE
INTRAMUSCULAR | Status: DC | PRN
  Administered 2018-12-12: 100 mg via INTRAVENOUS

## 2018-12-12 MED ORDER — LABETALOL HCL 5 MG/ML IV SOLN: 10.0000 mg | INTRAVENOUS | Status: DC | PRN

## 2018-12-12 MED ORDER — THROMBIN 20000 UNITS EX SOLR
CUTANEOUS | Status: AC
  Filled 2018-12-12: qty 20000

## 2018-12-12 MED ORDER — FENTANYL 2500MCG IN NS 250ML (10MCG/ML) PREMIX INFUSION: 0.0000 ug/h | INTRAVENOUS | Status: DC

## 2018-12-12 MED ORDER — PROPOFOL 1000 MG/100ML IV EMUL
5.0000 ug/kg/min | INTRAVENOUS | Status: DC
  Administered 2018-12-12: 19:00:00 20 ug/kg/min via INTRAVENOUS
  Administered 2018-12-12: 20:00:00 30 ug/kg/min via INTRAVENOUS

## 2018-12-12 MED ORDER — THROMBIN 5000 UNITS EX SOLR
CUTANEOUS | Status: AC
  Filled 2018-12-12: qty 5000

## 2018-12-12 MED ORDER — CIPROFLOXACIN IN D5W 400 MG/200ML IV SOLN
INTRAVENOUS | Status: AC
  Filled 2018-12-12: qty 200

## 2018-12-12 MED ORDER — DEXTROSE-NACL 5-0.45 % IV SOLN
INTRAVENOUS | Status: DC
  Administered 2018-12-12 – 2018-12-13 (×3): via INTRAVENOUS

## 2018-12-12 MED ORDER — FENTANYL 2500MCG IN NS 250ML (10MCG/ML) PREMIX INFUSION
25.0000 ug/h | INTRAVENOUS | Status: DC
  Administered 2018-12-12: 21:00:00 25 ug/h via INTRAVENOUS
  Filled 2018-12-12: qty 250

## 2018-12-12 MED ORDER — ONDANSETRON HCL 4 MG/2ML IJ SOLN
INTRAMUSCULAR | Status: AC
  Filled 2018-12-12: qty 2

## 2018-12-12 MED ORDER — PHENYLEPHRINE 40 MCG/ML (10ML) SYRINGE FOR IV PUSH (FOR BLOOD PRESSURE SUPPORT)
PREFILLED_SYRINGE | INTRAVENOUS | Status: DC | PRN
  Administered 2018-12-12 (×6): 80 ug via INTRAVENOUS

## 2018-12-12 MED ORDER — IRBESARTAN 150 MG PO TABS: 150.0000 mg | ORAL_TABLET | Freq: Every day | ORAL | Status: DC

## 2018-12-12 MED ORDER — LIDOCAINE-EPINEPHRINE 1 %-1:100000 IJ SOLN
INTRAMUSCULAR | Status: AC
  Filled 2018-12-12: qty 1

## 2018-12-12 MED ORDER — PROPOFOL 10 MG/ML IV BOLUS
INTRAVENOUS | Status: AC
  Filled 2018-12-12: qty 20

## 2018-12-12 MED ORDER — INDOMETHACIN 50 MG RE SUPP: 100.0000 mg | Freq: Once | RECTAL | Status: DC

## 2018-12-12 MED ORDER — ORAL CARE MOUTH RINSE
15.0000 mL | OROMUCOSAL | Status: DC
  Administered 2018-12-12 – 2018-12-13 (×10): 15 mL via OROMUCOSAL

## 2018-12-12 MED ORDER — EPHEDRINE SULFATE-NACL 50-0.9 MG/10ML-% IV SOSY
PREFILLED_SYRINGE | INTRAVENOUS | Status: DC | PRN
  Administered 2018-12-12: 5 mg via INTRAVENOUS
  Administered 2018-12-12 (×4): 10 mg via INTRAVENOUS

## 2018-12-12 MED ORDER — THROMBIN 20000 UNITS EX SOLR
CUTANEOUS | Status: DC | PRN
  Administered 2018-12-12: 20 mL via TOPICAL

## 2018-12-12 MED ORDER — LACTATED RINGERS IV SOLN
INTRAVENOUS | Status: AC | PRN
  Administered 2018-12-12: 1000 mL via INTRAVENOUS

## 2018-12-12 MED ORDER — 0.9 % SODIUM CHLORIDE (POUR BTL) OPTIME
TOPICAL | Status: DC | PRN
  Administered 2018-12-12: 2000 mL
  Administered 2018-12-12: 1000 mL

## 2018-12-12 MED ORDER — GLUCAGON HCL RDNA (DIAGNOSTIC) 1 MG IJ SOLR
INTRAMUSCULAR | Status: AC
  Filled 2018-12-12: qty 1

## 2018-12-12 MED ORDER — SUCCINYLCHOLINE CHLORIDE 200 MG/10ML IV SOSY
PREFILLED_SYRINGE | INTRAVENOUS | Status: AC
  Filled 2018-12-12: qty 10

## 2018-12-12 MED ORDER — PHENYLEPHRINE 40 MCG/ML (10ML) SYRINGE FOR IV PUSH (FOR BLOOD PRESSURE SUPPORT)
PREFILLED_SYRINGE | INTRAVENOUS | Status: AC
  Filled 2018-12-12: qty 10

## 2018-12-12 MED ORDER — DEXAMETHASONE SODIUM PHOSPHATE 10 MG/ML IJ SOLN
INTRAMUSCULAR | Status: DC | PRN
  Administered 2018-12-12: 10 mg via INTRAVENOUS

## 2018-12-12 MED ORDER — FENTANYL CITRATE (PF) 100 MCG/2ML IJ SOLN
25.0000 ug | Freq: Once | INTRAMUSCULAR | Status: AC
  Administered 2018-12-12: 25 ug via INTRAVENOUS
  Filled 2018-12-12: qty 2

## 2018-12-12 MED ORDER — SODIUM CHLORIDE 0.9 % IV SOLN
INTRAVENOUS | Status: DC | PRN
  Administered 2018-12-12: 16:00:00 via INTRAVENOUS

## 2018-12-12 MED ORDER — SUCCINYLCHOLINE CHLORIDE 200 MG/10ML IV SOSY
PREFILLED_SYRINGE | INTRAVENOUS | Status: DC | PRN
  Administered 2018-12-12: 100 mg via INTRAVENOUS

## 2018-12-12 MED ORDER — CHLORHEXIDINE GLUCONATE 0.12% ORAL RINSE (MEDLINE KIT)
15.0000 mL | Freq: Two times a day (BID) | OROMUCOSAL | Status: DC
  Administered 2018-12-12 – 2018-12-13 (×2): 15 mL via OROMUCOSAL

## 2018-12-12 SURGICAL SUPPLY — 54 items
BLADE CLIPPER SURG (BLADE) ×2 IMPLANT
BUR ACORN 9.0 PRECISION (BURR) ×2 IMPLANT
BUR SPIRAL ROUTER 2.3 (BUR) ×2 IMPLANT
CANISTER SUCT 3000ML PPV (MISCELLANEOUS) ×3 IMPLANT
DRAIN SNY WOU 7FLT (WOUND CARE) ×1 IMPLANT
DRAPE NEUROLOGICAL W/INCISE (DRAPES) ×2 IMPLANT
DRAPE SHEET LG 3/4 BI-LAMINATE (DRAPES) ×2 IMPLANT
DRAPE WARM FLUID 44X44 (DRAPES) ×2 IMPLANT
DURAPREP 6ML APPLICATOR 50/CS (WOUND CARE) ×2 IMPLANT
ELECT REM PT RETURN 9FT ADLT (ELECTROSURGICAL) ×2
ELECTRODE REM PT RTRN 9FT ADLT (ELECTROSURGICAL) ×1 IMPLANT
EVACUATOR SILICONE 100CC (DRAIN) ×1 IMPLANT
GAUZE SPONGE 4X4 12PLY STRL (GAUZE/BANDAGES/DRESSINGS) ×2 IMPLANT
GLOVE BIO SURGEON STRL SZ7.5 (GLOVE) ×4 IMPLANT
GLOVE BIOGEL PI IND STRL 7.0 (GLOVE) IMPLANT
GLOVE BIOGEL PI IND STRL 7.5 (GLOVE) ×2 IMPLANT
GLOVE BIOGEL PI INDICATOR 7.0 (GLOVE) ×2
GLOVE BIOGEL PI INDICATOR 7.5 (GLOVE) ×4
GOWN STRL REUS W/ TWL LRG LVL3 (GOWN DISPOSABLE) ×2 IMPLANT
GOWN STRL REUS W/ TWL XL LVL3 (GOWN DISPOSABLE) IMPLANT
GOWN STRL REUS W/TWL 2XL LVL3 (GOWN DISPOSABLE) IMPLANT
GOWN STRL REUS W/TWL LRG LVL3 (GOWN DISPOSABLE) ×3
GOWN STRL REUS W/TWL XL LVL3 (GOWN DISPOSABLE)
HEMOSTAT POWDER KIT SURGIFOAM (HEMOSTASIS) ×2 IMPLANT
KIT BASIN OR (CUSTOM PROCEDURE TRAY) ×2 IMPLANT
KIT TURNOVER KIT B (KITS) ×2 IMPLANT
NEEDLE HYPO 22GX1.5 SAFETY (NEEDLE) ×2 IMPLANT
NS IRRIG 1000ML POUR BTL (IV SOLUTION) ×2 IMPLANT
PACK CRANIOTOMY CUSTOM (CUSTOM PROCEDURE TRAY) ×2 IMPLANT
PATTIES SURGICAL .5 X.5 (GAUZE/BANDAGES/DRESSINGS) IMPLANT
PATTIES SURGICAL .5 X3 (DISPOSABLE) IMPLANT
PATTIES SURGICAL 1X1 (DISPOSABLE) IMPLANT
PLATE 1.5 2H 17 DOU T (Plate) ×1 IMPLANT
PLATE 1.5/0.5 18.5MM BURR HOLE (Plate) ×2 IMPLANT
SCREW SELF DRILL HT 1.5/4MM (Screw) ×13 IMPLANT
SPONGE NEURO XRAY DETECT 1X3 (DISPOSABLE) IMPLANT
SPONGE SURGIFOAM ABS GEL 100 (HEMOSTASIS) ×2 IMPLANT
STAPLER VISISTAT 35W (STAPLE) ×2 IMPLANT
STOCKINETTE 6  STRL (DRAPES) ×1
STOCKINETTE 6 STRL (DRAPES) ×1 IMPLANT
SUT ETHILON 3 0 FSL (SUTURE) IMPLANT
SUT ETHILON 3 0 PS 1 (SUTURE) IMPLANT
SUT NURALON 4 0 TR CR/8 (SUTURE) ×5 IMPLANT
SUT STEEL 0 (SUTURE)
SUT STEEL 0 18XMFL TIE 17 (SUTURE) IMPLANT
SUT VIC AB 0 CT1 18XCR BRD8 (SUTURE) ×2 IMPLANT
SUT VIC AB 0 CT1 8-18 (SUTURE) ×1
SUT VIC AB 2-0 CP2 18 (SUTURE) ×3 IMPLANT
SUT VIC AB 3-0 SH 8-18 (SUTURE) ×3 IMPLANT
TOWEL GREEN STERILE (TOWEL DISPOSABLE) ×2 IMPLANT
TOWEL GREEN STERILE FF (TOWEL DISPOSABLE) ×2 IMPLANT
TRAY FOLEY MTR SLVR 16FR STAT (SET/KITS/TRAYS/PACK) ×2 IMPLANT
TUBE CONNECTING 12X1/4 (SUCTIONS) ×2 IMPLANT
WATER STERILE IRR 1000ML POUR (IV SOLUTION) ×2 IMPLANT

## 2018-12-12 NOTE — Interval H&P Note (Signed)
History and Physical Interval Note:  12/12/2018 7:06 AM  Andrew Soto.  has presented today for surgery, with the diagnosis of elevated lfts,  dilated bile duct, previous stent to CBD vs pancreatic duct..  The various methods of treatment have been discussed with the patient and family. After consideration of risks, benefits and other options for treatment, the patient has consented to  Procedure(s): ENDOSCOPIC RETROGRADE CHOLANGIOPANCREATOGRAPHY (ERCP) WITH PROPOFOL (N/A) as a surgical intervention.  The patient's history has been reviewed, patient examined, no change in status, stable for surgery.  I have reviewed the patient's chart and labs.  Questions were answered to the patient's satisfaction.     Milus Banister

## 2018-12-12 NOTE — Progress Notes (Signed)
RT NOTE:  ETT pulled back to 23cm and secured with holder. Verbal request from radiology.

## 2018-12-12 NOTE — Progress Notes (Signed)
In pre-procedure for ERCP, pt lethargic, oriented to self only.  Order previously placed in chart for stat head CT.  Pt cancelled for today's ERCP and transported back to unit.  Vista Lawman, RN

## 2018-12-12 NOTE — Progress Notes (Signed)
No notes in Epic however apparently a stat head CT was ordered overnight for MS changes I believe.  He was sleepy but arousable in endo admitting.  Cancelling ERCP for now.

## 2018-12-12 NOTE — Progress Notes (Signed)
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  He has significant concern about the SDHs and his altered mental status.  The ERCP is not an emergency and so we'll readdress this issue over the next few days as his neurologic issues are addressed.

## 2018-12-12 NOTE — Transfer of Care (Signed)
Immediate Anesthesia Transfer of Care Note  Patient: Andrew Soto.  Procedure(s) Performed: CRANIOTOMY FOR EVACUATION OF SUBDURAL HEMATOMA (Right Head)  Patient Location: ICU  Anesthesia Type:General  Level of Consciousness: Patient remains intubated per anesthesia plan  Airway & Oxygen Therapy: Patient remains intubated per anesthesia plan and Patient placed on Ventilator (see vital sign flow sheet for setting)  Post-op Assessment: Report given to RN and Post -op Vital signs reviewed and unstable, Anesthesiologist notified  Post vital signs: Reviewed and stable  Last Vitals:  Vitals Value Taken Time  BP 192/73 12/12/18 1845  Temp    Pulse 61 12/12/18 1857  Resp 18 12/12/18 1857  SpO2 100 % 12/12/18 1857  Vitals shown include unvalidated device data.  Last Pain:  Vitals:   12/12/18 0752  TempSrc:   PainSc: 0-No pain         Complications: No apparent anesthesia complications

## 2018-12-12 NOTE — Progress Notes (Signed)
PROGRESS NOTE    Andrew Ill.  UJW:[id number] DOB: Jan 30, 1932 DOA: 12/10/2018 PCP: Carlis Stable, PA-C   Brief Narrative:  Patient is 85 year old male with history of CVA, atrial fibrillation, aortic stenosis, hyperlipidemia, colon cancer, hypertension, recurrent gallstone pancreatitis, recurrent UTI who presented to the emergency department after a fall.  CT head showedLarge subdural hematoma overlying right cerebral cortex and right tentorial region, resulting in 5 mm of right to left midline shift. CT of abdomen and pelvis showed stent within the distal common duct, terminating at the inferior genu of the duodenum. Intra and extrahepatic biliary duct dilatation above the stent.  He was found to have elevated ALP, ALT and AST.  Complaining of some abdominal pain on presentation.  Neurosurgery, GI consulted.  There was no plan for intervention for subdural hematoma.  12/12/2018: Patient found to be more confused this morning.  CT head showed increased size of right larger than left subdural Hematomas and mildly increased small volume subarachnoid hemorrhage.Neurosurgery aware.  Neurosurgery discussed with family about the need of craniotomy but given his multiple comorbidities and age, family will discuss on the family  before reaching to a decision for the surgery as the surgery has high risks on itself.  Assessment & Plan:   Principal Problem:   SDH (subdural hematoma) (HCC) Active Problems:   Pancreatitis, recurrent   History of CVA (cerebrovascular accident)   Hypertension goal BP (blood pressure) < 140/90   Atrial fibrillation (HCC)   Depression   Multiple falls   Gallstone of bile duct with obstruction  Intracranial hemorrhage: CT finding as above.  Neurosurgery aware about the new changes.  Continue to monitor mental status, frequent neurological checks.  Monitor blood pressure.   Patient is very confused, barely responsive this morning.  He has developed left  facial droop.  Elevated liver enzymes/dilated biliary ducts: CT finding as above.  He had biliary stent placed in the past but patient does not remember the exact details. GI was following.  He was complaining of abdomen pain on presentation.  GI was planning for ERCP but the procedure  had to be held due to his changed mental status.  Hypertensive urgency: Continue labetalol for now.  History of CVAs: On aspirin at home which has been on hold.  History of recurrent pancreatitis: Suspected to be from statin.  Imaging does not show any pancreatic inflammation.  Abdominal pain has resolved.  History of paroxysmal A. fib: Currently in normal sinus rhythm.  Not on anticoagulation.  On amiodarone at home.  Hyperlipidemia: On Zetia  History of depression/anxiety: Home medicines on hold.  Frequent falls: Physical therapy consulted.  Elevated BNP/troponin: Very nonspecific.  No sign of heart failure.  Patient had denied any chest pain.  No indication for further intervention  Poor prognosis/multiple comorbidities/advanced age: I have requested palliative care evaluation         DVT prophylaxis: SCD Code Status: Full Family Communication: Called son.Phone not received. Voice mail left Disposition Plan: Undetermined.  Family making decision for craniotomy.   Consultants: Neurosurgery, GI  Procedures: None  Antimicrobials:  Anti-infectives (From admission, onward)   Start     Dose/Rate Route Frequency Ordered Stop   12/12/18 0715  ciprofloxacin (CIPRO) IVPB 400 mg  Status:  Discontinued     400 mg 200 mL/hr over 60 Minutes Intravenous  Once 12/12/18 0707 12/12/18 0743      Subjective:  Patient seen and examined the bedside this morning.  Looks comfortable.  Alert and  oriented.  Hemodynamically stable.  Denies any headache, nausea or vomiting.  Abdominal pain has improved.  Objective: Vitals:   12/12/18 1007 12/12/18 1200 12/12/18 1208 12/12/18 1213  BP: (!) 167/71 (!) 180/62  (!) 166/56 (!) 152/49  Pulse: 82  79 78  Resp: (!) 23 (!) 27 (!) 22 (!) 25  Temp:  98.3 F (36.8 C)    TempSrc:      SpO2: 98% 98% 96% 95%  Weight:      Height:        Intake/Output Summary (Last 24 hours) at 12/12/2018 1402 Last data filed at 12/12/2018 1100 Gross per 24 hour  Intake 120 ml  Output 150 ml  Net -30 ml   Filed Weights   12/10/18 1325 12/10/18 2357 12/12/18 0704  Weight: 72.6 kg 63.9 kg 63.9 kg    Examination:  General exam:  debilitated elderly gentleman,obtunded HEENT:Oral mucosa moist, Ear/Nose normal on gross exam Respiratory system: Bilateral equal air entry, normal vesicular breath sounds, no wheezes or crackles  Cardiovascular system: S1 & S2 heard, RRR. No JVD, murmurs, rubs, gallops or clicks. No pedal edema. Gastrointestinal system: Abdomen is nondistended, soft . No organomegaly or masses felt. Normal bowel sounds heard.  Mild generalized tenderness Central nervous system: Not Alert and oriented. Left facial droop drop.neurologic examination could not be conducted due to patient's mental status  extremities: No edema, no clubbing ,no cyanosis, distal peripheral pulses palpable. Skin: No rashes, lesions or ulcers,no icterus ,no pallor    Data Reviewed: I have personally reviewed following labs and imaging studies  CBC: Recent Labs  Lab 12/10/18 1425 12/11/18 0239  WBC 9.8 12.3*  NEUTROABS 8.6*  --   HGB 13.7 13.0  HCT 41.6 39.1  MCV 101.2* 99.2  PLT 232 235   Basic Metabolic Panel: Recent Labs  Lab 12/10/18 1425 12/11/18 0239 12/12/18 0455  NA 132* 133* 133*  K 4.2 4.1 4.0  CL 100 97* 96*  CO2 23 24 22   GLUCOSE 143* 160* 130*  BUN 14 13 17   CREATININE 0.70 0.78 0.81  CALCIUM 9.0 9.0 9.4   GFR: Estimated Creatinine Clearance: 60.2 mL/min (by C-G formula based on SCr of 0.81 mg/dL). Liver Function Tests: Recent Labs  Lab 12/10/18 1425 12/11/18 0239 12/12/18 0455  AST 98* 66* 51*  ALT 154* 129* 103*  ALKPHOS 701* 660* 613*    BILITOT 2.4* 1.7* 2.4*  PROT 5.8* 5.9* 6.4*  ALBUMIN 3.1* 3.0* 3.2*   No results for input(s): LIPASE, AMYLASE in the last 168 hours. No results for input(s): AMMONIA in the last 168 hours. Coagulation Profile: No results for input(s): INR, PROTIME in the last 168 hours. Cardiac Enzymes: No results for input(s): CKTOTAL, CKMB, CKMBINDEX, TROPONINI in the last 168 hours. BNP (last 3 results) No results for input(s): PROBNP in the last 8760 hours. HbA1C: No results for input(s): HGBA1C in the last 72 hours. CBG: Recent Labs  Lab 12/10/18 1322  GLUCAP 136*   Lipid Profile: No results for input(s): CHOL, HDL, LDLCALC, TRIG, CHOLHDL, LDLDIRECT in the last 72 hours. Thyroid Function Tests: No results for input(s): TSH, T4TOTAL, FREET4, T3FREE, THYROIDAB in the last 72 hours. Anemia Panel: No results for input(s): VITAMINB12, FOLATE, FERRITIN, TIBC, IRON, RETICCTPCT in the last 72 hours. Sepsis Labs: No results for input(s): PROCALCITON, LATICACIDVEN in the last 168 hours.  Recent Results (from the past 240 hour(s))  SARS Coronavirus 2 (CEPHEID - Performed in [facility] hospital lab), [facility] Aphosp Order  Status: None   Collection Time: 12/10/18  3:51 PM   Specimen: Nasopharyngeal Swab  Result Value Ref Range Status   SARS Coronavirus 2 NEGATIVE NEGATIVE Final    Comment: (NOTE) If result is NEGATIVE SARS-CoV-2 target nucleic acids are NOT DETECTED. The SARS-CoV-2 RNA is generally detectable in upper and lower  respiratory specimens during the acute phase of infection. The lowest  concentration of SARS-CoV-2 viral copies this assay can detect is 250  copies / mL. A negative result does not preclude SARS-CoV-2 infection  and should not be used as the sole basis for treatment or other  patient management decisions.  A negative result may occur with  improper specimen collection / handling, submission of specimen other  than nasopharyngeal swab, presence of viral mutation(s) within  the  areas targeted by this assay, and inadequate number of viral copies  (<250 copies / mL). A negative result must be combined with clinical  observations, patient history, and epidemiological information. If result is POSITIVE SARS-CoV-2 target nucleic acids are DETECTED. The SARS-CoV-2 RNA is generally detectable in upper and lower  respiratory specimens dur ing the acute phase of infection.  Positive  results are indicative of active infection with SARS-CoV-2.  Clinical  correlation with patient history and other diagnostic information is  necessary to determine patient infection status.  Positive results do  not rule out bacterial infection or co-infection with other viruses. If result is PRESUMPTIVE POSTIVE SARS-CoV-2 nucleic acids MAY BE PRESENT.   A presumptive positive result was obtained on the submitted specimen  and confirmed on repeat testing.  While 2019 novel coronavirus  (SARS-CoV-2) nucleic acids may be present in the submitted sample  additional confirmatory testing may be necessary for epidemiological  and / or clinical management purposes  to differentiate between  SARS-CoV-2 and other Sarbecovirus currently known to infect humans.  If clinically indicated additional testing with an alternate test  methodology [phone]) is advised. The SARS-CoV-2 RNA is generally  detectable in upper and lower respiratory sp ecimens during the acute  phase of infection. The expected result is Negative. Fact Sheet for Patients:  BoilerBrush.com.cy Fact Sheet for Healthcare Providers: https://pope.com/ This test is not yet approved or cleared by the [place] FDA and has been authorized for detection and/or diagnosis of SARS-CoV-2 by FDA under an Emergency Use Authorization (EUA).  This EUA will remain in effect (meaning this test can be used) for the duration of the COVID-19 declaration under Section 564(b)(1) of the Act, 21  U.S.C. section 360bbb-3(b)(1), unless the authorization is terminated or revoked sooner. Performed at [facility], [address] [place], [place] [address]   Urine culture     Status: None   Collection Time: 12/10/18  6:04 PM   Specimen: Urine, Clean Catch  Result Value Ref Range Status   Specimen Description URINE, CLEAN CATCH  Final   Special Requests NONE  Final   Culture   Final    NO GROWTH Performed at [facility], [address] [place], [place] [address]    Report Status 12/11/2018 FINAL  Final  MRSA PCR Screening     Status: None   Collection Time: 12/11/18  2:45 AM   Specimen: Nasal Mucosa; Nasopharyngeal  Result Value Ref Range Status   MRSA by PCR NEGATIVE NEGATIVE Final    Comment:        The GeneXpert MRSA Assay (FDA approved for NASAL specimens only), is one component of a comprehensive MRSA colonization  surveillance program. It is not intended to diagnose MRSA infection nor to guide or monitor treatment for MRSA infections. Performed at [facility], [address] [place], [place] [id number]          Radiology Studies: Ct Head Wo Contrast  Result Date: 12/12/2018 CLINICAL DATA:  Altered level of consciousness. Follow-up subdural hematoma. EXAM: CT HEAD WITHOUT CONTRAST TECHNIQUE: Contiguous axial images were obtained from the base of the skull through the vertex without intravenous contrast. COMPARISON:  12/10/2018 FINDINGS: There is intermittent moderate motion artifact despite repeat imaging. Brain: Subdural hematoma over the right cerebral convexity has mildly enlarged, now measuring up to 18 mm in thickness. Subdural blood is again noted to extend along the falx and tentorium. A smaller mixed density subdural hematoma over the left cerebral convexity has also mildly enlarged and now measures up to 5 mm in thickness. There is scattered small volume subarachnoid hemorrhage bilaterally which has increased, and there is a small amount  of blood in the occipital horn of the left lateral ventricle which has mildly increased. Right cerebral hemispheric mass effect has slightly increased, however leftward midline shift has not significantly changed and remains 5 mm. No acute large territory infarct is identified. There is a chronic left parietal infarct. Mild cerebral atrophy is noted. Periventricular white matter hypodensities are similar to the prior study and nonspecific but compatible with mild chronic small vessel ischemic disease. Vascular: Calcified atherosclerosis at the skull base. Skull: No fracture or focal osseous lesion. Sinuses/Orbits: Visualized paranasal sinuses and mastoid air cells are clear. Bilateral cataract extraction is noted. Other: None. IMPRESSION: 1. Mildly increased size of right larger than left subdural hematomas. 2. Mildly increased small volume subarachnoid hemorrhage. 3. Unchanged 5 mm leftward midline shift. Electronically Signed   By: Sebastian AcheAllen  Grady M.D.   On: 12/12/2018 08:53   Ct Head Wo Contrast  Result Date: 12/10/2018 CLINICAL DATA:  Posttraumatic headache after fall. EXAM: CT HEAD WITHOUT CONTRAST CT CERVICAL SPINE WITHOUT CONTRAST TECHNIQUE: Multidetector CT imaging of the head and cervical spine was performed following the standard protocol without intravenous contrast. Multiplanar CT image reconstructions of the cervical spine were also generated. COMPARISON:  CT scan of March 27, 2018. FINDINGS: CT HEAD FINDINGS Brain: Ventricular size is within normal limits. Mild chronic ischemic white matter disease is noted. Mild diffuse cortical atrophy is noted. Large subdural hematoma is seen overlying the right cerebral cortex and right tentorial region. Subdural hemorrhage is also noted in the right parafalcine region. This results in 5 mm of right to left midline shift. Maximum thickness of 15 mm is noted. Focus of subarachnoid hemorrhage is noted in right parietal cortex. Small left subdural hematoma is seen  overlying left parietal cortex. Vascular: No hyperdense vessel or unexpected calcification. Skull: Normal. Negative for fracture or focal lesion. Sinuses/Orbits: No acute finding. Other: None. CT CERVICAL SPINE FINDINGS Alignment: Grade 1 retrolisthesis of C5-6 is noted secondary to severe degenerative disc disease at this level. Grade 1 anterolisthesis of C7-T1 is noted secondary to posterior facet joint hypertrophy. Skull base and vertebrae: No acute fracture. No primary bone lesion or focal pathologic process. Soft tissues and spinal canal: No prevertebral fluid or swelling. No visible canal hematoma. Disc levels: Severe degenerative disc disease is noted at C3-4, C4-5, C5-6, C6-7 and C7-T1. Upper chest: Negative. Other: Degenerative changes are seen involving posterior facet joints bilaterally. IMPRESSION: Large subdural hematoma is seen overlying right cerebral cortex and right tentorial region, resulting in 5 mm of  right to left midline shift. Demonstrates a maximum thickness of 15 mm. Focus of subarachnoid hemorrhage is noted in the right parietal cortex. Small left subdural hematoma is seen overlying left parietal cortex. Critical Value/emergent results were called by telephone at the time of interpretation on 12/10/2018 at 3:45 pm to Dr. Tilden Fossa , who verbally acknowledged these results. Severe multilevel degenerative disc disease is noted in the cervical spine. No fracture or acute abnormality is noted. Electronically Signed   By: Lupita Raider M.D.   On: 12/10/2018 15:46   Ct Chest W Contrast  Result Date: 12/10/2018 CLINICAL DATA:  Abdominal trauma.  Vomiting.  Head bleed. EXAM: CT CHEST, ABDOMEN, AND PELVIS WITH CONTRAST TECHNIQUE: Multidetector CT imaging of the chest, abdomen and pelvis was performed following the standard protocol during bolus administration of intravenous contrast. CONTRAST:  OMNIPAQUE IOHEXOL 300 MG/ML  SOLN COMPARISON:  Chest radiograph of earlier in the day.  No  prior CTs. FINDINGS: CT CHEST FINDINGS Cardiovascular: Pacer. Aortic valve repair. Aortic and branch vessel atherosclerosis. Mild cardiomegaly, without pericardial effusion. Multivessel coronary artery atherosclerosis. No aortic laceration or mediastinal hematoma. Mediastinum/Nodes: No mediastinal or hilar adenopathy. Lungs/Pleura: No pleural fluid. Minimal left pleural thickening. No pneumothorax. No pulmonary contusion or consolidation. Subpleural interstitial prominence is suspicious for mild interstitial lung disease. Musculoskeletal: Degenerate changes of both glenohumeral joints, worse on the right. CT ABDOMEN PELVIS FINDINGS Hepatobiliary: No focal liver lesion. Cholecystectomy. Mild intrahepatic biliary duct dilatation. The common duct measures 1.4 cm in the porta hepatis on coronal image 54. A distal common duct stent originates at the level of the pancreatic head and terminates at the inferior genu of the duodenum. Pancreas: Normal pancreas, without duct dilatation or acute inflammation. Spleen: Normal in size, without focal abnormality. Adrenals/Urinary Tract: Normal right adrenal gland. Mild left adrenal thickening. Bilateral too small to characterize renal lesions. Left renal upper pole cysts. Mild bladder distension. Stomach/Bowel: Normal stomach, without wall thickening. Surgical sutures within the rectum. Normal terminal ileum. Normal small bowel. Vascular/Lymphatic: Advanced aortic and branch vessel atherosclerosis. Infrarenal abdominal aortic ectasia at 3.1 cm. Distal aortic/left common iliac artery stent graft. No abdominopelvic adenopathy. Reproductive: Mild prostatomegaly. Other: No significant free fluid. Musculoskeletal: Mild osteopenia. Presumed bone island in the right iliac. Lumbosacral spondylosis. IMPRESSION: 1. No acute or posttraumatic deformity identified. 2. Stent within the distal common duct, terminating at the inferior genu of the duodenum. Intra and extrahepatic biliary duct  dilatation above the stent. Correlate with bilirubin levels. The stent may need to be repositioned or replaced to traverse the presumed area of stricture. 3. Coronary artery atherosclerosis. Aortic Atherosclerosis (ICD10-I70.0). 4. Infrarenal abdominal aortic ectasia at 3.1 cm. Recommend followup by ultrasound in 3 years. This recommendation follows ACR consensus guidelines: White Paper of the ACR Incidental Findings Committee II on Vascular Findings. J Am Coll Radiol 2013; 10:789-794. Aortic aneurysm NOS (ICD10-I71.9). Electronically Signed   By: Jeronimo Greaves M.D.   On: 12/10/2018 18:16   Ct Cervical Spine Wo Contrast  Result Date: 12/10/2018 CLINICAL DATA:  Posttraumatic headache after fall. EXAM: CT HEAD WITHOUT CONTRAST CT CERVICAL SPINE WITHOUT CONTRAST TECHNIQUE: Multidetector CT imaging of the head and cervical spine was performed following the standard protocol without intravenous contrast. Multiplanar CT image reconstructions of the cervical spine were also generated. COMPARISON:  CT scan of March 27, 2018. FINDINGS: CT HEAD FINDINGS Brain: Ventricular size is within normal limits. Mild chronic ischemic white matter disease is noted. Mild diffuse cortical atrophy is noted. Large subdural  hematoma is seen overlying the right cerebral cortex and right tentorial region. Subdural hemorrhage is also noted in the right parafalcine region. This results in 5 mm of right to left midline shift. Maximum thickness of 15 mm is noted. Focus of subarachnoid hemorrhage is noted in right parietal cortex. Small left subdural hematoma is seen overlying left parietal cortex. Vascular: No hyperdense vessel or unexpected calcification. Skull: Normal. Negative for fracture or focal lesion. Sinuses/Orbits: No acute finding. Other: None. CT CERVICAL SPINE FINDINGS Alignment: Grade 1 retrolisthesis of C5-6 is noted secondary to severe degenerative disc disease at this level. Grade 1 anterolisthesis of C7-T1 is noted secondary  to posterior facet joint hypertrophy. Skull base and vertebrae: No acute fracture. No primary bone lesion or focal pathologic process. Soft tissues and spinal canal: No prevertebral fluid or swelling. No visible canal hematoma. Disc levels: Severe degenerative disc disease is noted at C3-4, C4-5, C5-6, C6-7 and C7-T1. Upper chest: Negative. Other: Degenerative changes are seen involving posterior facet joints bilaterally. IMPRESSION: Large subdural hematoma is seen overlying right cerebral cortex and right tentorial region, resulting in 5 mm of right to left midline shift. Demonstrates a maximum thickness of 15 mm. Focus of subarachnoid hemorrhage is noted in the right parietal cortex. Small left subdural hematoma is seen overlying left parietal cortex. Critical Value/emergent results were called by telephone at the time of interpretation on 12/10/2018 at 3:45 pm to Dr. Quintella Reichert , who verbally acknowledged these results. Severe multilevel degenerative disc disease is noted in the cervical spine. No fracture or acute abnormality is noted. Electronically Signed   By: Marijo Conception M.D.   On: 12/10/2018 15:46   Ct Abdomen Pelvis W Contrast  Result Date: 12/10/2018 CLINICAL DATA:  Abdominal trauma.  Vomiting.  Head bleed. EXAM: CT CHEST, ABDOMEN, AND PELVIS WITH CONTRAST TECHNIQUE: Multidetector CT imaging of the chest, abdomen and pelvis was performed following the standard protocol during bolus administration of intravenous contrast. CONTRAST:  125mL OMNIPAQUE IOHEXOL 300 MG/ML  SOLN COMPARISON:  Chest radiograph of earlier in the day.  No prior CTs. FINDINGS: CT CHEST FINDINGS Cardiovascular: Pacer. Aortic valve repair. Aortic and branch vessel atherosclerosis. Mild cardiomegaly, without pericardial effusion. Multivessel coronary artery atherosclerosis. No aortic laceration or mediastinal hematoma. Mediastinum/Nodes: No mediastinal or hilar adenopathy. Lungs/Pleura: No pleural fluid. Minimal left pleural  thickening. No pneumothorax. No pulmonary contusion or consolidation. Subpleural interstitial prominence is suspicious for mild interstitial lung disease. Musculoskeletal: Degenerate changes of both glenohumeral joints, worse on the right. CT ABDOMEN PELVIS FINDINGS Hepatobiliary: No focal liver lesion. Cholecystectomy. Mild intrahepatic biliary duct dilatation. The common duct measures 1.4 cm in the porta hepatis on coronal image 54. A distal common duct stent originates at the level of the pancreatic head and terminates at the inferior genu of the duodenum. Pancreas: Normal pancreas, without duct dilatation or acute inflammation. Spleen: Normal in size, without focal abnormality. Adrenals/Urinary Tract: Normal right adrenal gland. Mild left adrenal thickening. Bilateral too small to characterize renal lesions. Left renal upper pole cysts. Mild bladder distension. Stomach/Bowel: Normal stomach, without wall thickening. Surgical sutures within the rectum. Normal terminal ileum. Normal small bowel. Vascular/Lymphatic: Advanced aortic and branch vessel atherosclerosis. Infrarenal abdominal aortic ectasia at 3.1 cm. Distal aortic/left common iliac artery stent graft. No abdominopelvic adenopathy. Reproductive: Mild prostatomegaly. Other: No significant free fluid. Musculoskeletal: Mild osteopenia. Presumed bone island in the right iliac. Lumbosacral spondylosis. IMPRESSION: 1. No acute or posttraumatic deformity identified. 2. Stent within the distal common duct, terminating at  the inferior genu of the duodenum. Intra and extrahepatic biliary duct dilatation above the stent. Correlate with bilirubin levels. The stent may need to be repositioned or replaced to traverse the presumed area of stricture. 3. Coronary artery atherosclerosis. Aortic Atherosclerosis (ICD10-I70.0). 4. Infrarenal abdominal aortic ectasia at 3.1 cm. Recommend followup by ultrasound in 3 years. This recommendation follows ACR consensus guidelines:  White Paper of the ACR Incidental Findings Committee II on Vascular Findings. J Am Coll Radiol 2013; 10:789-794. Aortic aneurysm NOS (ICD10-I71.9). Electronically Signed   By: Jeronimo GreavesKyle  Talbot M.D.   On: 12/10/2018 18:16   Dg Chest Port 1 View  Result Date: 12/10/2018 CLINICAL DATA:  Vomiting. Fall. Hypertension. Pacemaker. Ex-smoker. EXAM: PORTABLE CHEST 1 VIEW COMPARISON:  03/30/2018 report. 03/27/2018 images and report. FINDINGS: Dual lead pacer. Midline trachea. Moderate cardiomegaly. Atherosclerosis in the transverse aorta. Right costophrenic angle minimally excluded. No pleural effusion or pneumothorax. No congestive failure. Suggestion of mild hyperinflation with interstitial thickening. No lobar consolidation. IMPRESSION: Cardiomegaly without congestive failure. Mild hyperinflation. Aortic Atherosclerosis (ICD10-I70.0).  In Electronically Signed   By: Jeronimo GreavesKyle  Talbot M.D.   On: 12/10/2018 16:59   [place] Abdomen Limited Ruq  Result Date: 12/10/2018 CLINICAL DATA:  85 y/o  M; elevated liver function test. EXAM: ULTRASOUND ABDOMEN LIMITED RIGHT UPPER QUADRANT COMPARISON:  None. FINDINGS: Gallbladder: Cholecystectomy. Common bile duct: Diameter: 11.7 mm Liver: Intrahepatic biliary ductal dilatation. No focal liver abnormality identified. Portal vein is patent on color Doppler imaging with normal direction of blood flow towards the liver. IMPRESSION: Intra and extrahepatic biliary ductal dilatation as seen on prior CT of abdomen and pelvis. The distal common bile duct is not visible due to bowel gas shadow. Electronically Signed   By: Mitzi HansenLance  Furusawa-Stratton M.D.   On: 12/10/2018 21:53        Scheduled Meds:  amiodarone  200 mg Oral Daily   DULoxetine  40 mg Oral Daily   ezetimibe  10 mg Oral Daily   indomethacin  100 mg Rectal Once   irbesartan  150 mg Oral Daily   polyethylene glycol  17 g Oral Daily   tamsulosin  0.4 mg Oral Daily   Continuous Infusions:    LOS: 2 days    Time spent:  35 mins.More than 50% of that time was spent in counseling and/or coordination of care.      Burnadette PopAmrit Johniece Hornbaker, MD [facility] Pager [phone]  If 7PM-7AM, please contact night-coverage [email or web address] Password [email or web address] 12/12/2018, 2:02 PM

## 2018-12-12 NOTE — Progress Notes (Signed)
PT Cancellation Note  Patient Details Name: Andrew Soto. MRN: [id number] DOB: 11/05/1931   Cancelled Treatment:    Reason Eval/Treat Not Completed: Patient at procedure or test/unavailable. Pt undergoing ERCP with propofol. Acute PT to return as able, as appropriate once patient post surgery.  Andrew Soto, PT, DPT Acute Rehabilitation Services Pager #: [phone] Office #: [phone]    Andrew Soto 12/12/2018, 7:23 AM

## 2018-12-12 NOTE — Progress Notes (Signed)
PT had elevated bps through out night shift. They would come down with medication but come back up. At 2000, 0000, and 0400 pt was A&O x4, at 0600 he was only oriented to self. Was not able to to reorient pt. Provider on call text paged. Katherina Right RN

## 2018-12-12 NOTE — Op Note (Signed)
PATIENT: Andrew Soto OF SURGERY: 12/12/18   PRE-OPERATIVE DIAGNOSIS:  Right subdural hematoma   POST-OPERATIVE DIAGNOSIS:  Right subdural hematoma   PROCEDURE:  Right craniotomy for evacuation of subdural hematoma   SURGEON:  Surgeon(s) and Role:    Judith Part, MD - Primary   ANESTHESIA: ETGA   BRIEF HISTORY: This is a 85 year old man who presented after a fall. He was being monitored as an inpatient for a subdural hematoma, which expanded and he became obtunded and hemiplegic. This was discussed with the patient's family as well as risks, benefits, and alternatives and wished to proceed with surgical treatment.   OPERATIVE DETAIL: The patient was taken to the operating room and placed on the OR table in the supine position. A formal time out was performed with two patient identifiers and confirmed the operative site. Anesthesia was induced by the anesthesia team. The operative site was marked, hair was clipped with surgical clippers, the area was then prepped and draped in a sterile fashion. A standard inverted question mark trauma incision was created. A standard trauma flap was made with a high speed drill. After durotomy, acute blood was found under high pressure. It was extensive and extended from the floor of the middle and anterior fossas to the occipital convexity and falx. This was evacuated with multiple veins that appeared to be adherent to the dura and bleeding. Hemostasis was obtained. Given the significant amount of blood and multiple areas of previous bleeding, I elected to place a large subdural drain in case a new area began to bleed. A burr hole was made for a #7 flat drain and it was tunneled. The dura was re-approximated and the bone flap was replaced, with attention to make sure it was easy to pull the drain out. All counts were correct and the incision was closed in layers. The patient was then returned to anesthesia for emergence. No apparent  complications at the completion of the procedure.   EBL:  150mL   DRAINS: #7 flat drain with JP bulb in the subdural space   SPECIMENS: none   Judith Part, MD 12/12/18 5:49 PM

## 2018-12-12 NOTE — Anesthesia Procedure Notes (Signed)
Procedure Name: Intubation Date/Time: 12/12/2018 4:22 PM Performed by: Orlie Dakin, CRNA Pre-anesthesia Checklist: Patient identified, Emergency Drugs available, Suction available and Patient being monitored Patient Re-evaluated:Patient Re-evaluated prior to induction Oxygen Delivery Method: Circle system utilized Preoxygenation: Pre-oxygenation with 100% oxygen Induction Type: IV induction and Rapid sequence Laryngoscope Size: Miller and 3 Grade View: Grade I Tube type: Oral Tube size: 8.0 mm Number of attempts: 1 Airway Equipment and Method: Stylet Placement Confirmation: ETT inserted through vocal cords under direct vision,  positive ETCO2 and breath sounds checked- equal and bilateral Secured at: 25 cm Tube secured with: Tape Dental Injury: Teeth and Oropharynx as per pre-operative assessment  Comments: RSI, poor dentition, none observed loose in front, top, bottom.

## 2018-12-12 NOTE — Progress Notes (Addendum)
Neurosurgery Service Progress Note  Subjective: More confused this morning  Objective: Vitals:   12/12/18 1007 12/12/18 1200 12/12/18 1208 12/12/18 1213  BP: (!) 167/71 (!) 180/62 (!) 166/56 (!) 152/49  Pulse: 82  79 78  Resp: (!) 23 (!) 27 (!) 22 (!) 25  Temp:  98.3 F (36.8 C)    TempSrc:      SpO2: 98% 98% 96% 95%  Weight:      Height:       Temp (24hrs), Avg:98.4 F (36.9 C), Min:97.8 F (36.6 C), Max:99.2 F (37.3 C)  CBC Latest Ref Rng & Units 12/11/2018 12/10/2018 08/14/2018  WBC 4.0 - 10.5 K/uL 12.3(H) 9.8 9.9  Hemoglobin 13.0 - 17.0 g/dL 16.113.0 09.613.7 04.514.5  Hematocrit 39.0 - 52.0 % 39.1 41.6 42.4  Platelets 150 - 400 K/uL 235 232 220   BMP Latest Ref Rng & Units 12/12/2018 12/11/2018 12/10/2018  Glucose 70 - 99 mg/dL 409(W130(H) 119(J160(H) 478(G143(H)  BUN 8 - 23 mg/dL 17 13 14   Creatinine 0.61 - 1.24 mg/dL 9.560.81 2.130.78 0.860.70  BUN/Creat Ratio 6 - 22 (calc) - - -  Sodium 135 - 145 mmol/L 133(L) 133(L) 132(L)  Potassium 3.5 - 5.1 mmol/L 4.0 4.1 4.2  Chloride 98 - 111 mmol/L 96(L) 97(L) 100  CO2 22 - 32 mmol/L 22 24 23   Calcium 8.9 - 10.3 mg/dL 9.4 9.0 9.0    Intake/Output Summary (Last 24 hours) at 12/12/2018 1317 Last data filed at 12/12/2018 1100 Gross per 24 hour  Intake 390.18 ml  Output 150 ml  Net 240.18 ml    Current Facility-Administered Medications:  .  amiodarone (PACERONE) tablet 200 mg, 200 mg, Oral, Daily, Adhikari, Amrit, MD .  DULoxetine (CYMBALTA) DR capsule 40 mg, 40 mg, Oral, Daily, Adhikari, Amrit, MD, 40 mg at 12/11/18 1631 .  ezetimibe (ZETIA) tablet 10 mg, 10 mg, Oral, Daily, Adhikari, Amrit, MD, 10 mg at 12/11/18 1631 .  famotidine (PEPCID) tablet 20 mg, 20 mg, Oral, BID PRN, Renford DillsAdhikari, Amrit, MD .  indomethacin (INDOCIN) 50 MG suppository 100 mg, 100 mg, Rectal, Once, Gribbin, Sarah J, PA-C .  irbesartan (AVAPRO) tablet 150 mg, 150 mg, Oral, Daily, Adhikari, Amrit, MD .  labetalol (NORMODYNE) injection 10 mg, 10 mg, Intravenous, Q2H PRN, Burnadette PopAdhikari, Amrit, MD, 10 mg at  12/12/18 1209 .  ondansetron (ZOFRAN) tablet 4 mg, 4 mg, Oral, Q6H PRN **OR** ondansetron (ZOFRAN) injection 4 mg, 4 mg, Intravenous, Q6H PRN, Garba, Mohammad L, MD .  polyethylene glycol (MIRALAX / GLYCOLAX) packet 17 g, 17 g, Oral, Daily, Adhikari, Amrit, MD, 17 g at 12/11/18 0916 .  tamsulosin (FLOMAX) capsule 0.4 mg, 0.4 mg, Oral, Daily, Adhikari, Amrit, MD, 0.4 mg at 12/11/18 1631   Physical Exam: Ox1, somnolent, dysarthric, strong L sided neglect, FC on R, +tone in the LUE but not moving it to command or when it's shown to him  Assessment & Plan: [age] y.o. man s/p fall with large R SDH, on ASA81.  -rpt CTH shows enlargement of SDH, which now appears to be significantly symptomatic, potentially more susceptible due to his prior stroke on that side. I called and spoke to his son, explained that I would recommend a craniotomy for evacuation and hemostasis, but given the patient's advanced age and co-morbidities, I think he has a very high perioperative risk of mortality and morbidity. We had a conference call with his entire family and I explained he has a high chance of post-operative complication. They would like to proceed with  aggressive care and surgery. I explained he is likely going to remain intubated post-op with a difficult course, which they acknowledged.  -OR now for R craniotomy for hematoma evacuation    Judith Part  12/12/18 1:17 PM

## 2018-12-12 NOTE — Anesthesia Preprocedure Evaluation (Addendum)
Anesthesia Evaluation  Patient identified by MRN, date of birth, ID band Patient confused    Reviewed: Allergy & Precautions, Patient's Chart, lab work & pertinent test results  History of Anesthesia Complications Negative for: history of anesthetic complications  Airway Mallampati: II  TM Distance: >3 FB Neck ROM: Full    Dental no notable dental hx. (+) Dental Advisory Given, Caps   Pulmonary neg pulmonary ROS, former smoker,    Pulmonary exam normal        Cardiovascular hypertension, + dysrhythmias Atrial Fibrillation + pacemaker  Rhythm:Regular Rate:Tachycardia     Neuro/Psych PSYCHIATRIC DISORDERS Depression CVA    GI/Hepatic negative GI ROS, Neg liver ROS, pancreatitis   Endo/Other  negative endocrine ROS  Renal/GU negative Renal ROS     Musculoskeletal negative musculoskeletal ROS (+)   Abdominal   Peds  Hematology negative hematology ROS (+)   Anesthesia Other Findings Day of surgery medications reviewed with the patient.  Reproductive/Obstetrics                          Anesthesia Physical Anesthesia Plan  ASA: III and emergent  Anesthesia Plan: General   Post-op Pain Management:    Induction: Intravenous and Rapid sequence  PONV Risk Score and Plan: 2 and Ondansetron, Dexamethasone and Treatment may vary due to age or medical condition  Airway Management Planned: Oral ETT  Additional Equipment: Arterial line  Intra-op Plan:   Post-operative Plan: Possible Post-op intubation/ventilation  Informed Consent:   Plan Discussed with:   Anesthesia Plan Comments:         Anesthesia Quick Evaluation

## 2018-12-12 NOTE — Consult Note (Addendum)
NAME:  Andrew Cisek., MRN:  [id number], DOB:  06-12-1931, LOS: 2 ADMISSION DATE:  12/10/2018, CONSULTATION DATE:  7/7 REFERRING MD:  Dr. Tawanna Solo, CHIEF COMPLAINT:  SDH   Brief History   85 year old male admitted with SHD/SAH that was initially stable on 7/5. Mental status declined on 7/7 with progressive worsening on CT head. Taken to OR for evacuation and post-operatively was transferred to ICU on vent.   History of present illness   85 year old male with past medical history as below, which is significant for atrial fibrillation not on anticoagulation, aortic stenosis, CVA, colon cancer, hypertension, and recurrent pancreatitis.  He presented to [facility] emergency department on 7/5 after several falls at home.  CT of the head done in the emergency department demonstrated large right-sided subdural hematoma with right to left midline shift.  He had elevated alk phos and LFTs.  CT of the abdomen demonstrated a stent within the distal common duct with intra-and extrahepatic biliary duct dilation proximal to the stent.  His mental status was well enough that he was able to be admitted to the progressive care unit under the hospitalist with neurosurgery consultation.  Neurosurgery at that time did not feel need for any surgical intervention.  He was worked up by gastroenterology with plans for ERCP, however, in the morning hours of 7/7 his mental status began to deteriorate.  CT scan of the head was repeated and showed worsening of right-sided subdural hematoma.  He was taken to the OR for evacuation.  Postoperatively he remained on the ventilator.  Past Medical History   has a past medical history of Actinic keratoses, Aortic stenosis, Atrial fibrillation ([place]), CVA (cerebral vascular accident) ([place]), High cholesterol, History of colon cancer, History of CVA (cerebrovascular accident), Hypertension, Lower urinary tract symptoms (LUTS), and Pancreatitis, recurrent.  Significant Hospital Events    7/5 admit for subdural hematoma 7/7 to OR for evacuation, to ICU on vent postop.  Consults:  Neurosurgery PCCM  Procedures:  7/7 right craniotomy for evacuation of subdural hematoma 7/7 ET tube  Significant Diagnostic Tests:  7/5 CT head, C-spine > large subdural hematoma with associated 5 mm right to left midline shift.  Subarachnoid hemorrhage with focus in the right parietal cortex.  Small left subdural hematoma.  Severe multilevel degenerative disc disease in the cervical spine. 7/5 CT abdomen pelvis > stent within the distal common duct.  Intra-and extrahepatic biliary duct dilation about the stent.  Infrarenal abdominal aortic ectasia. 7/5 abdominal ultrasound RUQ> intra-and intrahepatic biliary ductal dilation. 7/7 CT head> mildly increased size of right larger than left subdural hematomas.  Mildly increased small volume subarachnoid hemorrhage.  Unchanged 5 mm leftward midline shift.  Micro Data:    Antimicrobials:  Perioperative Bactrim and cefazolin 7/7  Interim history/subjective:    Objective   Blood pressure (!) 66/43, pulse 64, temperature 98.3 F (36.8 C), resp. rate (!) 23, height '5\' 6"'  (1.676 m), weight 63.9 kg, SpO2 100 %.    Vent Mode: PRVC FiO2 (%):  [50 %] 50 % Set Rate:  [18 bmp] 18 bmp Vt Set:  [510 mL] 510 mL PEEP:  [5 cmH20] 5 cmH20 Plateau Pressure:  [15 cmH20-16 cmH20] 15 cmH20   Intake/Output Summary (Last 24 hours) at 12/12/2018 2001 Last data filed at 12/12/2018 1900 Gross per 24 hour  Intake 1843.97 ml  Output 875 ml  Net 968.97 ml   Filed Weights   12/10/18 1325 12/10/18 2357 12/12/18 0704  Weight: 72.6 kg 63.9  kg 63.9 kg    Examination: General: Elderly male on vent HENT: Surgical incision over right scalp closed with staples. Lungs: Clear bilateral breath sounds Cardiovascular: RRR, no MRG Abdomen: Soft, nontender, nondistended Extremities: No acute deformity Neuro: Sedated   Resolved Hospital Problem list     Assessment &  Plan:   Subdural hematoma: Large right-sided subdural hematoma, small left-sided subdural hematoma.  5 mm right to left midline shift.  Status post craniotomy and evacuation on 7/7 Subarachnoid hemorrhage: Focus over the right parietal -Management per neurosurgery -Keep systolic blood pressure less than 160 -PRN hydralazine, labetalol  Acute encephalopathy secondary to above: complicated by inability to protect airway.  - Full vent support for airway protection.  - ABG, CXR reviewed - VAP bundle - SBT in AM - Fentanyl infusion for RASS 0 to -1. Could not tolerate propofol due to hypotension.   Elevated LFT: With history of recurrent hepatitis.  Remote biliary stent remains in place.  Was scheduled for ERCP, however, neurologic developments have delayed this. -Management per GI -N.p.o.  History of hypertension -Holding outpatient medications in lieu of PRN IV BP control  Atrial fibrillation: Currently in sinus. No anticoagulation at bedside.  - Telemetry monitoring  - Continue home amiodarone once taking PO  Poor prognosis: palliative care has been previously consulted by [facility].    Best practice:  Diet: NPO Pain/Anxiety/Delirium protocol (if indicated): Fentanyl infusion per PAD protocol VAP protocol (if indicated): Yes DVT prophylaxis: No GI prophylaxis: Pepcid Glucose control: Na Mobility: BR Code Status: FULL Family Communication:  Disposition: ICU  Labs   CBC: Recent Labs  Lab 12/10/18 1425 12/11/18 0239  WBC 9.8 12.3*  NEUTROABS 8.6*  --   HGB 13.7 13.0  HCT 41.6 39.1  MCV 101.2* 99.2  PLT 232 914    Basic Metabolic Panel: Recent Labs  Lab 12/10/18 1425 12/11/18 0239 12/12/18 0455  NA 132* 133* 133*  K 4.2 4.1 4.0  CL 100 97* 96*  CO2 '23 24 22  ' GLUCOSE 143* 160* 130*  BUN '14 13 17  ' CREATININE 0.70 0.78 0.81  CALCIUM 9.0 9.0 9.4   GFR: Estimated Creatinine Clearance: 60.2 mL/min (by C-G formula based on SCr of 0.81 mg/dL). Recent Labs  Lab  12/10/18 1425 12/11/18 0239  WBC 9.8 12.3*    Liver Function Tests: Recent Labs  Lab 12/10/18 1425 12/11/18 0239 12/12/18 0455  AST 98* 66* 51*  ALT 154* 129* 103*  ALKPHOS 701* 660* 613*  BILITOT 2.4* 1.7* 2.4*  PROT 5.8* 5.9* 6.4*  ALBUMIN 3.1* 3.0* 3.2*   No results for input(s): LIPASE, AMYLASE in the last 168 hours. No results for input(s): AMMONIA in the last 168 hours.  ABG No results found for: PHART, PCO2ART, PO2ART, HCO3, TCO2, ACIDBASEDEF, O2SAT   Coagulation Profile: No results for input(s): INR, PROTIME in the last 168 hours.  Cardiac Enzymes: No results for input(s): CKTOTAL, CKMB, CKMBINDEX, TROPONINI in the last 168 hours.  HbA1C: No results found for: HGBA1C  CBG: Recent Labs  Lab 12/10/18 1322  GLUCAP 136*    Review of Systems:   Unable as patient is encephalopathic and intubated.   Past Medical History  He,  has a past medical history of Actinic keratoses, Aortic stenosis, Atrial fibrillation ([place]), CVA (cerebral vascular accident) [place]), High cholesterol, History of colon cancer, History of CVA (cerebrovascular accident), Hypertension, Lower urinary tract symptoms (LUTS), and Pancreatitis, recurrent.   Surgical History    Past Surgical History:  Procedure Laterality Date  .  ABDOMINAL SURGERY    . CHOLECYSTECTOMY    . COLON RESECTION    . MOHS SURGERY     Right eyebrow  . PACEMAKER IMPLANT    . VALVE REPLACEMENT       Social History   reports that he has quit smoking. His smoking use included cigarettes. He has a 5.00 pack-year smoking history. He has never used smokeless tobacco. He reports current alcohol use. He reports that he does not use drugs.   Family History   His family history includes Testicular cancer in his father.   Allergies Allergies  Allergen Reactions  . Epinephrine Other (See Comments)    Dizziness/double vision  . Penicillins Rash    Did it involve swelling of the face/tongue/throat, SOB, or low BP?  Unknown Did it involve sudden or severe rash/hives, skin peeling, or any reaction on the inside of your mouth or nose? Unknown Did you need to seek medical attention at a hospital or doctor's office? Unknown When did it last happen?unk If all above answers are "NO", may proceed with cephalosporin use.   . Vicodin [Hydrocodone-Acetaminophen] Nausea Only and Other (See Comments)    Mood changes, Fatigue     Home Medications  Prior to Admission medications   Medication Sig Start Date End Date Taking? Authorizing Provider  acetaminophen (TYLENOL) 650 MG CR tablet Take 1,300 mg by mouth 2 (two) times a day.   Yes [provider]  AMBULATORY NON [place] wheelchair. Disp 1 R26.9 08/22/18  Yes Gregor Hams, MD  amiodarone (PACERONE) 200 MG tablet TAKE 1 TABLET BY MOUTH EVERY DAY Patient taking differently: Take 200 mg by mouth at bedtime.  10/05/18  Yes Trixie Dredge, PA-C  aspirin 81 MG tablet Take 81 mg by mouth at bedtime.    Yes [provider]  bisacodyl (DULCOLAX) 5 MG EC tablet Take 5 mg by mouth 2 (two) times a day.   Yes [provider]  Cholecalciferol (VITAMIN D3 PO) Take 1 tablet by mouth at bedtime.    Yes [provider]  cyanocobalamin ([employer] VITAMIN B12) 1000 MCG tablet Take 1,000 mcg by mouth daily.    Yes [provider]  DULoxetine (CYMBALTA) 20 MG capsule Take 2 capsules (40 mg total) by mouth daily. 08/15/18  Yes Gregor Hams, MD  ezetimibe (ZETIA) 10 MG tablet TAKE 1 TABLET BY MOUTH EVERY DAY Patient taking differently: Take 10 mg by mouth at bedtime.  10/09/18  Yes Trixie Dredge, PA-C  famotidine (PEPCID) 20 MG tablet Take 1 tablet (20 mg total) by mouth 2 (two) times daily as needed for heartburn or indigestion. 09/04/18  Yes Trixie Dredge, PA-C  NONFORMULARY OR COMPOUNDED ITEM Gel donut cushion 09/04/18  Yes Trixie Dredge, [place]  NONFORMULARY OR  COMPOUNDED ITEM Gel mattress topper for pressure relief 09/04/18  Yes Trixie Dredge, [place]  tamsulosin (FLOMAX) 0.4 MG CAPS capsule TAKE 1 CAPSULE BY MOUTH EVERY DAY Patient taking differently: Take 0.4 mg by mouth daily.  07/17/18  Yes Trixie Dredge, PA-C  valsartan (DIOVAN) 80 MG tablet Take 1 tablet (80 mg total) by mouth daily. 10/25/18  Yes Lelon Perla, MD     Critical care time: 25 mins     Georgann Housekeeper, AGACNP-BC [place] Pager [phone] or [phone]  12/12/2018 8:27 PM

## 2018-12-12 NOTE — Progress Notes (Signed)
Pt arrived from OR to 4N28 being bagged with an 8.0 at 25cm at the lip. Pt placed on ventilator at full support settings. PRVC 510/18/50%/5. Pink tape removed and commercial tube holder placed on pt and 8.0 ETT secured at 25cm.

## 2018-12-12 NOTE — Progress Notes (Addendum)
Patient transfer to 4N28 around 11.  Blood pressure remain high after PRN x 2.  It was noted per CRNA report that patient's blood pressure fluctuates.  Dr. Zada Finders paged regarding blood pressure.  Per verbal, systolic blood pressure less than 160.  New orders received.  JP drain will have large output per Dr. Zada Finders.  RN to continue to monitor.   Around 1900, patient's became hypotensive.  E-Link RN paged and made aware of situation and will notify oncoming E-Link MD.  No new orders at this time.

## 2018-12-12 NOTE — Anesthesia Procedure Notes (Signed)
Arterial Line Insertion Start/End7/12/2018 4:24 PM, 12/12/2018 4:30 PM Performed by: Oletta Lamas, CRNA, CRNA  Patient location: OR. Preanesthetic checklist: patient identified, IV checked, risks and benefits discussed, surgical consent, monitors and equipment checked and pre-op evaluation Lidocaine 1% used for infiltration Right, radial was placed Catheter size: 20 G Hand hygiene performed  and maximum sterile barriers used   Attempts: 2 Procedure performed without using ultrasound guided technique. Following insertion, dressing applied and Biopatch. Post procedure assessment: normal  Patient tolerated the procedure well with no immediate complications. Additional procedure comments: 1st attempt [place], CRNA left radial artery, unsuccessful., pressure held.Marland Kitchen

## 2018-12-12 NOTE — Progress Notes (Signed)
OT Cancellation Note  Patient Details Name: Andrew Soto. MRN: [id number] DOB: 1932-06-07   Cancelled Treatment:    Reason Eval/Treat Not Completed: Patient at procedure or test/ unavailable. Pt undergoing stat CT for MS changes. Will follow up as appropriate.   Ramond Dial, OT/L   Acute OT Clinical Specialist Acute Rehabilitation Services Pager [phone] Office [phone]  12/12/2018, 8:54 AM

## 2018-12-12 NOTE — Progress Notes (Signed)
RT NOTE:  Pt transported to CT3 and back to 4N28 without event.

## 2018-12-12 NOTE — Brief Op Note (Signed)
12/12/2018  5:48 PM  PATIENT:  Luvenia Heller.  85 y.o. male  PRE-OPERATIVE DIAGNOSIS:  Subdural hematoma  POST-OPERATIVE DIAGNOSIS:  Subdural hematoma  PROCEDURE:  Procedure(s) with comments: CRANIOTOMY FOR EVACUATION OF SUBDURAL HEMATOMA (Right) - CRANIOTOMY HEMATOMA EVACUATION SUBDURAL  SURGEON:  Surgeon(s) and Role:    * Marykathryn Carboni, Joyice Faster, MD - Primary  PHYSICIAN ASSISTANT:   ASSISTANTS: none   ANESTHESIA:   general  EBL:  100 mL   BLOOD ADMINISTERED:none  DRAINS: Subdural drain   LOCAL MEDICATIONS USED:  LIDOCAINE   SPECIMEN:  No Specimen  DISPOSITION OF SPECIMEN:  N/A  COUNTS:  YES  TOURNIQUET:  * No tourniquets in log *  DICTATION: .Note written in EPIC  PLAN OF CARE: Admit to inpatient   PATIENT DISPOSITION:  ICU - intubated and hemodynamically stable.   Delay start of Pharmacological VTE agent (>24hrs) due to surgical blood loss or risk of bleeding: yes

## 2018-12-12 NOTE — OR Nursing (Signed)
Patient arrived to OR suite with unattached condom catheter. Attempted to chart removal of external catheter but no record in chart to do so. A rectal tube was charted but patient did not have one upon arrival to OR.

## 2018-12-13 ENCOUNTER — Inpatient Hospital Stay (HOSPITAL_COMMUNITY): Payer: Medicare Other

## 2018-12-13 ENCOUNTER — Encounter (HOSPITAL_COMMUNITY): Payer: Self-pay | Admitting: Neurological Surgery

## 2018-12-13 DIAGNOSIS — J9601 Acute respiratory failure with hypoxia: Secondary | ICD-10-CM

## 2018-12-13 LAB — COMPREHENSIVE METABOLIC PANEL
ALT: 60 U/L — ABNORMAL HIGH (ref 0–44)
AST: 32 U/L (ref 15–41)
Albumin: 2.3 g/dL — ABNORMAL LOW (ref 3.5–5.0)
Alkaline Phosphatase: 370 U/L — ABNORMAL HIGH (ref 38–126)
Anion gap: 8 (ref 5–15)
BUN: 31 mg/dL — ABNORMAL HIGH (ref 8–23)
CO2: 21 mmol/L — ABNORMAL LOW (ref 22–32)
Calcium: 8 mg/dL — ABNORMAL LOW (ref 8.9–10.3)
Chloride: 105 mmol/L (ref 98–111)
Creatinine, Ser: 1.04 mg/dL (ref 0.61–1.24)
GFR calc Af Amer: >60 mL/min (ref >60)
GFR calc non Af Amer: >60 mL/min (ref >60)
Glucose, Bld: 150 mg/dL — ABNORMAL HIGH (ref 70–99)
Potassium: 3.6 mmol/L (ref 3.5–5.1)
Sodium: 134 mmol/L — ABNORMAL LOW (ref 135–145)
Total Bilirubin: 1.5 mg/dL — ABNORMAL HIGH (ref 0.3–1.2)
Total Protein: 4.8 g/dL — ABNORMAL LOW (ref 6.5–8.1)

## 2018-12-13 MED ORDER — FAMOTIDINE IN NACL 20-0.9 MG/50ML-% IV SOLN
20.0000 mg | Freq: Two times a day (BID) | INTRAVENOUS | Status: DC
  Administered 2018-12-13 – 2018-12-14 (×3): 20 mg via INTRAVENOUS
  Filled 2018-12-13 (×3): qty 50

## 2018-12-13 MED ORDER — POLYETHYLENE GLYCOL 3350 17 G PO PACK: 17.0000 g | PACK | Freq: Every day | ORAL | Status: DC

## 2018-12-13 MED ORDER — AMIODARONE HCL 200 MG PO TABS
200.0000 mg | ORAL_TABLET | Freq: Every day | ORAL | Status: DC
  Administered 2018-12-13: 200 mg

## 2018-12-13 MED ORDER — EZETIMIBE 10 MG PO TABS
10.0000 mg | ORAL_TABLET | Freq: Every day | ORAL | Status: DC
  Administered 2018-12-13: 10:00:00 10 mg

## 2018-12-13 MED ORDER — DEXMEDETOMIDINE HCL IN NACL 400 MCG/100ML IV SOLN: 0.4000 ug/kg/h | INTRAVENOUS | Status: DC

## 2018-12-13 NOTE — Progress Notes (Signed)
Neurosurgery Service Progress Note  Subjective: OR overnight for SDH evac. As expected, had to be kept intubated post-op  Objective: Vitals:   12/13/18 0700 12/13/18 0715 12/13/18 0730 12/13/18 0812  BP: (!) 97/46 (!) 98/48 (!) 168/60 (!) 130/50  Pulse: (!) 58 (!) 57 62 (!) 53  Resp: '18 19 17 18  ' Temp:    98.1 F (36.7 C)  TempSrc:    Axillary  SpO2: 100% 100% 100% 100%  Weight:      Height:       Temp (24hrs), Avg:97.9 F (36.6 C), Min:97.5 F (36.4 C), Max:98.3 F (36.8 C)  CBC Latest Ref Rng & Units 12/12/2018 12/11/2018 12/10/2018  WBC 4.0 - 10.5 K/uL - 12.3(H) 9.8  Hemoglobin 13.0 - 17.0 g/dL 10.2(L) 13.0 13.7  Hematocrit 39.0 - 52.0 % 30.0(L) 39.1 41.6  Platelets 150 - 400 K/uL - 235 232   BMP Latest Ref Rng & Units 12/13/2018 12/12/2018 12/12/2018  Glucose 70 - 99 mg/dL 150(H) - 130(H)  BUN 8 - 23 mg/dL 31(H) - 17  Creatinine 0.61 - 1.24 mg/dL 1.04 - 0.81  BUN/Creat Ratio 6 - 22 (calc) - - -  Sodium 135 - 145 mmol/L 134(L) 135 133(L)  Potassium 3.5 - 5.1 mmol/L 3.6 3.6 4.0  Chloride 98 - 111 mmol/L 105 - 96(L)  CO2 22 - 32 mmol/L 21(L) - 22  Calcium 8.9 - 10.3 mg/dL 8.0(L) - 9.4    Intake/Output Summary (Last 24 hours) at 12/13/2018 0839 Last data filed at 12/13/2018 0700 Gross per 24 hour  Intake 2377.13 ml  Output 1610 ml  Net 767.13 ml    Current Facility-Administered Medications:  .  amiodarone (PACERONE) tablet 200 mg, 200 mg, Oral, Daily, Kristiana Jacko A, MD .  chlorhexidine gluconate (MEDLINE KIT) (PERIDEX) 0.12 % solution 15 mL, 15 mL, Mouth Rinse, BID, Corey Harold, NP, 15 mL at 12/13/18 0818 .  Chlorhexidine Gluconate Cloth 2 % PADS 6 each, 6 each, Topical, Daily, Corey Harold, NP .  clevidipine (CLEVIPREX) infusion 0.5 mg/mL, 0-21 mg/hr, Intravenous, Continuous, Bookert Guzzi A, MD .  dexmedetomidine (PRECEDEX) 400 MCG/100ML (4 mcg/mL) infusion, 0.4-1.2 mcg/kg/hr, Intravenous, Titrated, Jashad Depaula A, MD .  dextrose 5 %-0.45 % sodium  chloride infusion, , Intravenous, Continuous, Kayleigh Broadwell, Joyice Faster, MD, Last Rate: 50 mL/hr at 12/12/18 2011 .  DULoxetine (CYMBALTA) DR capsule 40 mg, 40 mg, Oral, Daily, Judith Part, MD, 40 mg at 12/11/18 1631 .  ezetimibe (ZETIA) tablet 10 mg, 10 mg, Oral, Daily, Sequoyah Ramone, Joyice Faster, MD, 10 mg at 12/11/18 1631 .  famotidine (PEPCID) tablet 20 mg, 20 mg, Oral, BID PRN, Judith Part, MD .  fentaNYL (SUBLIMAZE) bolus via infusion 25 mcg, 25 mcg, Intravenous, Q15 min PRN, Corey Harold, NP, 25 mcg at 12/12/18 2101 .  fentaNYL 2521mg in NS 2549m(1049mml) infusion-PREMIX, 25-200 mcg/hr, Intravenous, Continuous, HofCorey HaroldP, Last Rate: 10 mL/hr at 12/12/18 2242, 100 mcg/hr at 12/12/18 2242 .  hydrALAZINE (APRESOLINE) injection 10-20 mg, 10-20 mg, Intravenous, Q4H PRN, ElsKristeen MissD, 20 mg at 12/12/18 1900 .  indomethacin (INDOCIN) 50 MG suppository 100 mg, 100 mg, Rectal, Once, Aadyn Buchheit A, MD .  labetalol (NORMODYNE) injection 10 mg, 10 mg, Intravenous, Q2H PRN, OstJudith PartD, 10 mg at 12/13/18 0736 .  MEDLINE mouth rinse, 15 mL, Mouth Rinse, 10 times per day, HofCorey HaroldP, 15 mL at 12/13/18 0634 .  ondansetron (ZOFRAN) tablet 4 mg, 4 mg, Oral,  Q6H PRN **OR** ondansetron (ZOFRAN) injection 4 mg, 4 mg, Intravenous, Q6H PRN, Aylinn Rydberg A, MD .  polyethylene glycol (MIRALAX / GLYCOLAX) packet 17 g, 17 g, Oral, Daily, Judith Part, MD, 17 g at 12/11/18 0916 .  tamsulosin (FLOMAX) capsule 0.4 mg, 0.4 mg, Oral, Daily, Kartik Fernando, Joyice Faster, MD, 0.4 mg at 12/11/18 1631   Physical Exam: Intubated, eyes open to stim and gets very active in [place] / BLE, tries to roll out of bed, has tone in the LUE and pushes with good strength, not purposefuly  Drain w/ serosang content  Assessment & Plan: 85 y.o. man s/p fall with large R SDH, on ASA81. 7/7 worsening AMS, CTH confirms expansion of R SDH, 7/7 s/p R craniotomy for SDH evacuation, post-op CTH  with good evacuation, pneumocephalus, mild expansion of L SDH   -continue drain, high output as expected -CCM consulted, appreciate recs -contralateral expansion always a concern when evacuating a SDH in the setting of b/l SDH, will follow clinically and if unable to be extubated, repeat CTH tomorrow. If his L SDH expands and becomes symptomatic, I will advise the family that I do not think bilateral craniotomies are appropriate at this age. -recommend SBP<160 for 24h post-op, was somewhat difficult to control preop -hold SQH until 12/14/18  Judith Part  12/13/18 8:39 AM

## 2018-12-13 NOTE — Progress Notes (Addendum)
OT/ PT  Cancellation Note  Patient Details Name: Andrew Soto. MRN: [id number] DOB: 10/17/1931   Cancelled Treatment:    Reason Eval/Treat Not Completed: Patient not medically ready Rn requesting therapy to hold pending possible extubation and current agitation.   Jeri Modena, OTR/L  Acute Rehabilitation Services Pager: [phone] Office: [phone] .   Jeri Modena 12/13/2018, 9:51 AM

## 2018-12-13 NOTE — Progress Notes (Signed)
Palliative:  Consult received and chart reviewed thoroughly.  Received update from RN - patient extubated around 3 pm, tolerating okay but mental status remains a concern - not communicating, not following commands, agitated.   Attempted to speak with patient's son to initiate goals of care discussions - no answer - voicemail left with call back number. Will attempt to call again tomorrow.  Thank you for this consult.  Juel Burrow, DNP, AGNP-C Palliative Medicine Team Team Phone # [phone]  Pager # [phone]  NO CHARGE

## 2018-12-13 NOTE — Anesthesia Postprocedure Evaluation (Signed)
Anesthesia Post Note  Patient: Andrew Soto.  Procedure(s) Performed: CRANIOTOMY FOR EVACUATION OF SUBDURAL HEMATOMA (Right Head)     Patient location during evaluation: SICU Anesthesia Type: General Level of consciousness: sedated Pain management: pain level controlled Vital Signs Assessment: post-procedure vital signs reviewed and stable Respiratory status: patient remains intubated per anesthesia plan Cardiovascular status: stable Postop Assessment: no apparent nausea or vomiting Anesthetic complications: no                 Shanieka Blea,Abenezer DANIEL

## 2018-12-13 NOTE — Consult Note (Signed)
NAME:  Andrew IllJames M Lefebre Jr., MRN:  [id number], DOB:  Sep 08, 1931, LOS: 3 ADMISSION DATE:  12/10/2018, CONSULTATION DATE:  7/7 REFERRING MD:  Dr. Renford DillsAdhikari, CHIEF COMPLAINT:  SDH   Brief History   85 year old male admitted with SHD/SAH that was initially stable on 7/5. Mental status declined on 7/7 with progressive worsening on CT head. Taken to OR for evacuation and post-operatively was transferred to ICU on vent.  Presented after fall at home. Initially planned medical management. Incidental finding of elevated transaminases and was due to have possible ERCP to evaluate biliary duct dilatation.   Past Medical History   has a past medical history of Actinic keratoses, Aortic stenosis, Atrial fibrillation (HCC), CVA (cerebral vascular accident) (HCC), High cholesterol, History of colon cancer, History of CVA (cerebrovascular accident), Hypertension, Lower urinary tract symptoms (LUTS), and Pancreatitis, recurrent.  Significant Hospital Events   7/5 admit for subdural hematoma 7/7 to OR for evacuation, to ICU on vent postop.  Consults:  Neurosurgery PCCM  Procedures:  7/7 right craniotomy for evacuation of subdural hematoma 7/7 ET tube  Significant Diagnostic Tests:  7/5 CT head, C-spine > large subdural hematoma with associated 5 mm right to left midline shift.  Subarachnoid hemorrhage with focus in the right parietal cortex.  Small left subdural hematoma.  Severe multilevel degenerative disc disease in the cervical spine. 7/5 CT abdomen pelvis > stent within the distal common duct.  Intra-and extrahepatic biliary duct dilation about the stent.  Infrarenal abdominal aortic ectasia. 7/5 abdominal ultrasound RUQ> intra-and intrahepatic biliary ductal dilation. 7/7 CT head> mildly increased size of right larger than left subdural hematomas.  Mildly increased small volume subarachnoid hemorrhage.  Unchanged 5 mm leftward midline shift.  Micro Data:  N/A  Antimicrobials:  Perioperative  Bactrim and cefazolin 7/7  Interim history/subjective:  Periodic agitation. Patient tried to sit up in bed. Not following commands.  Dr Maurice Smallstergard has informed family of poor prognosis.  Objective   Blood pressure (!) 168/60, pulse 62, temperature (!) 97.5 F (36.4 C), temperature source Axillary, resp. rate 17, height 5\' 6"  (1.676 m), weight 63.9 kg, SpO2 100 %.    Vent Mode: PRVC FiO2 (%):  [30 %-50 %] 40 % Set Rate:  [18 bmp] 18 bmp Vt Set:  [510 mL] 510 mL PEEP:  [5 cmH20] 5 cmH20 Plateau Pressure:  [15 cmH20-18 cmH20] 18 cmH20   Intake/Output Summary (Last 24 hours) at 12/13/2018 0810 Last data filed at 12/13/2018 0700 Gross per 24 hour  Intake 2377.13 ml  Output 1610 ml  Net 767.13 ml   Filed Weights   12/10/18 1325 12/10/18 2357 12/12/18 0704  Weight: 72.6 kg 63.9 kg 63.9 kg    Examination: General: Elderly male on vent. Moderate cachexia.  HENT: Surgical incision over right scalp closed with staples.  Minimal drainage from JP. ETT and OGT in place.  Lungs: Clear bilateral breath sounds. Apneic on PSV ventilation with fentanyl at 3875mcg/h. Cardiovascular: RRR, no MRG. Extremities warm.  Abdomen: Soft, nontender, nondistended Extremities: No acute deformity Neuro: Sedated with fentanyl. Presently minimal response to painful stimulation.    Resolved Hospital Problem list     Assessment & Plan:   Critically ill due to acute respiratory failure requiring mechanical ventilation.  No evidence of underlying lung disease at this time.  Unable to SBT on current dose of fentanyl -Continue to wean fentanyl to off and initiate SBT once able to initiate spontaneous breaths. -Start Precedex if becomes agitated.  Subdural hematoma: Large  right-sided subdural hematoma, small left-sided subdural hematoma.  5 mm right to left midline shift.  Status post craniotomy and evacuation on 7/7 Subarachnoid hemorrhage: Focus over the right parietal -Management per neurosurgery -Keep  systolic blood pressure less than 160 -PRN hydralazine, labetalol  Elevated LFT: With history of recurrent hepatitis.  Remote biliary stent remains in place.  Was scheduled for ERCP, however, neurologic developments have delayed this. -Management per GI -N.p.o.  Critically ill due to uncontrolled hypertension requiring titration of Cleviprex to prevent re-bleeding.  -continue to titrate to keep SBP<160. -establish enteral access and start home medications.  Atrial fibrillation: Currently in sinus. No anticoagulation at bedside.  - Telemetry monitoring  - Continue home amiodarone once taking PO  Poor prognosis: palliative care has been previously consulted by [facility]   Best practice:  Diet: NPO Pain/Anxiety/Delirium protocol (if indicated): Fentanyl infusion per PAD protocol VAP protocol (if indicated): Yes DVT prophylaxis: No GI prophylaxis: Pepcid Glucose control: Na Mobility: BR Code Status: FULL Family Communication:  Disposition: ICU  Labs   CBC: Recent Labs  Lab 12/10/18 1425 12/11/18 0239 12/12/18 2003  WBC 9.8 12.3*  --   NEUTROABS 8.6*  --   --   HGB 13.7 13.0 10.2*  HCT 41.6 39.1 30.0*  MCV 101.2* 99.2  --   PLT 232 235  --     Basic Metabolic Panel: Recent Labs  Lab 12/10/18 1425 12/11/18 0239 12/12/18 0455 12/12/18 2003 12/13/18 0500  NA 132* 133* 133* 135 134*  K 4.2 4.1 4.0 3.6 3.6  CL 100 97* 96*  --  105  CO2 23 24 22   --  21*  GLUCOSE 143* 160* 130*  --  150*  BUN 14 13 17   --  31*  CREATININE 0.70 0.78 0.81  --  1.04  CALCIUM 9.0 9.0 9.4  --  8.0*   GFR: Estimated Creatinine Clearance: 46.9 mL/min (by C-G formula based on SCr of 1.04 mg/dL). Recent Labs  Lab 12/10/18 1425 12/11/18 0239  WBC 9.8 12.3*    Liver Function Tests: Recent Labs  Lab 12/10/18 1425 12/11/18 0239 12/12/18 0455 12/13/18 0500  AST 98* 66* 51* 32  ALT 154* 129* 103* 60*  ALKPHOS 701* 660* 613* 370*  BILITOT 2.4* 1.7* 2.4* 1.5*  PROT 5.8* 5.9* 6.4* 4.8*   ALBUMIN 3.1* 3.0* 3.2* 2.3*   No results for input(s): LIPASE, AMYLASE in the last 168 hours. No results for input(s): AMMONIA in the last 168 hours.  ABG    Component Value Date/Time   PHART 7.396 12/12/2018 2003   PCO2ART 35.0 12/12/2018 2003   PO2ART 224.0 (H) 12/12/2018 2003   HCO3 21.6 12/12/2018 2003   TCO2 23 12/12/2018 2003   ACIDBASEDEF 3.0 (H) 12/12/2018 2003   O2SAT 100.0 12/12/2018 2003     Coagulation Profile: No results for input(s): INR, PROTIME in the last 168 hours.  Cardiac Enzymes: No results for input(s): CKTOTAL, CKMB, CKMBINDEX, TROPONINI in the last 168 hours.  HbA1C: No results found for: HGBA1C  CBG: Recent Labs  Lab 12/10/18 1322  GLUCAP 136*   CRITICAL CARE Performed by: Kipp Brood  Total critical care time: 40 minutes  Critical care time was exclusive of separately billable procedures and treating other patients.  Critical care was necessary to treat or prevent imminent or life-threatening deterioration.  Critical care was time spent personally by me on the following activities: development of treatment plan with patient and/or surrogate as well as nursing, discussions with consultants, evaluation of  patient's response to treatment, examination of patient, obtaining history from patient or surrogate, ordering and performing treatments and interventions, ordering and review of laboratory studies, ordering and review of radiographic studies, pulse oximetry, re-evaluation of patient's condition and participation in multidisciplinary rounds.  Lynnell Catalanavi Carron Mcmurry, MD [facility] ICU Physician [facility]  Pager: [phone] Mobile: [phone] After hours: [phone].   12/13/2018 8:10 AM

## 2018-12-13 NOTE — Progress Notes (Signed)
[facility] Progress Note    Since last GI note: Events yesterday noted.  He is still intubated.  Objective: Vital signs in last 24 hours: Temp:  [97.5 F (36.4 C)-98.3 F (36.8 C)] 98.1 F (36.7 C) (07/08 0812) Pulse Rate:  [52-118] 54 (07/08 0915) Resp:  [15-28] 20 (07/08 0915) BP: (66-225)/(36-133) 108/46 (07/08 0915) SpO2:  [94 %-100 %] 100 % (07/08 0915) Arterial Line BP: (95-221)/(30-65) 140/43 (07/08 0915) FiO2 (%):  [30 %-50 %] 40 % (07/08 0812) Last BM Date: 12/09/18 General: intubated Heart: regular rate and rythm Abdomen: soft, non-distended, normal bowel sounds   Lab Results: Recent Labs    12/10/18 1425 12/11/18 0239 12/12/18 2003  WBC 9.8 12.3*  --   HGB 13.7 13.0 10.2*  PLT 232 235  --   MCV 101.2* 99.2  --    Recent Labs    12/11/18 0239 12/12/18 0455 12/12/18 2003 12/13/18 0500  NA 133* 133* 135 134*  K 4.1 4.0 3.6 3.6  CL 97* 96*  --  105  CO2 24 22  --  21*  GLUCOSE 160* 130*  --  150*  BUN 13 17  --  31*  CREATININE 0.78 0.81  --  1.04  CALCIUM 9.0 9.4  --  8.0*   Recent Labs    12/11/18 0239 12/12/18 0455 12/13/18 0500  PROT 5.9* 6.4* 4.8*  ALBUMIN 3.0* 3.2* 2.3*  AST 66* 51* 32  ALT 129* 103* 60*  ALKPHOS 660* 613* 370*  BILITOT 1.7* 2.4* 1.5*   Studies/Results: Ct Head Wo Contrast  Result Date: 12/12/2018 CLINICAL DATA:  Follow-up examination status post craniotomy for subdural evacuation. EXAM: CT HEAD WITHOUT CONTRAST TECHNIQUE: Contiguous axial images were obtained from the base of the skull through the vertex without intravenous contrast. COMPARISON:  Prior CT from earlier the same day. FINDINGS: Brain: Postoperative changes from interval right frontoparietal and temporal craniotomy for evacuation of previously identified right subdural hematoma. A subdural J remains in place with tip overlying the right frontal convexity. Moderate postoperative pneumocephalus overlies the frontal convexities bilaterally, right  greater than left. Previously seen right subdural hematoma has been partially evacuated, now measuring up to approximately 13 mm in maximal diameter at the level of the right frontoparietal convexity. Extension along the falx and right tentorium again seen. There has been interval development of a left holo hemispheric subdural hematoma, measuring up to 9 mm in maximal diameter at the level of the left frontal convexity. Previously seen right-to-left midline shift is improved, with only trace 2-3 mm shift now seen. Scattered small volume subarachnoid hemorrhage seen within both cerebral hemispheres, most pronounced posteriorly. Small volume intraventricular hemorrhage seen layering within the occipital horns of both lateral ventricles, suspected to be related to redistribution. No hydrocephalus or ventricular trapping. Basilar cisterns remain patent. No acute large vessel territory infarct. Underlying atrophy with chronic microvascular ischemic disease again noted. Chronic encephalomalacia at the left parieto-occipital region noted as well. Vascular: No hyperdense vessel. Scattered vascular calcifications noted within the carotid siphons. Skull: Interval right craniotomy. Soft tissue swelling with emphysema seen within the overlying right scalp. Skin staples remain in place. Sinuses/Orbits: Globes and orbital soft tissues demonstrate no acute finding. Paranasal sinuses are largely clear. No mastoid effusion. Other: Endotracheal tube partially visualized. IMPRESSION: 1. Postoperative changes from interval right craniotomy for subdural evacuation. The right subdural has been partially evacuated, now measuring up to 13 mm in maximal thickness. A right subdural drain remains in place. 2. Interval increase in  size of left holo hemispheric subdural hematoma, now measuring up to 9 mm in maximal thickness. 3. Improved right-to-left midline shift, now measuring up to 2-3 mm. 4. Scattered small volume subarachnoid hemorrhage  within both cerebral hemispheres. Small volume intraventricular hemorrhage most likely related to redistribution. No hydrocephalus or ventricular trapping. Electronically Signed   By: Jeannine Boga M.D.   On: 12/12/2018 23:25   Ct Head Wo Contrast  Result Date: 12/12/2018 CLINICAL DATA:  Altered level of consciousness. Follow-up subdural hematoma. EXAM: CT HEAD WITHOUT CONTRAST TECHNIQUE: Contiguous axial images were obtained from the base of the skull through the vertex without intravenous contrast. COMPARISON:  12/10/2018 FINDINGS: There is intermittent moderate motion artifact despite repeat imaging. Brain: Subdural hematoma over the right cerebral convexity has mildly enlarged, now measuring up to 18 mm in thickness. Subdural blood is again noted to extend along the falx and tentorium. A smaller mixed density subdural hematoma over the left cerebral convexity has also mildly enlarged and now measures up to 5 mm in thickness. There is scattered small volume subarachnoid hemorrhage bilaterally which has increased, and there is a small amount of blood in the occipital horn of the left lateral ventricle which has mildly increased. Right cerebral hemispheric mass effect has slightly increased, however leftward midline shift has not significantly changed and remains 5 mm. No acute large territory infarct is identified. There is a chronic left parietal infarct. Mild cerebral atrophy is noted. Periventricular white matter hypodensities are similar to the prior study and nonspecific but compatible with mild chronic small vessel ischemic disease. Vascular: Calcified atherosclerosis at the skull base. Skull: No fracture or focal osseous lesion. Sinuses/Orbits: Visualized paranasal sinuses and mastoid air cells are clear. Bilateral cataract extraction is noted. Other: None. IMPRESSION: 1. Mildly increased size of right larger than left subdural hematomas. 2. Mildly increased small volume subarachnoid hemorrhage.  3. Unchanged 5 mm leftward midline shift. Electronically Signed   By: Logan Bores M.D.   On: 12/12/2018 08:53   Portable Chest X-ray  Result Date: 12/12/2018 CLINICAL DATA:  ETT placement radiograph. EXAM: PORTABLE CHEST 1 VIEW COMPARISON:  Chest CT 12/10/2018, chest radiograph 12/10/2018 FINDINGS: Low positioning of the endotracheal tube approximately 1.5 cm from the carina. Dual lead pacer pack overlies the left chest wall with stable positioning of leads in the right atrium and cardiac apex. Unchanged positioning of an aortic valve stent graft. Cardiomediastinal contours are unchanged from prior studies accounting for differences in imaging technique. Central vascular congestion without evidence of edema. Basilar areas of atelectasis. No consolidative opacity. No pneumothorax. No effusion. No acute osseous or soft tissue abnormality. Degenerative change present in the spine and shoulders. IMPRESSION: Low positioning of the endotracheal to approximately 1.5 cm from the carina. Recommend retraction 2-3 cm to the mid trachea. Hypoventilatory changes in lung bases. These results will be called to the ordering clinician or representative by the Radiologist Assistant, and communication documented in the PACS or zVision Dashboard. Electronically Signed   By: MD Lovena Le   On: 12/12/2018 20:52   Dg Abd Portable 1v  Result Date: 12/13/2018 CLINICAL DATA:  OG tube placement. EXAM: PORTABLE ABDOMEN - 1 VIEW COMPARISON:  CT abdomen 12/10/2018 FINDINGS: Side port of the NG tube is in the stomach. Bowel gas pattern is normal. Biliary stent is in place. Left basilar airspace disease is noted. IMPRESSION: Side port of the NG tube is in the stomach. Electronically Signed   By: San Morelle M.D.   On: 12/13/2018 07:41  Medications: Scheduled Meds: . amiodarone  200 mg Oral Daily  . chlorhexidine gluconate (MEDLINE KIT)  15 mL Mouth Rinse BID  . Chlorhexidine Gluconate Cloth  6 each Topical Daily  .  DULoxetine  40 mg Oral Daily  . ezetimibe  10 mg Oral Daily  . indomethacin  100 mg Rectal Once  . mouth rinse  15 mL Mouth Rinse 10 times per day  . polyethylene glycol  17 g Oral Daily  . tamsulosin  0.4 mg Oral Daily   Continuous Infusions: . clevidipine    . dexmedetomidine (PRECEDEX) IV infusion    . dextrose 5 % and 0.45% NaCl 50 mL/hr at 12/13/18 0928  . fentaNYL infusion INTRAVENOUS Stopped (12/13/18 0846)   PRN Meds:.famotidine, fentaNYL, hydrALAZINE, labetalol, ondansetron **OR** ondansetron (ZOFRAN) IV    Assessment/Plan: 85 y.o. male with complicated SDH, incidental noted elevated liver tests  For now, I am not planning on proceeding with the ERCP to evaluate, ? Replace the biliary stent that was placed in [place] many months ago. His TBili is a bit improved. If he stabilized from neurologic and resp perspective and his LFTs rise then would reconsider ERCP.  Will follow along.   Milus Banister, MD  12/13/2018, 9:34 AM [facility] Pager [phone]

## 2018-12-13 NOTE — Progress Notes (Signed)
PT Cancellation Note  Patient Details Name: Andrew Soto. MRN: [id number] DOB: [birth year]   Cancelled Treatment:    Reason Eval/Treat Not Completed: Patient not medically ready (remains intubated s/p procedure).  Ellamae Sia, PT, DPT Acute Rehabilitation Services Pager [phone] Office [phone]    Willy Eddy 12/13/2018, 8:01 AM

## 2018-12-13 NOTE — Procedures (Signed)
Extubation Procedure Note  Patient Details:   Name: Andrew Soto. DOB: 1932/04/18 MRN: [id number]   Airway Documentation:    Vent end date: 12/13/18 Vent end time: 1510   Evaluation  O2 sats: stable throughout Complications: No apparent complications Patient did tolerate procedure well. Bilateral Breath Sounds: Diminished, Clear   No   Pt was extubated per MD order. Positive cuff leak was noted. Pt did not speak post extubation, however unsure mental status and his ability to talk. Placed pt on 4 L [place]. Pt is stable at this time. RT will continue to monitor.   Ronaldo Miyamoto 12/13/2018, 3:17 PM

## 2018-12-14 ENCOUNTER — Inpatient Hospital Stay (HOSPITAL_COMMUNITY): Payer: Medicare Other

## 2018-12-14 DIAGNOSIS — Z7189 Other specified counseling: Secondary | ICD-10-CM

## 2018-12-14 DIAGNOSIS — I4891 Unspecified atrial fibrillation: Secondary | ICD-10-CM

## 2018-12-14 DIAGNOSIS — Z515 Encounter for palliative care: Secondary | ICD-10-CM

## 2018-12-14 MED ORDER — ACETAMINOPHEN 650 MG RE SUPP: 650.0000 mg | Freq: Four times a day (QID) | RECTAL | Status: DC | PRN

## 2018-12-14 MED ORDER — LEVETIRACETAM IN NACL 500 MG/100ML IV SOLN
500.0000 mg | Freq: Two times a day (BID) | INTRAVENOUS | Status: DC
  Administered 2018-12-14 – 2018-12-15 (×4): 500 mg via INTRAVENOUS
  Filled 2018-12-14 (×4): qty 100

## 2018-12-14 MED ORDER — GLYCOPYRROLATE 1 MG PO TABS
1.0000 mg | ORAL_TABLET | ORAL | Status: DC | PRN
  Filled 2018-12-14: qty 1

## 2018-12-14 MED ORDER — GLYCOPYRROLATE 0.2 MG/ML IJ SOLN: 0.2000 mg | INTRAMUSCULAR | Status: DC | PRN

## 2018-12-14 MED ORDER — ACETAMINOPHEN 325 MG PO TABS: 650.0000 mg | ORAL_TABLET | Freq: Four times a day (QID) | ORAL | Status: DC | PRN

## 2018-12-14 MED ORDER — DEXTROSE 5 % IV SOLN
INTRAVENOUS | Status: DC
  Administered 2018-12-14: 12:00:00 via INTRAVENOUS

## 2018-12-14 MED ORDER — POLYVINYL ALCOHOL 1.4 % OP SOLN
1.0000 [drp] | Freq: Four times a day (QID) | OPHTHALMIC | Status: DC | PRN
  Administered 2018-12-14: 1 [drp] via OPHTHALMIC
  Filled 2018-12-14: qty 15

## 2018-12-14 MED ORDER — DIPHENHYDRAMINE HCL 50 MG/ML IJ SOLN: 25.0000 mg | INTRAMUSCULAR | Status: DC | PRN

## 2018-12-14 MED ORDER — FENTANYL CITRATE (PF) 100 MCG/2ML IJ SOLN
25.0000 ug | INTRAMUSCULAR | Status: DC | PRN
  Administered 2018-12-14 (×2): 25 ug via INTRAVENOUS
  Filled 2018-12-14 (×2): qty 2

## 2018-12-14 MED ORDER — GLYCOPYRROLATE 0.2 MG/ML IJ SOLN
0.2000 mg | INTRAMUSCULAR | Status: DC | PRN
  Administered 2018-12-14: 20:00:00 0.2 mg via INTRAVENOUS
  Filled 2018-12-14: qty 1

## 2018-12-14 MED ORDER — LEVETIRACETAM IN NACL 1000 MG/100ML IV SOLN
1000.0000 mg | Freq: Once | INTRAVENOUS | Status: AC
  Administered 2018-12-14: 1000 mg via INTRAVENOUS
  Filled 2018-12-14: qty 100

## 2018-12-14 NOTE — Progress Notes (Signed)
Upon AM assessment, it was noted that pt was minimally responsive to pain and had a new right gaze preference. MD Emelda Brothers and MD Kipp Brood made aware and both assessed pt at bedside. Stat CT ordered. No other orders at this time.

## 2018-12-14 NOTE — Consult Note (Signed)
Consultation Note Date: 12/14/2018   Patient Name: Andrew IllJames M Bolen Jr.  DOB: 1932-05-08  MRN: [id number]  Age / Sex: 85 y.o., male  PCP: Carlis Stableummings, Charley Elizabeth, PA-C Referring Physician: Lynnell CatalanAgarwala, Ravi, MD  Reason for Consultation: Hospice Evaluation  HPI/Patient Profile: 85 y.o. male  with past medical history of aortic stenosis, a fib, CVA, HLD, colon cancer, HTN, and recurrent pancreatitis admitted on 12/10/2018 with traumatic SDH after a fall at home. Mental status declined after admission and patient was taken to OR for evacuation - required ventilator overnight. Extubated 7/8; however, mental status declined AM of 7/9. PMT was consulted for GOC.    Clinical Assessment and Goals of Care: Spoke with RN who shared Dr. Denese KillingsAgarwala was able to speak with son this morning and has transitioned patient to comfort care. She reports patient is comfortable with current regimen and no palliative care needs.   RN called back later to share that patient's son had called with questions and concerns about the "next steps" and was requesting a call back. RN confirms that patient remains comfortable - no PRNs have been needed.   Spoke with patient's son, Andrew Soto - he confirms wishes for full comfort care moving forward and we discussed measures being taken to provide comfort. He asks about options for where his father can receive care. We discussed residential hospice as an option and Andrew Soto was interested in this. We discussed the current visitation policy at the hospice facility. Discussed referral process. Andrew Soto agreed to this.   Message sent to CSW about family interest in [facility]. Comfort care orders reviewed. RN to administer robinul for secretions.   Primary Decision Maker NEXT OF KIN - son Andrew Soto  SUMMARY OF RECOMMENDATIONS   Full comfort care Family interested in transfer to [facility]  Code Status/Advance Care Planning:  DNR    Symptom Management:   PRN fentanyl  PRN robinul  Palliative Prophylaxis:   Aspiration, Delirium Protocol, Frequent Pain Assessment and Turn Reposition  Additional Recommendations (Limitations, Scope, Preferences):  Full Comfort Care  Psycho-social/Spiritual:   Desire for further Chaplaincy support:no  Additional Recommendations: Education on Hospice  Prognosis:   < 2 weeks  Discharge Planning: [facility]osp[facility] facility [facility]     Primary Diagnoses: Present on Admission: . Pancreatitis, recurrent . Hypertension goal BP (blood pressure) < 140/90 . Atrial fibrillation (HCC) . Depression . SDH (subdural hematoma) (HCC) . Gallstone of bile duct with obstruction   I have reviewed the medical record, interviewed the patient and family, and examined the patient. The following aspects are pertinent.  Past Medical History:  Diagnosis Date  . Actinic keratoses   . Aortic stenosis   . Atrial fibrillation (HCC)   . CVA (cerebral vascular accident) (HCC)   . High cholesterol   . History of colon cancer   . History of CVA (cerebrovascular accident)   . Hypertension   . Lower urinary tract symptoms (LUTS)   . Pancreatitis, recurrent    Social History   Socioeconomic History  . Marital status: Widowed    Spouse name: Not on file  . Number of children: 6  . Years of education: Not on file  . Highest education level: Not on file  Occupational History  . Not on file  Social Needs  . Financial resource strain: Not on file  . Food insecurity    Worry: Not on file    Inability: Not on file  . Transportation needs    Medical: Not  on file    Non-medical: Not on file  Tobacco Use  . Smoking status: Former Smoker    Packs/day: 1.00    Years: 5.00    Pack years: 5.00    Types: Cigarettes  . Smokeless tobacco: Never Used  Substance and Sexual Activity  . Alcohol use: Yes    Comment: glass wine daily  . Drug use: Never  . Sexual activity: Not  Currently    Birth control/protection: Abstinence  Lifestyle  . Physical activity    Days per week: Not on file    Minutes per session: Not on file  . Stress: Not on file  Relationships  . Social Herbalist on phone: Not on file    Gets together: Not on file    Attends religious service: Not on file    Active member of club or organization: Not on file    Attends meetings of clubs or organizations: Not on file    Relationship status: Not on file  Other Topics Concern  . Not on file  Social History Narrative  . Not on file   Family History  Problem Relation Age of Onset  . Testicular cancer Father    Scheduled Meds: . indomethacin  100 mg Rectal Once   Continuous Infusions: . levETIRAcetam 500 mg (12/14/18 1256)   PRN Meds:.diphenhydrAMINE, fentaNYL (SUBLIMAZE) injection, glycopyrrolate **OR** glycopyrrolate **OR** glycopyrrolate, ondansetron **OR** ondansetron (ZOFRAN) IV, polyvinyl alcohol Allergies  Allergen Reactions  . Epinephrine Other (See Comments)    Dizziness/double vision  . Penicillins Rash    Did it involve swelling of the face/tongue/throat, SOB, or low BP? Unknown Did it involve sudden or severe rash/hives, skin peeling, or any reaction on the inside of your mouth or nose? Unknown Did you need to seek medical attention at a hospital or doctor's office? Unknown When did it last happen?unk If all above answers are "NO", may proceed with cephalosporin use.   . Vicodin [Hydrocodone-Acetaminophen] Nausea Only and Other (See Comments)    Mood changes, Fatigue    Vital Signs: BP (!) 160/50   Pulse 66   Temp 97.9 F (36.6 C) (Axillary)   Resp (!) 22   Ht 5\' 6"  (1.676 m)   Wt 63.9 kg   SpO2 96%   BMI 22.74 kg/m  Pain Scale: CPOT POSS *See Group Information*: S-Acceptable,Sleep, easy to arouse Pain Score: 0-No pain   SpO2: SpO2: 96 % O2 Device:SpO2: 96 % O2 Flow Rate: .O2 Flow Rate (L/min): 14 L/min  IO: Intake/output summary:    Intake/Output Summary (Last 24 hours) at 12/14/2018 1613 Last data filed at 12/14/2018 1200 Gross per 24 hour  Intake 1117.7 ml  Output 665 ml  Net 452.7 ml    LBM: Last BM Date: 12/09/18 Baseline Weight: Weight: 72.6 kg Most recent weight: Weight: 63.9 kg     Palliative Assessment/Data: PPS 10%    The above conversation was completed via telephone due to the visitor restrictions during the COVID-19 pandemic. Thorough chart review and discussion with necessary members of the care team was completed as part of assessment. All issues were discussed and addressed but no physical exam was performed.  Time Total: 50 minutes Greater than 50%  of this time was spent counseling and coordinating care related to the above assessment and plan.  Juel Burrow, DNP, AGNP-C Palliative Medicine Team [phone] Pager: [phone]

## 2018-12-14 NOTE — Procedures (Signed)
History: 85 year old male with subdural hematoma  Sedation: None  Technique: This is a 21 channel routine scalp EEG performed at the bedside with bipolar and monopolar montages arranged in accordance to the international 10/20 system of electrode placement. One channel was dedicated to EKG recording.    Background: The background consists of moderate voltage irregular delta activity with some bifrontally predominant periodic discharges with triphasic morphology.  Slow activity is more prominent on the right than the left and there are 3 seizures lasting 1 to 2 minutes each rising from the right frontocentral region.  Photic stimulation: Physiologic driving is not performed  EEG Abnormalities: 1) 3 focal electrographic seizures without clear clinical correlate 2) focal right hemispheric slow activity 3) triphasic waves  4) generalized irregular slow activity  Clinical Interpretation: This EEG recorded 3 focal electrographic seizures arising from the right frontocentral region.  This was communicated to the primary team at the time of interpretation.  Roland Rack, MD [facility] [phone]  If 7pm- 7am, please page neurology on call as listed in [place].

## 2018-12-14 NOTE — Progress Notes (Signed)
Nutrition Brief Note  Chart reviewed. Pt now transitioning to comfort care.  No further nutrition interventions warranted at this time.  Please re-consult as needed.   Kenna Seward RD, LDN Clinical Nutrition Pager # - [phone]    

## 2018-12-14 NOTE — Progress Notes (Addendum)
Neurosurgery Service Progress Note  Subjective: Some right facial twitching overnight, keppra started  Objective: Vitals:   12/14/18 0600 12/14/18 0700 12/14/18 0800 12/14/18 0813  BP: (!) 148/50 (!) 145/52  (!) 161/51  Pulse: 66 64    Resp: (!) 23 (!) 23    Temp:   97.9 F (36.6 C)   TempSrc:   Axillary   SpO2: 100% 100%    Weight:      Height:       Temp (24hrs), Avg:97.5 F (36.4 C), Min:97.3 F (36.3 C), Max:97.9 F (36.6 C)  CBC Latest Ref Rng & Units 12/12/2018 12/11/2018 12/10/2018  WBC 4.0 - 10.5 K/uL - 12.3(H) 9.8  Hemoglobin 13.0 - 17.0 g/dL 10.2(L) 13.0 13.7  Hematocrit 39.0 - 52.0 % 30.0(L) 39.1 41.6  Platelets 150 - 400 K/uL - 235 232   BMP Latest Ref Rng & Units 12/13/2018 12/12/2018 12/12/2018  Glucose 70 - 99 mg/dL 161(W150(H) - 960(A130(H)  BUN 8 - 23 mg/dL 54(U31(H) - 17  Creatinine 0.61 - 1.24 mg/dL 9.811.04 - 1.910.81  BUN/Creat Ratio 6 - 22 (calc) - - -  Sodium 135 - 145 mmol/L 134(L) 135 133(L)  Potassium 3.5 - 5.1 mmol/L 3.6 3.6 4.0  Chloride 98 - 111 mmol/L 105 - 96(L)  CO2 22 - 32 mmol/L 21(L) - 22  Calcium 8.9 - 10.3 mg/dL 8.0(L) - 9.4    Intake/Output Summary (Last 24 hours) at 12/14/2018 0836 Last data filed at 12/14/2018 0800 Gross per 24 hour  Intake 1498.54 ml  Output 1090 ml  Net 408.54 ml    Current Facility-Administered Medications:  .  amiodarone (PACERONE) tablet 200 mg, 200 mg, Per Tube, Daily, Agarwala, Ravi, MD, 200 mg at 12/13/18 1003 .  Chlorhexidine Gluconate Cloth 2 % PADS 6 each, 6 each, Topical, Daily, Duayne CalHoffman, Paul W, NP, 6 each at 12/13/18 1743 .  clevidipine (CLEVIPREX) infusion 0.5 mg/mL, 0-21 mg/hr, Intravenous, Continuous, Oza Oberle A, MD .  dexmedetomidine (PRECEDEX) 400 MCG/100ML (4 mcg/mL) infusion, 0.4-1.2 mcg/kg/hr, Intravenous, Titrated, Lenetta Piche A, MD .  dextrose 5 %-0.45 % sodium chloride infusion, , Intravenous, Continuous, Tabita Corbo, Clovis Puhomas A, MD, Last Rate: 50 mL/hr at 12/14/18 0800 .  DULoxetine (CYMBALTA) DR capsule 40  mg, 40 mg, Oral, Daily, Jadene Pierinistergard, Pami Wool A, MD, 40 mg at 12/11/18 1631 .  ezetimibe (ZETIA) tablet 10 mg, 10 mg, Per Tube, Daily, Agarwala, Ravi, MD, 10 mg at 12/13/18 1004 .  famotidine (PEPCID) IVPB 20 mg premix, 20 mg, Intravenous, Q12H, Agarwala, Ravi, MD, Stopped at 12/13/18 2240 .  famotidine (PEPCID) tablet 20 mg, 20 mg, Oral, BID PRN, Jadene Pierinistergard, Elira Colasanti A, MD .  fentaNYL (SUBLIMAZE) bolus via infusion 25 mcg, 25 mcg, Intravenous, Q15 min PRN, Duayne CalHoffman, Paul W, NP, 25 mcg at 12/12/18 2101 .  fentaNYL 2500mcg in NS 250mL (6510mcg/ml) infusion-PREMIX, 25-200 mcg/hr, Intravenous, Continuous, Duayne CalHoffman, Paul W, NP, Stopped at 12/13/18 [phone]  hydrALAZINE (APRESOLINE) injection 10-20 mg, 10-20 mg, Intravenous, Q4H PRN, Barnett AbuElsner, Henry, MD, 10 mg at 12/14/18 0813 .  indomethacin (INDOCIN) 50 MG suppository 100 mg, 100 mg, Rectal, Once, Sury Wentworth A, MD .  labetalol (NORMODYNE) injection 10 mg, 10 mg, Intravenous, Q2H PRN, Jadene Pierinistergard, Annalisia Ingber A, MD, 10 mg at 12/13/18 2015 .  levETIRAcetam (KEPPRA) IVPB 500 mg/100 mL premix, 500 mg, Intravenous, Q12H, Tia AlertJones, David S, MD, Stopped at 12/14/18 0225 .  ondansetron (ZOFRAN) tablet 4 mg, 4 mg, Oral, Q6H PRN **OR** ondansetron (ZOFRAN) injection 4 mg, 4 mg, Intravenous, Q6H PRN, Lanetta Figuero,  Joyice Faster, MD .  polyethylene glycol (MIRALAX / GLYCOLAX) packet 17 g, 17 g, Per Tube, Daily, Agarwala, Ravi, MD .  tamsulosin (FLOMAX) capsule 0.4 mg, 0.4 mg, Oral, Daily, Levell Tavano, Joyice Faster, MD, 0.4 mg at 12/11/18 1631   Physical Exam: Eyes closed to stim, strong conjugate downgaze, PERRL, w/d BLE, min w/d in BUE Drain w/ serosang content  Assessment & Plan: 85 y.o. man s/p fall with large R SDH, on ASA81. 7/7 worsening AMS, CTH confirms expansion of R SDH, 7/7 s/p R craniotomy for SDH evacuation, post-op CTH with good evacuation, pneumocephalus, mild expansion of L SDH   -had an acute change right before rounds this morning, went from trying to get out of bed to  much more sedate with downgaze. I am concerned that he has expansion of his L SDH. If that is the case, I am going to recommend to the family comfort care measures only.  -continue drain -CCM consulted, appreciate recs -if [place] is negative, we should hook him up to EEG  Judith Part  12/14/18 8:36 AM

## 2018-12-14 NOTE — Progress Notes (Signed)
Called to bedside by RN to assess pt. Pt is currently sating 98% on 14L, 55% venti mask, however is not as responsive this AM as yesterday. MD wants no ABG or intubation at this time. RN informed.

## 2018-12-14 NOTE — Progress Notes (Signed)
EEG complete - results pending 

## 2018-12-14 NOTE — Progress Notes (Signed)
Palliative:  Spoke with RN who shared Dr. Lynetta Mare was able to speak with son this morning and has transitioned patient to comfort care. She reports patient is comfortable with current regimen and no palliative care needs.   Please let us know if we can be of assistance.  Thank you.  Juel Burrow, DNP, AGNP-C Palliative Medicine Team Team Phone # [phone]  Pager # [phone]  NO CHARGE

## 2018-12-14 NOTE — Progress Notes (Signed)
Pt. Had two prolonged periods of rhythmic twitching of face.  RN suspected it to be a seizure.  On call NSG was paged and gave orders for 500mg  IV Keppra BID.  Will administer and continue to observe.

## 2018-12-14 NOTE — Progress Notes (Signed)
Overall condition appreciated.  Palliative care approach seems very reasonable at this point.  THe bile duct stent is not in ideal position but his Tbili is not rising.  I recommend not attempting to manipulate, reposition the stent with ERCP at this point.  If he recovers further and if there is increasing concern for a biliary infection (rising LFTs, fever, etc) AND family agrees then could consider ERCP at that point.  For now I am signing off.   I am around all week and weekend for any questions or concerns.   Milus Banister, MD  12/14/2018, 7:34 AM [facility] Pager [phone]

## 2018-12-14 NOTE — Progress Notes (Signed)
Patient was NTS, due to ineffective cough, and unable to clear secretions. Patient has gurgling respirations at the door on my arrival. Patient SATs are borderline acceptable. Patient was NTS got back large to copious amounts of thick tanish/yellow secretions. Patient was then placed on Venti-mask for more O2 support. Patient is currently stable. RN aware.

## 2018-12-14 NOTE — Consult Note (Addendum)
NAME:  Andrew IllJames M Hollingshead Jr., MRN:  [id number], DOB:  Oct 27, 1931, LOS: 4 ADMISSION DATE:  12/10/2018, CONSULTATION DATE:  7/7 REFERRING MD:  Dr. Renford DillsAdhikari, CHIEF COMPLAINT:  SDH   Brief History   85 year old male admitted with SHD/SAH that was initially stable on 7/5. Mental status declined on 7/7 with progressive worsening on CT head. Taken to OR for evacuation and post-operatively was transferred to ICU on vent.  Presented after fall at home. Initially planned medical management. Incidental finding of elevated transaminases and was due to have possible ERCP to evaluate biliary duct dilatation.   Past Medical History   has a past medical history of Actinic keratoses, Aortic stenosis, Atrial fibrillation (HCC), CVA (cerebral vascular accident) (HCC), High cholesterol, History of colon cancer, History of CVA (cerebrovascular accident), Hypertension, Lower urinary tract symptoms (LUTS), and Pancreatitis, recurrent.  Significant Hospital Events   7/5 admit for subdural hematoma 7/7 to OR for evacuation, to ICU on vent postop.  Consults:  Neurosurgery PCCM  Procedures:  7/7 right craniotomy for evacuation of subdural hematoma 7/7 ET tube  Significant Diagnostic Tests:  7/5 CT head, C-spine > large subdural hematoma with associated 5 mm right to left midline shift.  Subarachnoid hemorrhage with focus in the right parietal cortex.  Small left subdural hematoma.  Severe multilevel degenerative disc disease in the cervical spine. 7/5 CT abdomen pelvis > stent within the distal common duct.  Intra-and extrahepatic biliary duct dilation about the stent.  Infrarenal abdominal aortic ectasia. 7/5 abdominal ultrasound RUQ> intra-and intrahepatic biliary ductal dilation. 7/7 CT head> mildly increased size of right larger than left subdural hematomas.  Mildly increased small volume subarachnoid hemorrhage.  Unchanged 5 mm leftward midline shift.  Micro Data:  N/A  Antimicrobials:  Perioperative  Bactrim and cefazolin 7/7  Interim history/subjective:  Extubated yesterday.  Declining mental status this morning with facial twitching overnight.  For repeat CT this morning.  Objective   Blood pressure (!) 161/51, pulse 64, temperature 97.9 F (36.6 C), temperature source Axillary, resp. rate (!) 23, height 5\' 6"  (1.676 m), weight 63.9 kg, SpO2 100 %.    Vent Mode: PSV;CPAP FiO2 (%):  [40 %-55 %] 55 % Set Rate:  [18 bmp] 18 bmp Vt Set:  [510 mL] 510 mL PEEP:  [5 cmH20] 5 cmH20 Pressure Support:  [10 cmH20] 10 cmH20 Plateau Pressure:  [15 cmH20] 15 cmH20   Intake/Output Summary (Last 24 hours) at 12/14/2018 0841 Last data filed at 12/14/2018 0800 Gross per 24 hour  Intake 1498.54 ml  Output 1090 ml  Net 408.54 ml   Filed Weights   12/10/18 1325 12/10/18 2357 12/12/18 0704  Weight: 72.6 kg 63.9 kg 63.9 kg    Examination: General: Elderly male on vent. Moderate cachexia.  HENT: Surgical incision over right scalp closed with staples.  Minimal drainage from JP.  Lungs: Clear bilateral breath sounds. Snoring on occasion. Cardiovascular: RRR, no MRG. Extremities warm.  Abdomen: Soft, nontender, nondistended Extremities: No acute deformity Neuro: No response to painful stimuli. Marked downward gaze bilaterally (Parinaud's sign).    Resolved Hospital Problem list   Acute respiratory failure  Assessment & Plan:    Subdural hematoma: Large right-sided subdural hematoma, small left-sided subdural hematoma.  5 mm right to left midline shift.  Status post craniotomy and evacuation on 7/7 Subarachnoid hemorrhage: Focus over the right parietal Clinical worsening this morning with examination consistent with severe bi-hemispheric dysfunction. -CT head to evaluate for expansion of L SDH -No further  intervention planned as poor neurological response to first evacuation. No further intervention planned. -Keep systolic blood pressure less than 160 -PRN hydralazine, labetalol  Elevated  LFT: With history of recurrent hepatitis.  Remote biliary stent remains in place.  Was scheduled for ERCP, however, neurologic developments have delayed this. -Management per GI -N.p.o.  Atrial fibrillation: Currently in sinus. No anticoagulation at bedside.  - Telemetry monitoring  - Continue home amiodarone once taking PO  Poor prognosis: palliative care has been previously consulted by [facility].  Ongoing discussions with family.  Best practice:  Diet: NPO Pain/Anxiety/Delirium protocol (if indicated): none VAP protocol (if indicated): Yes DVT prophylaxis: No GI prophylaxis: Pepcid Glucose control: Na Mobility: BR Code Status: FULL Family Communication: updated by Dr Zada Finders. Disposition: ICU  Labs   CBC: Recent Labs  Lab 12/10/18 1425 12/11/18 0239 12/12/18 2003  WBC 9.8 12.3*  --   NEUTROABS 8.6*  --   --   HGB 13.7 13.0 10.2*  HCT 41.6 39.1 30.0*  MCV 101.2* 99.2  --   PLT 232 235  --     Basic Metabolic Panel: Recent Labs  Lab 12/10/18 1425 12/11/18 0239 12/12/18 0455 12/12/18 2003 12/13/18 0500  NA 132* 133* 133* 135 134*  K 4.2 4.1 4.0 3.6 3.6  CL 100 97* 96*  --  105  CO2 23 24 22   --  21*  GLUCOSE 143* 160* 130*  --  150*  BUN 14 13 17   --  31*  CREATININE 0.70 0.78 0.81  --  1.04  CALCIUM 9.0 9.0 9.4  --  8.0*   GFR: Estimated Creatinine Clearance: 46.9 mL/min (by C-G formula based on SCr of 1.04 mg/dL). Recent Labs  Lab 12/10/18 1425 12/11/18 0239  WBC 9.8 12.3*    Liver Function Tests: Recent Labs  Lab 12/10/18 1425 12/11/18 0239 12/12/18 0455 12/13/18 0500  AST 98* 66* 51* 32  ALT 154* 129* 103* 60*  ALKPHOS 701* 660* 613* 370*  BILITOT 2.4* 1.7* 2.4* 1.5*  PROT 5.8* 5.9* 6.4* 4.8*  ALBUMIN 3.1* 3.0* 3.2* 2.3*   No results for input(s): LIPASE, AMYLASE in the last 168 hours. No results for input(s): AMMONIA in the last 168 hours.  ABG    Component Value Date/Time   PHART 7.396 12/12/2018 2003   PCO2ART 35.0 12/12/2018 2003    PO2ART 224.0 (H) 12/12/2018 2003   HCO3 21.6 12/12/2018 2003   TCO2 23 12/12/2018 2003   ACIDBASEDEF 3.0 (H) 12/12/2018 2003   O2SAT 100.0 12/12/2018 2003     Coagulation Profile: No results for input(s): INR, PROTIME in the last 168 hours.  Cardiac Enzymes: No results for input(s): CKTOTAL, CKMB, CKMBINDEX, TROPONINI in the last 168 hours.  HbA1C: No results found for: HGBA1C  CBG: Recent Labs  Lab 12/10/18 [place]   Kipp Brood, MD [facility] ICU Physician [place]  Pager: [phone] Mobile: [phone] After hours: [phone].   12/14/2018 8:41 AM   ADDENDUM:  CT scan shows possible progression of left subdural hematoma.  Discussed with Dr. Zada Finders: Concern is for seizure. Patient has not made substantive neurological recovery. Discussed patient's condition with his son.  Have indicated to him that his father is not a candidate for further intervention and that seizure and poor neurological recovery portend poor long-term prognosis. Son stated that his father was already in declining health and unsatisfied with his quality of life.  His father had also stated that he did not want to be sustained  by artificial means. Was agreed that initial surgery was already at the limits of what his father would have deemed acceptable terms of aggressiveness of care. Given these facts it was decided to make the patient comfort care.  Arrangements will be made for the family to visit.  Lynnell Catalanavi Alexandru Moorer, MD [facility] ICU Physician [facility]  Pager: [phone] Mobile: [phone] After hours: [phone].  12/14/2018, 11:08 AM

## 2018-12-15 MED ORDER — LORAZEPAM 2 MG/ML IJ SOLN
2.0000 mg | INTRAMUSCULAR | Status: DC | PRN
  Administered 2018-12-15: 2 mg via INTRAVENOUS
  Filled 2018-12-15: qty 1

## 2018-12-15 MED ORDER — GLYCOPYRROLATE 0.2 MG/ML IJ SOLN: 0.2000 mg | INTRAMUSCULAR | 30 refills | Status: AC | PRN

## 2018-12-15 MED ORDER — LORAZEPAM 2 MG/ML IJ SOLN: 2.0000 mg | INTRAMUSCULAR | 0 refills | Status: AC | PRN

## 2018-12-15 MED ORDER — LEVETIRACETAM IN NACL 500 MG/100ML IV SOLN: 500.0000 mg | Freq: Two times a day (BID) | INTRAVENOUS | 30 refills | Status: AC

## 2018-12-15 MED ORDER — DIPHENHYDRAMINE HCL 50 MG/ML IJ SOLN: 25.0000 mg | INTRAMUSCULAR | 30 refills | Status: AC | PRN

## 2018-12-15 MED ORDER — ONDANSETRON HCL 4 MG/2ML IJ SOLN: 4.0000 mg | Freq: Four times a day (QID) | INTRAMUSCULAR | 0 refills | Status: AC | PRN

## 2018-12-15 MED ORDER — POLYVINYL ALCOHOL 1.4 % OP SOLN: 1.0000 [drp] | Freq: Four times a day (QID) | OPHTHALMIC | 0 refills | Status: AC | PRN

## 2018-12-15 MED ORDER — FENTANYL CITRATE (PF) 100 MCG/2ML IJ SOLN: 25.0000 ug | INTRAMUSCULAR | 0 refills | Status: AC | PRN

## 2018-12-15 NOTE — Progress Notes (Signed)
Daily Progress Note   Patient Name: Andrew IllJames M Gaetano Jr.       Date: 12/15/2018 DOB: 1932/03/02  Age: 85 y.o. MRN#: [id number] Attending Physician: Lynnell CatalanAgarwala, Ravi, MD Primary Care Physician: Riki Ruskummings, Charley Elizabeth, PA-C Admit Date: 12/10/2018  Reason for Consultation/Follow-up: Hospice Evaluation, Inpatient hospice referral, Non pain symptom management, Pain control, Psychosocial/spiritual support and Terminal Care  Subjective: Spoke with RN - some seizure activity this AM,  Ativan was administered. Resting well since. No s/s pain, no labored breathing - appears very comfortable.  Length of Stay: 5  Current Medications: Scheduled Meds:    Continuous Infusions: . levETIRAcetam 500 mg (12/15/18 0107)    PRN Meds: diphenhydrAMINE, fentaNYL (SUBLIMAZE) injection, glycopyrrolate **OR** glycopyrrolate **OR** glycopyrrolate, LORazepam, ondansetron **OR** ondansetron (ZOFRAN) IV, polyvinyl alcohol        Vital Signs: BP (!) 119/58   Pulse 88   Temp 97.9 F (36.6 C) (Axillary)   Resp (!) 22   Ht 5\' 6"  (1.676 m)   Wt 63.9 kg   SpO2 96%   BMI 22.74 kg/m  SpO2: SpO2: 96 % O2 Device: O2 Device: Nasal Cannula O2 Flow Rate: O2 Flow Rate (L/min): 2 L/min  Intake/output summary:   Intake/Output Summary (Last 24 hours) at 12/15/2018 1258 Last data filed at 12/15/2018 0107 Gross per 24 hour  Intake 200 ml  Output 140 ml  Net 60 ml   LBM: Last BM Date: 12/09/18 Baseline Weight: Weight: 72.6 kg Most recent weight: Weight: 63.9 kg       Palliative Assessment/Data: PPS 10%    Flowsheet Rows     Most Recent Value  Intake Tab  Referral Department  -- [nsgy]  Unit at Time of Referral  ICU  Palliative Care Primary Diagnosis  Neurology  Date Notified  12/12/18  Palliative Care Type  New  Palliative care  Reason for referral  Counsel Regarding Hospice, Clarify Goals of Care  Date of Admission  12/10/18  Date first seen by Palliative Care  12/13/18  # of days Palliative referral response time  1 Day(s)  # of days IP prior to Palliative referral  2  Clinical Assessment  Palliative Performance Scale Score  10%  Psychosocial & Spiritual Assessment  Palliative Care Outcomes  Patient/Family meeting held?  Yes  Who was at the meeting?  son  Palliative Care Outcomes  Counseled regarding hospice, Provided end of life care assistance, Provided psychosocial or spiritual support, Transitioned to hospice      Patient Active Problem List   Diagnosis Date Noted  . Goals of care, counseling/discussion   . Palliative care by specialist   . Comfort measures only status   . Acute respiratory insufficiency   . Transaminitis   . SDH (subdural hematoma) (HCC) 12/10/2018  . Gallstone of bile duct with obstruction 12/10/2018  . Pressure ulcer of right buttock, stage 2 (HCC) 09/04/2018  . Multiple falls 09/04/2018  . Weakness of both lower extremities 09/04/2018  . Acquired left foot drop 09/04/2018  . Uses walker 09/04/2018  . Dyspepsia 09/04/2018  . Therapeutic opioid induced constipation 07/12/2018  . Nausea without vomiting 07/12/2018  . History of syncope 07/12/2018  . Depression 07/12/2018  . Encounter for long-term (  current) use of medications 07/12/2018  . Drug-induced constipation 06/30/2018  . Lumbar spinal stenosis 06/23/2018  . Aortic atherosclerosis ([place]) 05/26/2018  . Muscle weakness of lower extremity 04/23/2018  . Gait disturbance, post-stroke 04/23/2018  . History of fall 04/23/2018  . Hypertension goal BP (blood pressure) < 140/90 04/23/2018  . Pacemaker 04/23/2018  . Atrial fibrillation ([place]) 04/23/2018  . Lower urinary tract symptoms (LUTS)   . Pancreatitis, recurrent   . History of CVA (cerebrovascular accident)   . History of colon cancer     Palliative  Care Assessment & Plan   HPI: 85 y.o. male  with past medical history of aortic stenosis, a fib, CVA, HLD, colon cancer, HTN, and recurrent pancreatitis admitted on 12/10/2018 with traumatic SDH after a fall at home. Mental status declined after admission and patient was taken to OR for evacuation - required ventilator overnight. Extubated 7/8; however, mental status declined AM of 7/9. PMT was consulted for [place].    Assessment: Spoke with both social work and hospice liaison this AM to discuss family interest in [facility] - all are aware.   Discussed with RN - co concerns, patient comfortable. Reviewed PRNs administered.   Family coming to visit later today.   Recommendations/Plan:  Continue full comfort care  Hopeful for [facility] place today  Goals of Care and Additional Recommendations:  Limitations on Scope of Treatment: Full Comfort Care  Code Status:  DNR  Prognosis:   < 2 weeks  Discharge Planning:  [place] was discussed with RN, social work, hospice liaison  Thank you for allowing the Palliative Medicine Team to assist in the care of this patient.   Total Time 15 minutes Prolonged Time Billed  no     The above conversation was completed via telephone due to the visitor restrictions during the COVID-19 pandemic. Thorough chart review and discussion with necessary members of the care team was completed as part of assessment. All issues were discussed and addressed but no physical exam was performed.     Greater than 50%  of this time was spent counseling and coordinating care related to the above assessment and plan.  Juel Burrow, DNP, [facility] Palliative Medicine Team Team Phone # [phone]  Pager [phone]

## 2018-12-15 NOTE — TOC Transition Note (Signed)
Transition of Care [facility]) - CM/SW Discharge Note   Patient Details  Name: Brennon Otterness. MRN: [id number] Date of Birth: [birth year]  Transition of Care [facility]) CM/SW Contact:  Weston Anna, LCSW Phone Number: 12/15/2018, 1:28 PM   Clinical Narrative:     Patient set to discharge to [facility] for today 7/10.CSW faxed discharge summary to [phone]. RN please call report to [phone]. CSW waiting notification from facility to call transportation.    Final next level of care: [place] Barriers to Discharge: No Barriers Identified   Patient Goals and CMS Choice        Discharge Placement                       Discharge Plan and Services                                     Social Determinants of Health (SDOH) Interventions     Readmission Risk Interventions No flowsheet data found.

## 2018-12-15 NOTE — Progress Notes (Signed)
[place] d/c'd, pt comfort care, pending transfer to hospice.

## 2018-12-15 NOTE — Progress Notes (Signed)
Manufacturing engineer [facility]) Hospital Liaison note.    Received request from [place] for family interest in [facility] with request for transfer  today. Chart reviewed and eligibility confirmed. Spoke with family to confirm interest and explain services. Family agreeable to transfer today. CSW aware.  Registration paper work completed. Dr. Orpah Melter to assume care per family request.   Please fax discharge summary to [phone]. RN please call report to [phone]. Please arrange transport for patient.    Thank you,      Farrel Gordon, RN, [facility]   [place]     [place] are on AMION

## 2018-12-15 NOTE — Discharge Summary (Signed)
Physician Discharge Summary  Patient ID: Andrew IllJames M Colburn Jr. MRN: [id number] DOB/AGE: 02-13-1932 85 y.o.  Admit date: 12/10/2018 Discharge date: 12/15/2018  Admission Diagnoses:  Discharge Diagnoses:  Principal Problem:   SDH (subdural hematoma) (HCC) Active Problems:   Pancreatitis, recurrent   History of CVA (cerebrovascular accident)   Hypertension goal BP (blood pressure) < 140/90   Atrial fibrillation (HCC)   Depression   Multiple falls   Gallstone of bile duct with obstruction   Acute respiratory insufficiency   Transaminitis   Goals of care, counseling/discussion   Palliative care by specialist   Comfort measures only status   Discharged Condition: poor  Hospital Course: 85 year old man with increasing frailty with frequent falls. Suffered a further fall on 7/5 and while initially confused, his mental status cleared and [facility]. He was found to have bilateral acute SDH's.  He unfortunately became obtunded and hemiplegic and underwent emergent evacuation of an expanding SDH. He was agitated post-operatively and passed an SBT and was successfully extubated. Unfortunately, by the next morning the patient was obtunded and was found to have intermittent facial twitching. CT showed mild expansion of the left SDH. Given his age, Neurosurgery did not deem him an acceptable candidate for a further craniotomy. EEG did show seizures but loading with anticonvulsants did not improve his mental status. We discussed the patients condition with his son who informed us that his father had been in declining health and did not wish to be kept alive by extraordinary means. Given that the best case outcome would be a return to further declining health likely institutionalized and dependent on others, the decision was made to transition the patient to comfort care.  The Palliative care service made arrangements [facility]  Consults: pulmonary/intensive care and Neurosurgery  Significant Diagnostic Studies: radiology: MRI: Showing bilateral subdural hematomas  Treatments: surgery: craniectomy with hematoma evacuation.  Discharge Exam: Blood pressure (!) 119/58, pulse 88, temperature 97.9 F (36.6 C), temperature source Axillary, resp. rate (!) 22, height 5\' 6"  (1.676 m), weight 63.9 kg, SpO2 96 %. General appearance: cachectic Head: Well healed right craniectomy incision Eyes: Bilateral forced downward gaze Resp: clear to auscultation bilaterally and Mild tachypnea Cardio: regular rate and rhythm, S1, S2 normal, no murmur, click, rub or gallop Neurologic: Mental status: alertness: comatose Motor: no motor response to painful stimuli  Disposition:  Transfer to hospice. DNR/DNI - Transport form complete.   Allergies as of 12/15/2018      Reactions   Epinephrine Other (See Comments)   Dizziness/double vision   Penicillins Rash   Did it involve swelling of the face/tongue/throat, SOB, or low BP? Unknown Did it involve sudden or severe rash/hives, skin peeling, or any reaction on the inside of your mouth or nose? Unknown Did you need to seek medical attention at a hospital or doctor's office? Unknown When did it last happen?unk If all above answers are "NO", may proceed with cephalosporin use.   Vicodin [hydrocodone-acetaminophen] Nausea Only, Other (See Comments)   Mood changes, Fatigue      Medication List    STOP taking these medications   acetaminophen 650 MG CR tablet Commonly known as: TYLENOL   AMBULATORY NON FORMULARY MEDICATION   amiodarone 200 MG tablet Commonly known as: PACERONE   aspirin 81 MG tablet   bisacodyl 5 MG EC tablet Commonly known as: DULCOLAX   [employer] VITAMIN B12 1000 MCG tablet  Generic drug: cyanocobalamin   DULoxetine 20 MG capsule Commonly known as: Cymbalta   ezetimibe 10 MG tablet Commonly known as: ZETIA   famotidine 20 MG  tablet Commonly known as: Pepcid   NONFORMULARY OR COMPOUNDED ITEM   NONFORMULARY OR COMPOUNDED ITEM   tamsulosin 0.4 MG Caps capsule Commonly known as: FLOMAX   valsartan 80 MG tablet Commonly known as: DIOVAN   VITAMIN D3 PO     TAKE these medications   diphenhydrAMINE 50 MG/ML injection Commonly known as: BENADRYL Inject 0.5 mLs (25 mg total) into the vein every 4 (four) hours as needed for itching.   fentaNYL 100 MCG/2ML injection Commonly known as: SUBLIMAZE Inject 0.5 mLs (25 mcg total) into the vein every hour as needed for moderate pain or severe pain.   glycopyrrolate 0.2 MG/ML injection Commonly known as: ROBINUL Inject 1 mL (0.2 mg total) into the skin every 4 (four) hours as needed (excessive secretions).   glycopyrrolate 0.2 MG/ML injection Commonly known as: ROBINUL Inject 1 mL (0.2 mg total) into the vein every 4 (four) hours as needed (excessive secretions).   levETIRAcetam 500 MG/100ML Soln Commonly known as: KEPRRA Inject 100 mLs (500 mg total) into the vein every 12 (twelve) hours.   LORazepam 2 MG/ML injection Commonly known as: ATIVAN Inject 1 mL (2 mg total) into the vein every hour as needed for seizure.   ondansetron 4 MG/2ML Soln injection Commonly known as: ZOFRAN Inject 2 mLs (4 mg total) into the vein every 6 (six) hours as needed for nausea.   polyvinyl alcohol 1.4 % ophthalmic solution Commonly known as: LIQUIFILM TEARS Place 1 drop into both eyes 4 (four) times daily as needed for dry eyes.        SignedKipp Brood 12/15/2018, 7:58 AM

## 2018-12-15 NOTE — Progress Notes (Signed)
Palliative Medicine RN Note: Consult order placed late yesterday for residential hospice; their choice is BP following discussion w family. No note yet that SW has gotten a chance to notify [facility], so I reached out to their liaison Venia Carbon to confirm that they are aware of referral and reaching out to the family.  Marjie Skiff Massiel Stipp, RN, BSN, [facility] Palliative Medicine Team 12/15/2018 9:59 AM Office [phone]

## 2018-12-17 ENCOUNTER — Encounter: Payer: Self-pay | Admitting: Physician Assistant

## 2019-01-06 DEATH — deceased

## 2019-02-06 DEATH — deceased

## 2020-09-27 IMAGING — DX DG CHEST 2V
2 series · 2 of 2 positions shown · non-contrast
Comparison: Radiographs March 27, 2018.

CLINICAL DATA: Shortness of breath, chest pain.

EXAM:
CHEST - 2 VIEW

[chest pa]
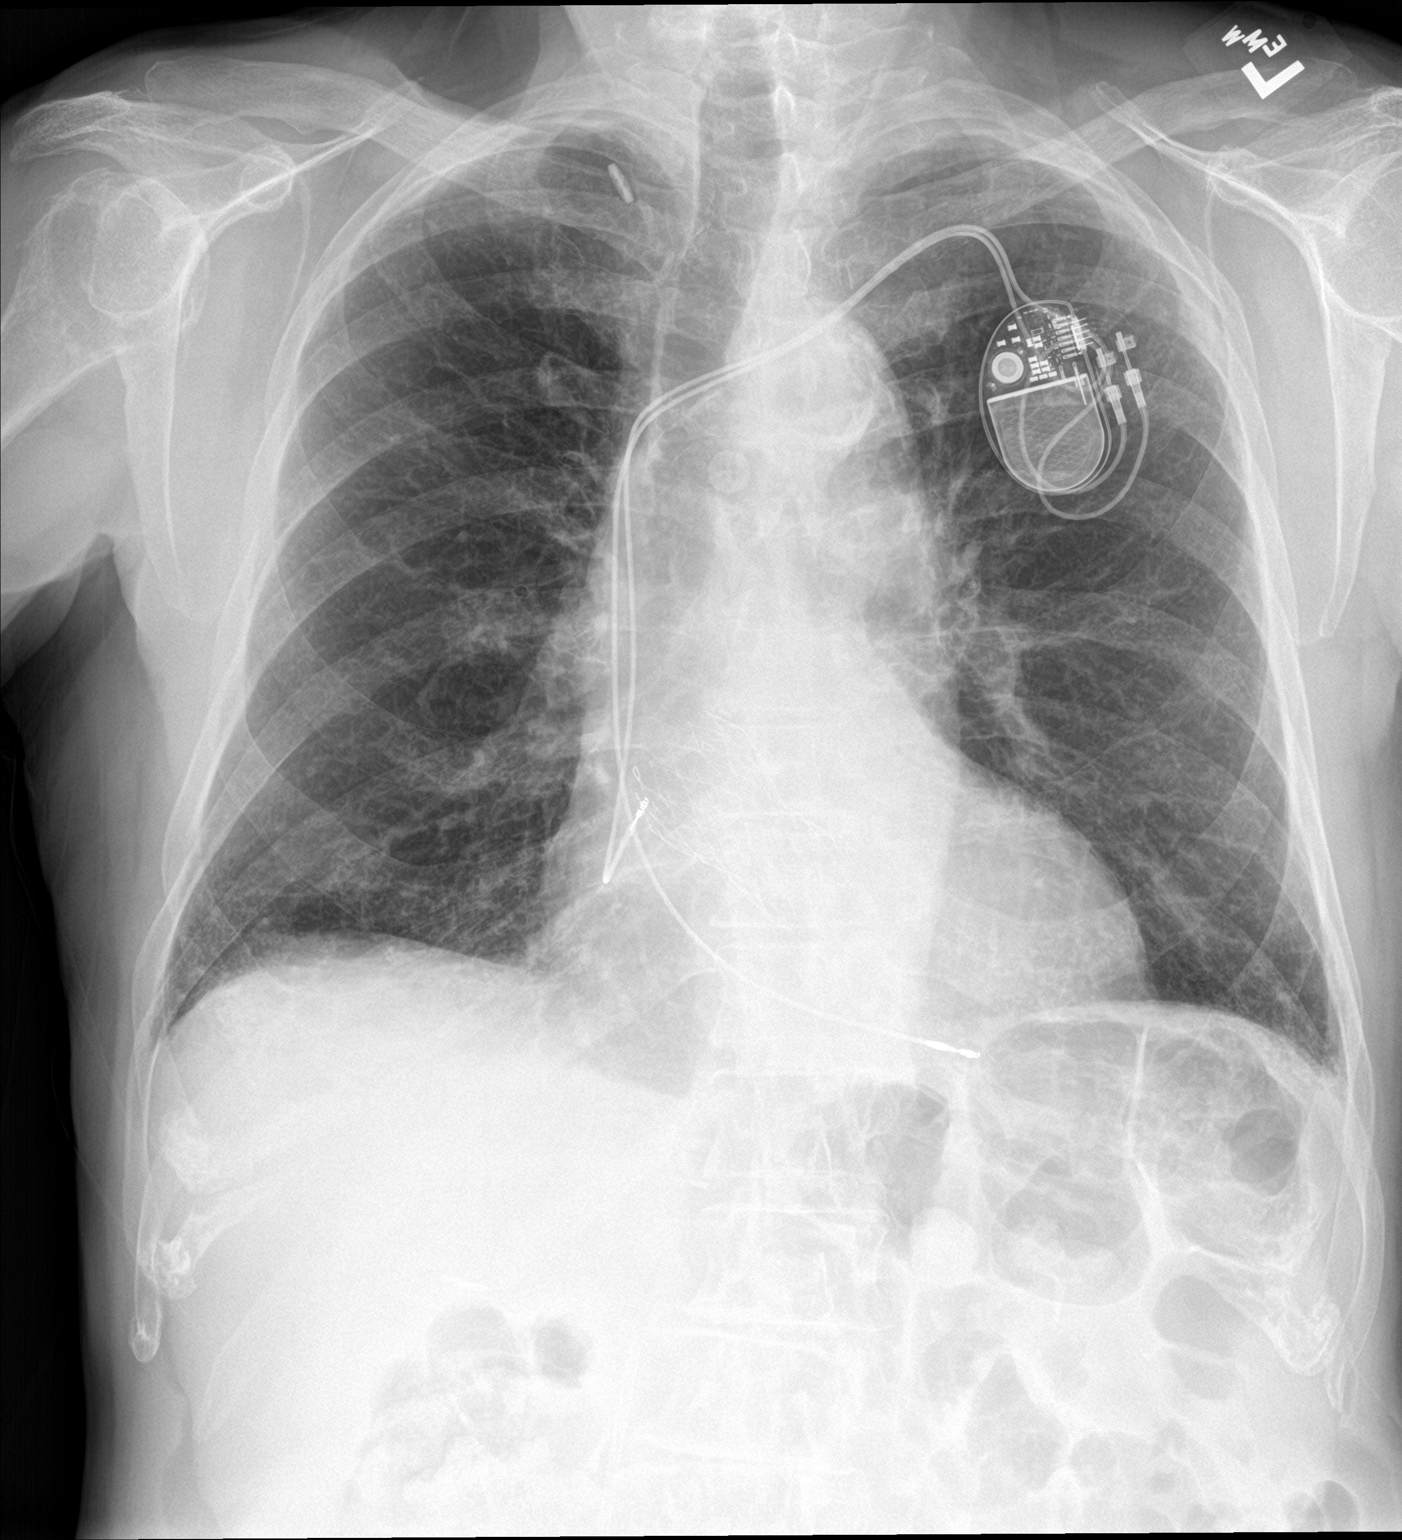

[chest lat]
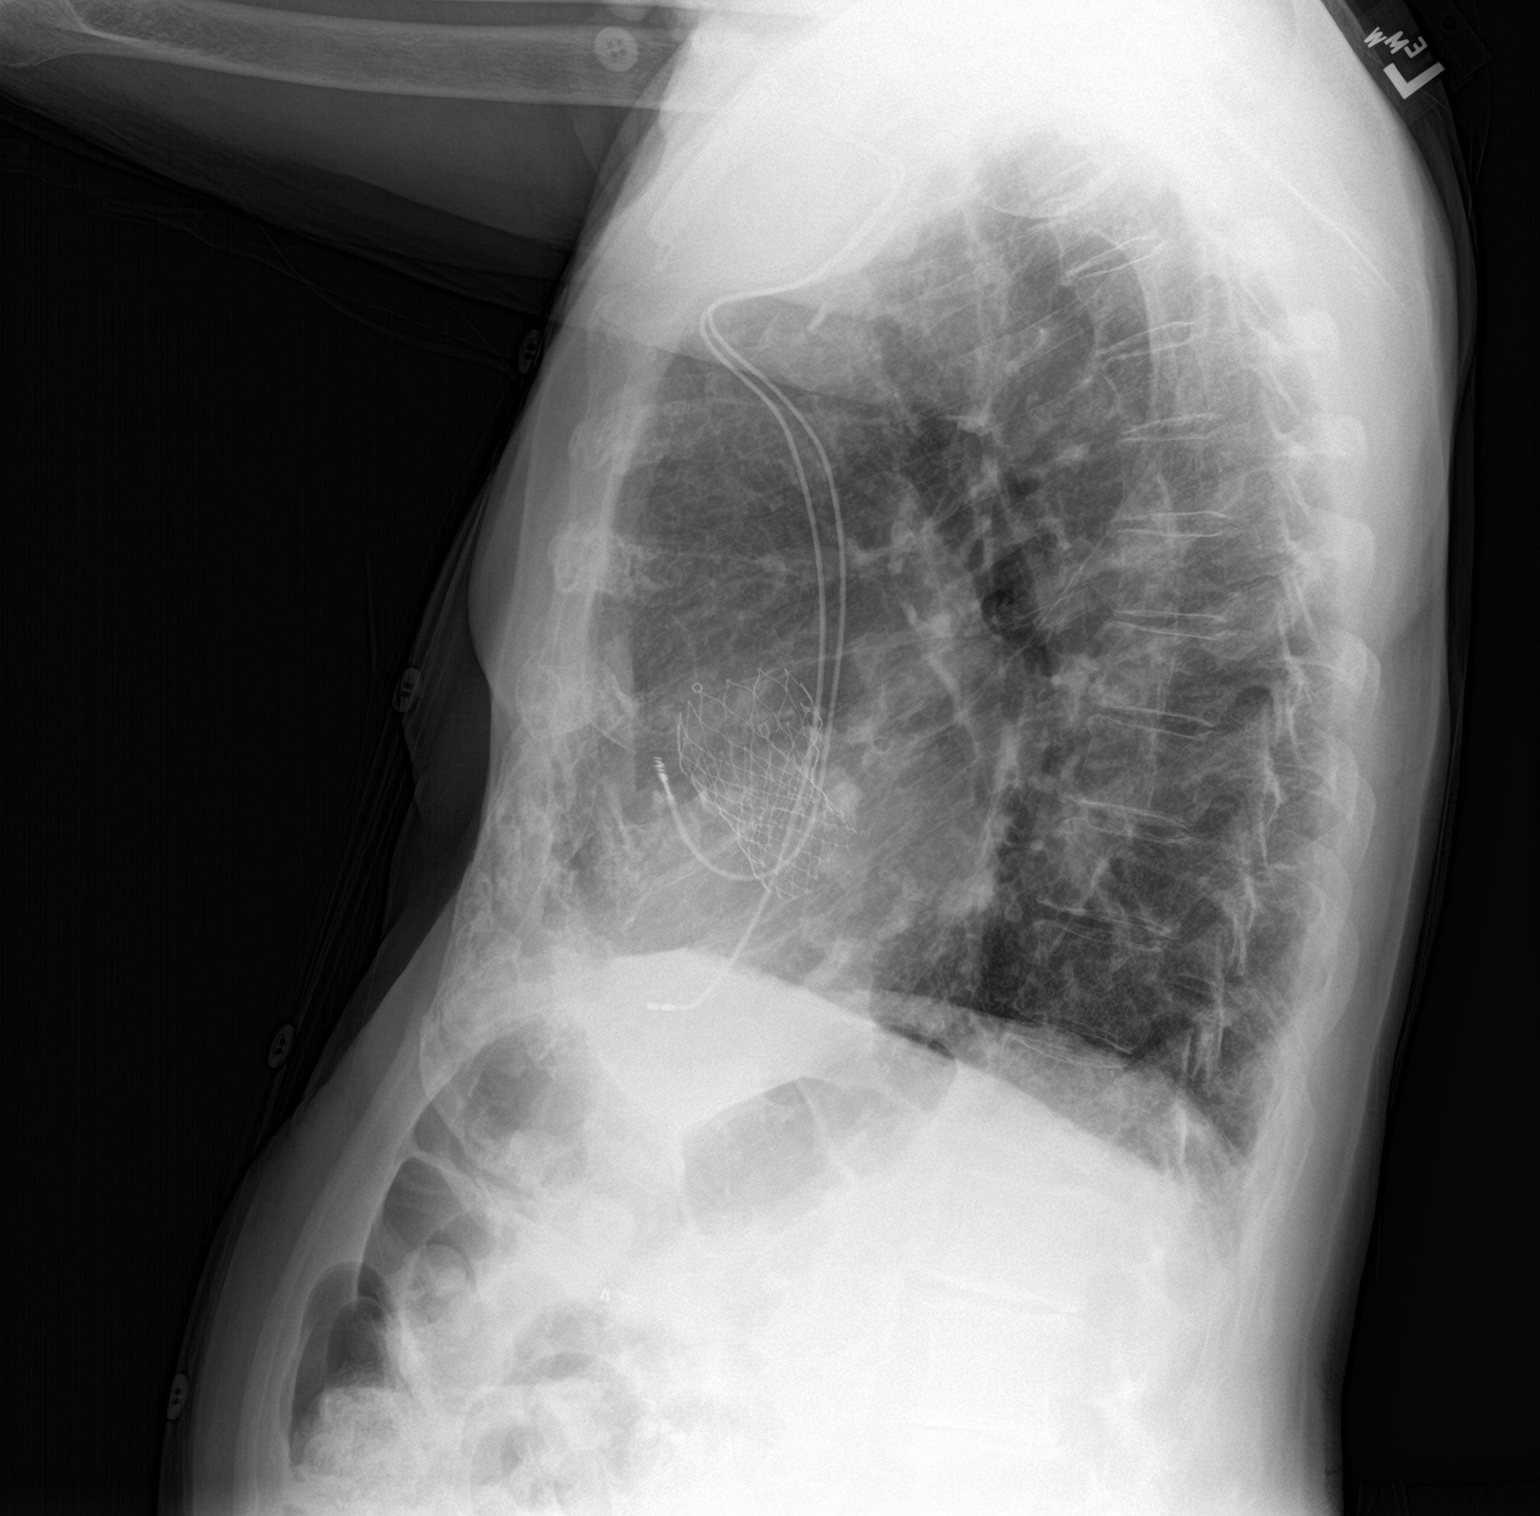

[2 of 2 positions shown; findings below may reference images not displayed]

FINDINGS: Stable cardiomediastinal silhouette. Left-sided pacemaker is
unchanged in position. Atherosclerosis of thoracic aorta is noted.
No pneumothorax or pleural effusion is noted. Both lungs are clear.
The visualized skeletal structures are unremarkable.
IMPRESSION: No active cardiopulmonary disease.

Aortic Atherosclerosis ([id number].H).

## 2020-11-22 IMAGING — DX DG KNEE COMPLETE 4+V*R*
4 series · 4 of 4 positions shown · non-contrast
Comparison: None.

CLINICAL DATA: Chronic bilateral knee pain for several years. The
patient was unable to stand.

EXAM:
RIGHT KNEE - COMPLETE 4+ VIEW

[knee ap]
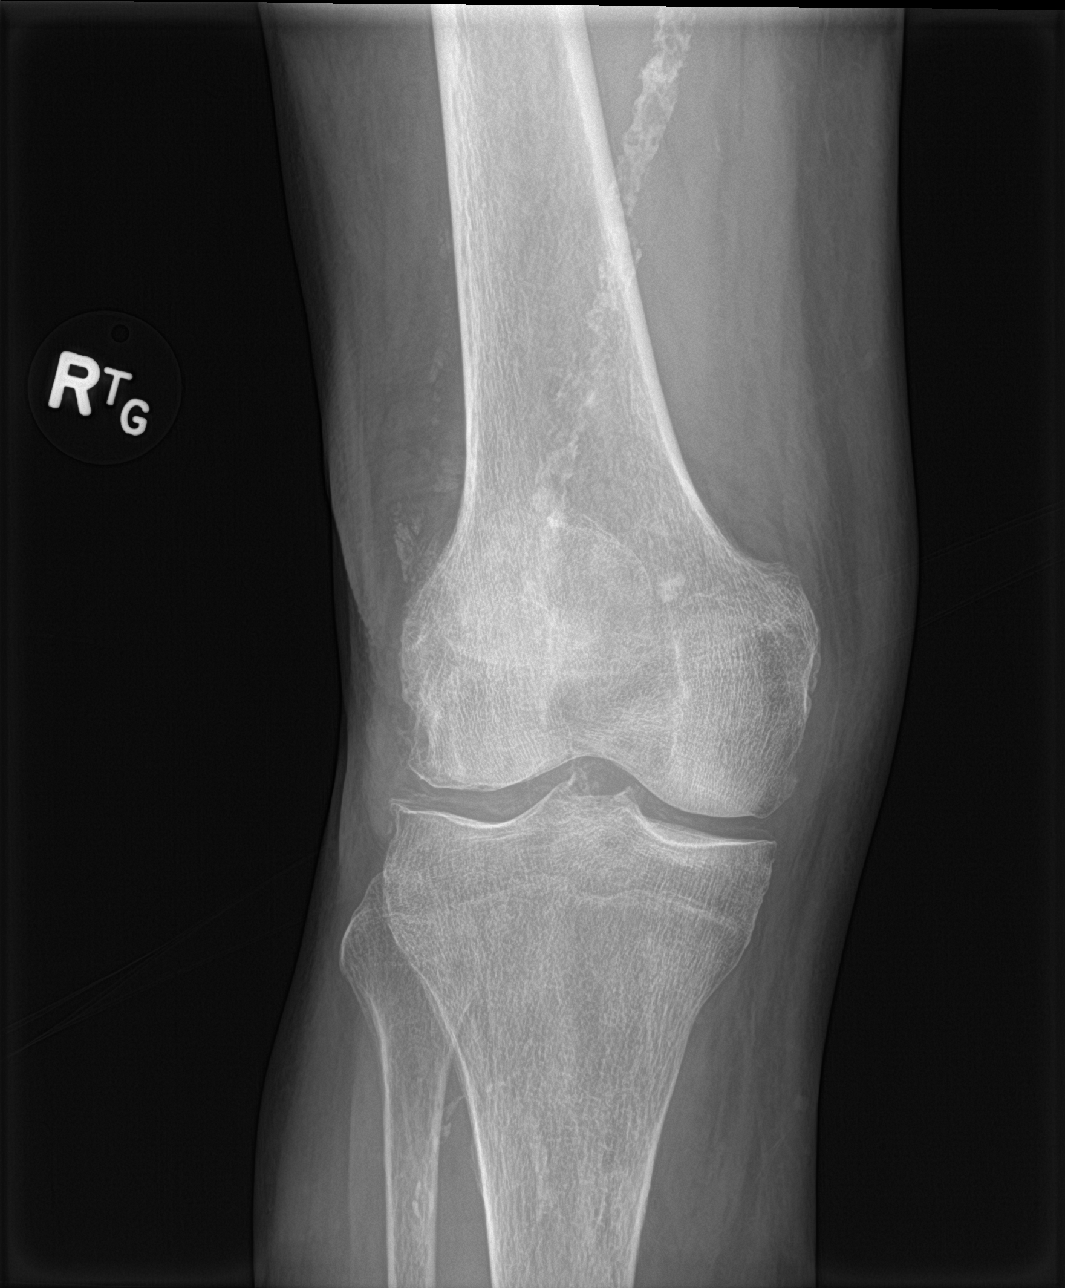

[knee lat]
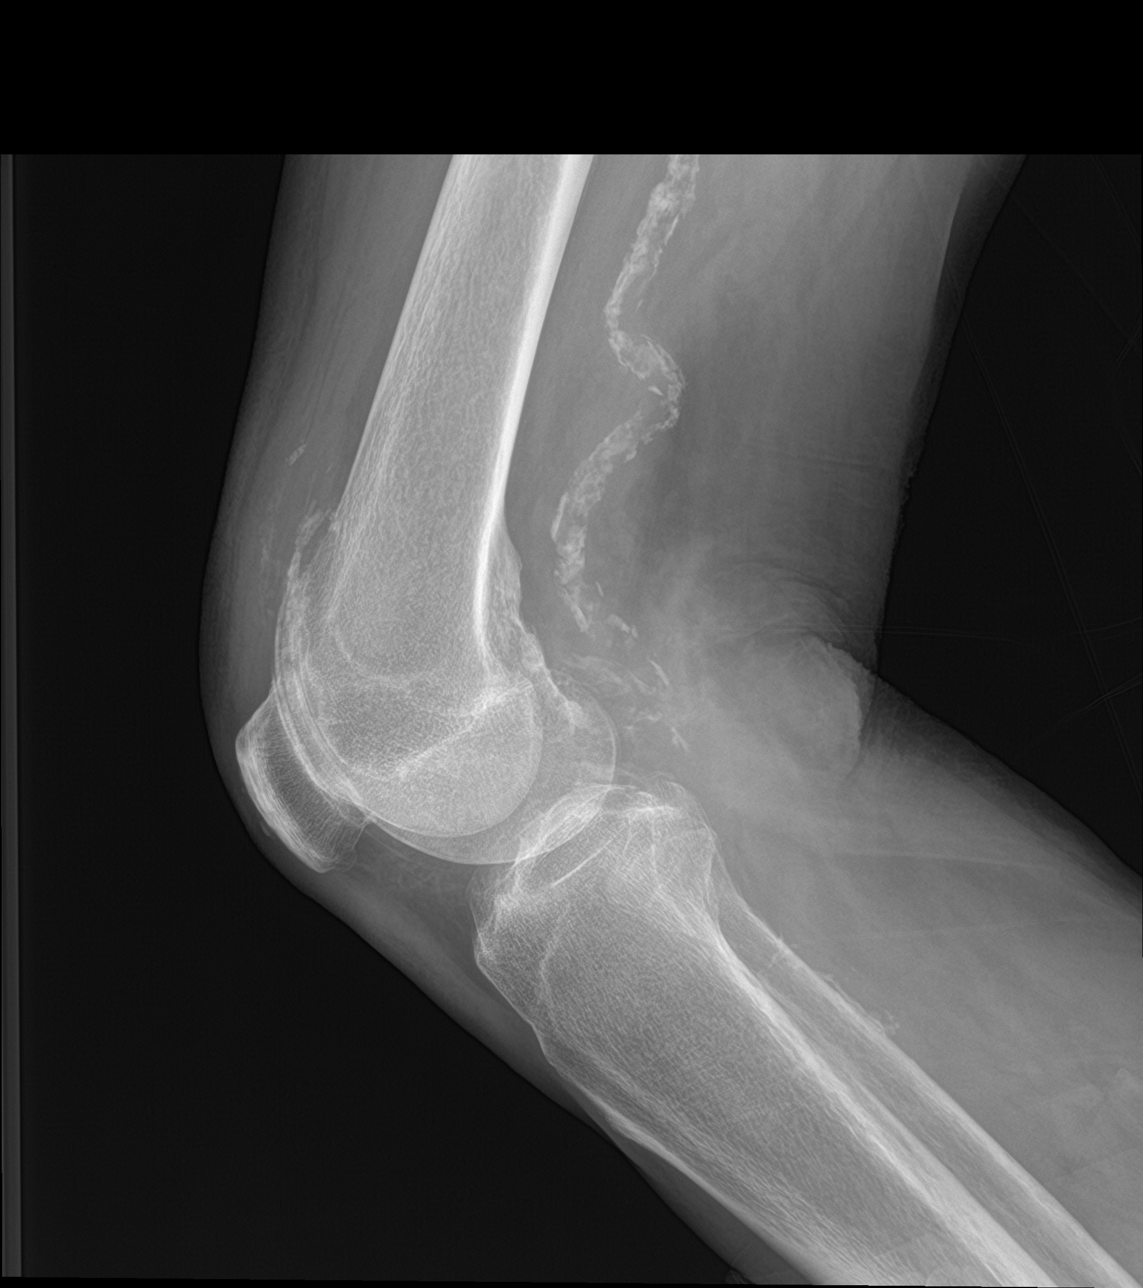

[knee obl (1 of 2)]
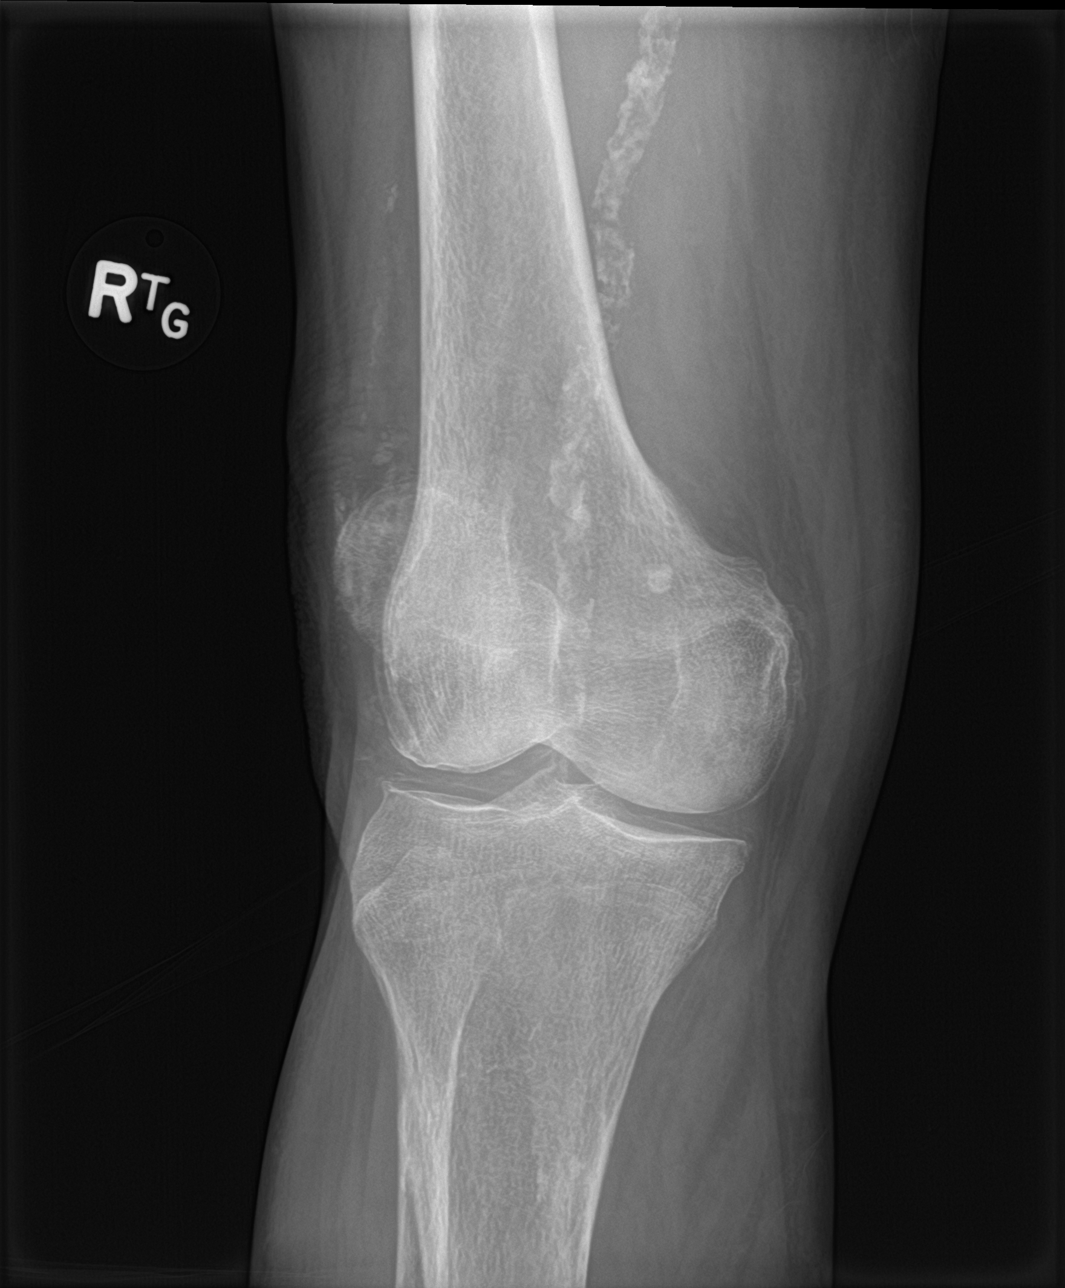

[knee obl (2 of 2)]
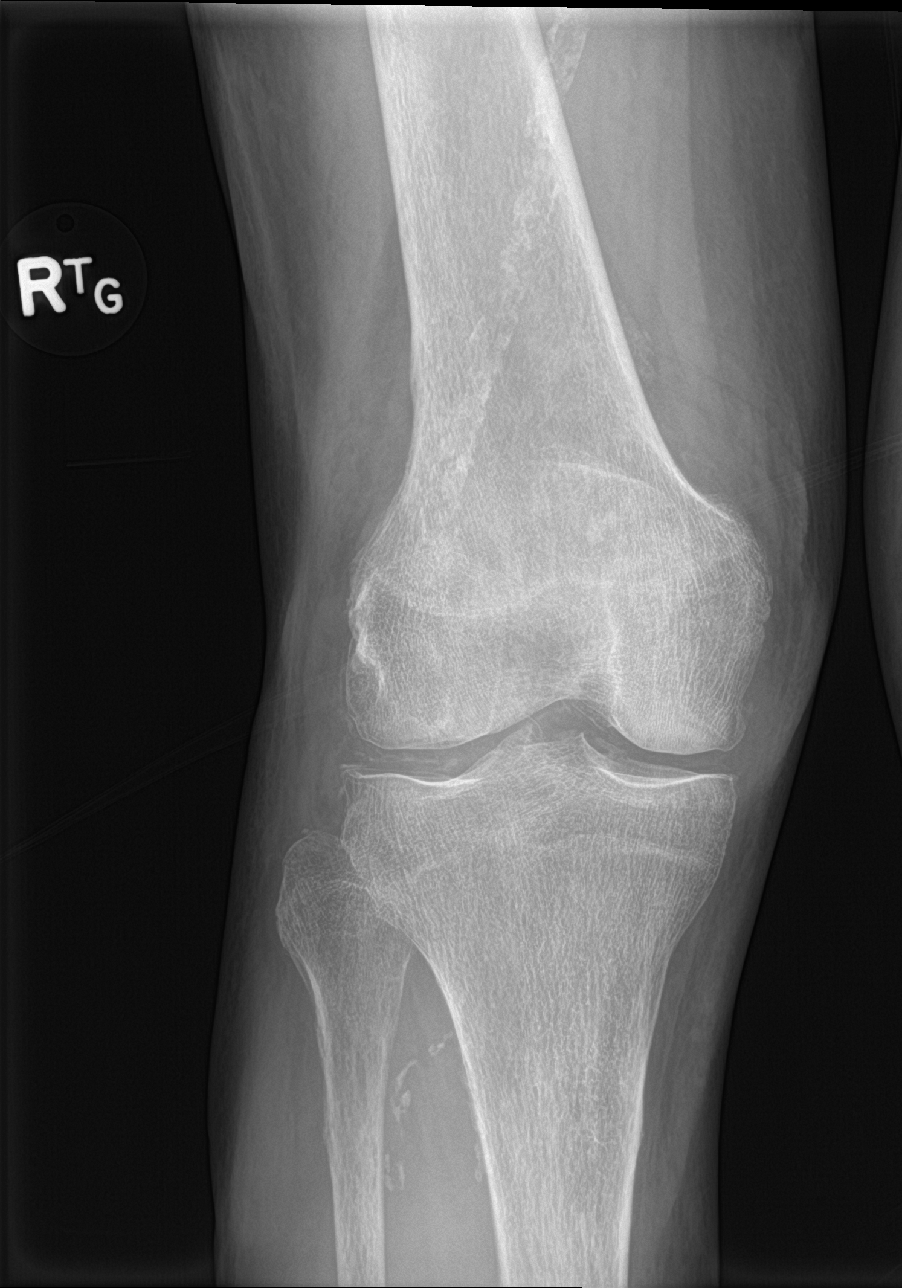

[4 of 4 positions shown; findings below may reference images not displayed]

FINDINGS: The bones are subjectively adequately mineralized. There is
narrowing of the patellofemoral joint space. There is
chondrocalcinosis of the menisci. There is beaking of the tibial
spines. Small spurs arise from the articular margins of the patella.
There is no acute fracture or dislocation. There is no joint
effusion.
IMPRESSION: Mild to moderate osteoarthritic change centered on the
patellofemoral compartment. Chondrocalcinosis of the menisci without
significant narrowing of the medial and lateral joint compartments.
